# Patient Record
Sex: Male | Born: 1975 | Race: Black or African American | Hispanic: No | Marital: Single | Smoking: Current every day smoker
Health system: Southern US, Community
[De-identification: ages and names within clinical notes are randomized; demographics above are authoritative.]

## PROBLEM LIST (undated history)

## (undated) DIAGNOSIS — F32A Depression, unspecified: Secondary | ICD-10-CM

## (undated) DIAGNOSIS — N189 Chronic kidney disease, unspecified: Secondary | ICD-10-CM

## (undated) DIAGNOSIS — M7622 Iliac crest spur, left hip: Secondary | ICD-10-CM

## (undated) DIAGNOSIS — E119 Type 2 diabetes mellitus without complications: Secondary | ICD-10-CM

## (undated) DIAGNOSIS — Q613 Polycystic kidney, unspecified: Secondary | ICD-10-CM

## (undated) DIAGNOSIS — M1990 Unspecified osteoarthritis, unspecified site: Secondary | ICD-10-CM

## (undated) DIAGNOSIS — J939 Pneumothorax, unspecified: Secondary | ICD-10-CM

## (undated) DIAGNOSIS — I10 Essential (primary) hypertension: Secondary | ICD-10-CM

## (undated) DIAGNOSIS — G4730 Sleep apnea, unspecified: Secondary | ICD-10-CM

## (undated) HISTORY — DX: Pneumothorax, unspecified: J93.9

## (undated) HISTORY — DX: Chronic kidney disease, unspecified: N18.9

---

## 2005-12-05 ENCOUNTER — Emergency Department (HOSPITAL_COMMUNITY): Admission: EM | Admit: 2005-12-05 | Discharge: 2005-12-05 | Payer: Self-pay | Admitting: Emergency Medicine

## 2006-12-20 ENCOUNTER — Emergency Department (HOSPITAL_COMMUNITY): Admission: EM | Admit: 2006-12-20 | Discharge: 2006-12-20 | Payer: Self-pay | Admitting: Emergency Medicine

## 2013-01-21 ENCOUNTER — Encounter (HOSPITAL_COMMUNITY): Payer: Self-pay | Admitting: *Deleted

## 2013-01-21 ENCOUNTER — Emergency Department (HOSPITAL_COMMUNITY)
Admission: EM | Admit: 2013-01-21 | Discharge: 2013-01-21 | Disposition: A | Discharge status: Home / Self Care | Payer: BC Managed Care – PPO | Attending: Emergency Medicine | Admitting: Emergency Medicine

## 2013-01-21 ENCOUNTER — Emergency Department (HOSPITAL_COMMUNITY): Payer: BC Managed Care – PPO

## 2013-01-21 DIAGNOSIS — J069 Acute upper respiratory infection, unspecified: Secondary | ICD-10-CM

## 2013-01-21 DIAGNOSIS — R42 Dizziness and giddiness: Secondary | ICD-10-CM

## 2013-01-21 DIAGNOSIS — F17200 Nicotine dependence, unspecified, uncomplicated: Secondary | ICD-10-CM | POA: Insufficient documentation

## 2013-01-21 DIAGNOSIS — J939 Pneumothorax, unspecified: Secondary | ICD-10-CM

## 2013-01-21 DIAGNOSIS — J9383 Other pneumothorax: Secondary | ICD-10-CM | POA: Insufficient documentation

## 2013-01-21 LAB — CBC WITH DIFFERENTIAL/PLATELET
Basophils Absolute: 0 10*3/uL (ref 0.0–0.1)
MCHC: 36.1 g/dL — ABNORMAL HIGH (ref 30.0–36.0)
MCV: 89.3 fL (ref 78.0–100.0)
Monocytes Relative: 5 % (ref 3–12)
RBC: 4.38 MIL/uL (ref 4.22–5.81)
RDW: 13.6 % (ref 11.5–15.5)
WBC: 8.9 10*3/uL (ref 4.0–10.5)

## 2013-01-21 LAB — BASIC METABOLIC PANEL
CO2: 20 mEq/L (ref 19–32)
Chloride: 105 mEq/L (ref 96–112)
Creatinine, Ser: 1.39 mg/dL — ABNORMAL HIGH (ref 0.50–1.35)
GFR calc non Af Amer: 64 mL/min — ABNORMAL LOW (ref >90)

## 2013-01-21 MED ORDER — SODIUM CHLORIDE 0.9 % IV BOLUS (SEPSIS)
1000.0000 mL | Freq: Once | INTRAVENOUS | Status: AC
  Administered 2013-01-21: 1000 mL via INTRAVENOUS

## 2013-01-21 MED ORDER — MECLIZINE HCL 50 MG PO TABS: 50.0000 mg | ORAL_TABLET | Freq: Three times a day (TID) | ORAL | Status: DC | PRN

## 2013-01-21 MED ORDER — ONDANSETRON HCL 4 MG/2ML IJ SOLN
4.0000 mg | Freq: Once | INTRAMUSCULAR | Status: AC
  Administered 2013-01-21: 4 mg via INTRAVENOUS
  Filled 2013-01-21: qty 2

## 2013-01-21 MED ORDER — MECLIZINE HCL 25 MG PO TABS
25.0000 mg | ORAL_TABLET | Freq: Once | ORAL | Status: AC
  Administered 2013-01-21: 25 mg via ORAL
  Filled 2013-01-21: qty 1

## 2013-01-21 MED ORDER — ONDANSETRON HCL 4 MG PO TABS: 4.0000 mg | ORAL_TABLET | Freq: Four times a day (QID) | ORAL | Status: DC

## 2013-01-21 NOTE — ED Notes (Signed)
Pt is here with chest congestion and dizziness.  Pt states dizziness started when he woke up.   NO pain

## 2013-01-21 NOTE — ED Provider Notes (Signed)
TIME SEEN: 11:24 AM  CHIEF COMPLAINT: Lightheadedness, vertigo, chest congestion  HPI: Patient is a 37 year old male with history of tobacco use who presents emergency department with 2-3 days of cough with green sputum and one day of dizziness. Patient reports that he's had subjective fevers but no chills, night sweats. He has been coughing up green sputum for the past several days. No wheezing, shortness of breath or chest pain. He reports he woke up this morning and when he got out of bed he felt very lightheaded and like the room was spinning. He vomited once. He denies any ear pain, hearing loss, tinnitus. No other numbness, tingling or focal weakness. He reports his lightheadedness and vertigo are intermittent and worse with change of position.  No history of CAD, PE or DVT, prior stroke or TIA.  ROS: See HPI Constitutional: Subjective fever  Eyes: no drainage  ENT: no runny nose   Cardiovascular:  no chest pain  Resp: no SOB  GI: no vomiting GU: no dysuria Integumentary: no rash  Allergy: no hives  Musculoskeletal: no leg swelling  Neurological: no slurred speech ROS otherwise negative  PAST MEDICAL HISTORY/PAST SURGICAL HISTORY:  History reviewed. No pertinent past medical history.  MEDICATIONS:  Prior to Admission medications   Not on File    ALLERGIES:  No Known Allergies  SOCIAL HISTORY:  History  Substance Use Topics  . Smoking status: Current Every Day Smoker  . Smokeless tobacco: Not on file  . Alcohol Use: No    FAMILY HISTORY: No family history on file.  EXAM: BP 153/90  Pulse 65  Temp(Src) 98 F (36.7 C) (Oral)  Resp 18  SpO2 97% CONSTITUTIONAL: Alert and oriented and responds appropriately to questions. Well-appearing; well-nourished, nontoxic HEAD: Normocephalic EYES: Conjunctivae clear, PERRL ENT: normal nose; no rhinorrhea; slightly dry mucous membranes; pharynx without lesions noted, TMs are clear bilaterally NECK: Supple, no meningismus,  no LAD  CARD: RRR; S1 and S2 appreciated; no murmurs, no clicks, no rubs, no gallops RESP: Normal chest excursion without splinting or tachypnea; breath sounds clear and equal bilaterally; no wheezes, no rhonchi, no rales,  ABD/GI: Normal bowel sounds; non-distended; soft, non-tender, no rebound, no guarding BACK:  The back appears normal and is non-tender to palpation, there is no CVA tenderness EXT: Normal ROM in all joints; non-tender to palpation; no edema; normal capillary refill; no cyanosis    SKIN: Normal color for age and race; warm NEURO: Moves all extremities equally, strength 5/5 in all 4 extremities, normal gait, cranial nerves II through XII intact, sensation to light touch intact diffusely, no nystagmus PSYCH: The patient's mood and manner are appropriate. Grooming and personal hygiene are appropriate.  MEDICAL DECISION MAKING: Patient with chest congestion, cough, lightheadedness and vertigo. Likely all caused by a viral illness. No concern for central vertigo as patient has no risk factors and his symptoms are worse with change of position. He does not currently have any vertiginous symptoms. He is hemodynamically stable. Will obtain basic labs to evaluate for anemia or electrolyte abnormality as the cause of his lightheadedness, chest x-ray to evaluate for infiltrate, give IV fluids, meclizine and Zofran. Anticipate discharge home with PCP followup.   Date: 01/21/2013 11:11 AM  Rate: 76  Rhythm: normal sinus rhythm  QRS Axis: normal  Intervals: normal  ST/T Wave abnormalities: normal  Conduction Disutrbances: none  Narrative Interpretation: Poor R-wave progression, no ischemic changes     ED PROGRESS: Discussed with radiology. Patient has a small 3-4% apical  pneumothorax on the right. He has no hypoxia or respiratory distress. Will discuss with cardiothoracic surgery for management. Anticipate outpatient followup.  Discussed with Dr. Prescott Gum with cardiothoracic surgery.  He will followup with patient and likely repeat chest x-ray in 3 days. Given patient has normal labs, no hypoxia or respiratory distress, I feel he is safe for discharge. Is likely a viral illness. I do not feel he needs antibiotics at this time. Have counseled patient on tobacco cessation. Given strict return precautions. We'll discharge home with meclizine for his intermittent peripheral vertigo. Patient verbalizes understanding is comfortable this plan.  [place], DO 01/21/13 1320

## 2013-01-21 NOTE — ED Notes (Signed)
Pt comfortable with d/c and f/u instructions. Prescriptions x2. 

## 2013-01-21 NOTE — ED Notes (Signed)
Pt is coughing up green sputum

## 2013-01-24 ENCOUNTER — Institutional Professional Consult (permissible substitution) (INDEPENDENT_AMBULATORY_CARE_PROVIDER_SITE_OTHER): Payer: BC Managed Care – PPO | Admitting: Cardiothoracic Surgery

## 2013-01-24 ENCOUNTER — Other Ambulatory Visit: Payer: Self-pay | Admitting: *Deleted

## 2013-01-24 ENCOUNTER — Ambulatory Visit
Admission: RE | Admit: 2013-01-24 | Discharge: 2013-01-24 | Disposition: A | Discharge status: Home / Self Care | Payer: BC Managed Care – PPO | Source: Ambulatory Visit | Attending: Cardiothoracic Surgery | Admitting: Cardiothoracic Surgery

## 2013-01-24 ENCOUNTER — Encounter: Payer: Self-pay | Admitting: Cardiothoracic Surgery

## 2013-01-24 VITALS — BP 177/105 | HR 95 | Resp 16 | Ht 70.0 in | Wt 314.0 lb

## 2013-01-24 DIAGNOSIS — J9383 Other pneumothorax: Secondary | ICD-10-CM

## 2013-01-24 DIAGNOSIS — J939 Pneumothorax, unspecified: Secondary | ICD-10-CM

## 2013-01-24 NOTE — Progress Notes (Signed)
PCP is No PCP Per Patient Referring Provider is Ward, Delice Bison, DO  Chief Complaint  Patient presents with  . Spontaneous Pneumothorax    right...f/u with cxr    HPI: 37 year old smoker returns for office followup after being evaluated earlier in the week for a spontaneous left pneumothorax less than 5%. He presented to the ED with complaints of a URI. No previous history of spontaneous pneumothorax. He smokes a pack a day. No history of drastic trauma.    No past surgical history on file.  No family history on file.  Social History History  Substance Use Topics  . Smoking status: Current Every Day Smoker  . Smokeless tobacco: Not on file  . Alcohol Use: No    Current Outpatient Prescriptions  Medication Sig Dispense Refill  . meclizine (ANTIVERT) 50 MG tablet Take 1 tablet (50 mg total) by mouth 3 (three) times daily as needed.  15 tablet  0  . ondansetron (ZOFRAN) 4 MG tablet Take 1 tablet (4 mg total) by mouth every 6 (six) hours.  12 tablet  0   No current facility-administered medications for this visit.    No Known Allergies  Review of Systems symptoms of URI are improving  BP 177/105  Pulse 95  Resp 16  Ht 5\' 10"  (1.778 m)  Wt 314 lb (142.429 kg)  BMI 45.05 kg/m2  SpO2 98% Physical Exam Alert and comfortable No crepitus in the neck Breath sounds clear and equal No chest tenderness Cardiac rhythm regular without murmur Neuro intact  Diagnostic Tests: Chest x-ray today shows resolution of left apical pneumothorax  Impression: Resolved left spontaneous apical pneumothorax  Plan: Patient was told not to do any heavy lifting or any exertional activity for one week. Patient was recommended to stop smoking completely. Return when necessary.

## 2016-02-28 ENCOUNTER — Observation Stay (HOSPITAL_COMMUNITY)
Admission: EM | Admit: 2016-02-28 | Discharge: 2016-02-29 | Disposition: A | Discharge status: Home / Self Care | Payer: 59 | Attending: Internal Medicine | Admitting: Internal Medicine

## 2016-02-28 ENCOUNTER — Encounter (HOSPITAL_COMMUNITY): Payer: Self-pay | Admitting: Emergency Medicine

## 2016-02-28 ENCOUNTER — Emergency Department (HOSPITAL_COMMUNITY): Payer: 59

## 2016-02-28 DIAGNOSIS — E785 Hyperlipidemia, unspecified: Secondary | ICD-10-CM | POA: Diagnosis not present

## 2016-02-28 DIAGNOSIS — Z79899 Other long term (current) drug therapy: Secondary | ICD-10-CM | POA: Diagnosis not present

## 2016-02-28 DIAGNOSIS — I129 Hypertensive chronic kidney disease with stage 1 through stage 4 chronic kidney disease, or unspecified chronic kidney disease: Secondary | ICD-10-CM | POA: Diagnosis not present

## 2016-02-28 DIAGNOSIS — F17210 Nicotine dependence, cigarettes, uncomplicated: Secondary | ICD-10-CM | POA: Diagnosis not present

## 2016-02-28 DIAGNOSIS — R079 Chest pain, unspecified: Secondary | ICD-10-CM | POA: Diagnosis present

## 2016-02-28 DIAGNOSIS — N189 Chronic kidney disease, unspecified: Secondary | ICD-10-CM | POA: Diagnosis not present

## 2016-02-28 DIAGNOSIS — I161 Hypertensive emergency: Secondary | ICD-10-CM

## 2016-02-28 DIAGNOSIS — Z6841 Body Mass Index (BMI) 40.0 and over, adult: Secondary | ICD-10-CM | POA: Insufficient documentation

## 2016-02-28 DIAGNOSIS — Z720 Tobacco use: Secondary | ICD-10-CM | POA: Diagnosis not present

## 2016-02-28 DIAGNOSIS — R0789 Other chest pain: Principal | ICD-10-CM | POA: Insufficient documentation

## 2016-02-28 DIAGNOSIS — N179 Acute kidney failure, unspecified: Secondary | ICD-10-CM | POA: Insufficient documentation

## 2016-02-28 DIAGNOSIS — I10 Essential (primary) hypertension: Secondary | ICD-10-CM

## 2016-02-28 HISTORY — DX: Essential (primary) hypertension: I10

## 2016-02-28 LAB — BASIC METABOLIC PANEL
Anion gap: 7 (ref 5–15)
BUN: 17 mg/dL (ref 6–20)
CALCIUM: 9.5 mg/dL (ref 8.9–10.3)
CO2: 22 mmol/L (ref 22–32)
CREATININE: 2.21 mg/dL — AB (ref 0.61–1.24)
Chloride: 111 mmol/L (ref 101–111)
GFR calc Af Amer: 41 mL/min — ABNORMAL LOW (ref >60)
GFR, EST NON AFRICAN AMERICAN: 35 mL/min — AB (ref >60)
GLUCOSE: 105 mg/dL — AB (ref 65–99)
Potassium: 4.1 mmol/L (ref 3.5–5.1)
SODIUM: 140 mmol/L (ref 135–145)

## 2016-02-28 LAB — CBC
HCT: 42.1 % (ref 39.0–52.0)
Hemoglobin: 14.5 g/dL (ref 13.0–17.0)
MCH: 30.6 pg (ref 26.0–34.0)
MCHC: 34.4 g/dL (ref 30.0–36.0)
MCV: 88.8 fL (ref 78.0–100.0)
PLATELETS: 255 10*3/uL (ref 150–400)
RBC: 4.74 MIL/uL (ref 4.22–5.81)
RDW: 13.8 % (ref 11.5–15.5)
WBC: 6.9 10*3/uL (ref 4.0–10.5)

## 2016-02-28 LAB — TROPONIN I
Troponin I: <0.03 ng/mL (ref <0.03)
Troponin I: <0.03 ng/mL (ref <0.03)

## 2016-02-28 LAB — GLUCOSE, CAPILLARY: Glucose-Capillary: 118 mg/dL — ABNORMAL HIGH (ref 65–99)

## 2016-02-28 LAB — I-STAT TROPONIN, ED
TROPONIN I, POC: 0.01 ng/mL (ref 0.00–0.08)
Troponin i, poc: 0.01 ng/mL (ref 0.00–0.08)

## 2016-02-28 MED ORDER — AMLODIPINE BESYLATE 5 MG PO TABS
10.0000 mg | ORAL_TABLET | Freq: Once | ORAL | Status: AC
  Administered 2016-02-28: 10 mg via ORAL
  Filled 2016-02-28: qty 2

## 2016-02-28 MED ORDER — ASPIRIN 81 MG PO CHEW
324.0000 mg | CHEWABLE_TABLET | Freq: Once | ORAL | Status: AC
  Administered 2016-02-28: 324 mg via ORAL
  Filled 2016-02-28: qty 4

## 2016-02-28 MED ORDER — SODIUM CHLORIDE 0.9 % IV BOLUS (SEPSIS)
1000.0000 mL | Freq: Once | INTRAVENOUS | Status: AC
  Administered 2016-02-28: 1000 mL via INTRAVENOUS

## 2016-02-28 MED ORDER — NICOTINE 21 MG/24HR TD PT24
21.0000 mg | MEDICATED_PATCH | Freq: Every day | TRANSDERMAL | Status: DC
  Administered 2016-02-28 – 2016-02-29 (×2): 21 mg via TRANSDERMAL
  Filled 2016-02-28 (×2): qty 1

## 2016-02-28 MED ORDER — ZOLPIDEM TARTRATE 5 MG PO TABS: 5.0000 mg | ORAL_TABLET | Freq: Every evening | ORAL | Status: DC | PRN

## 2016-02-28 MED ORDER — ACETAMINOPHEN 325 MG PO TABS
650.0000 mg | ORAL_TABLET | ORAL | Status: DC | PRN
  Administered 2016-02-28 (×2): 650 mg via ORAL
  Filled 2016-02-28 (×2): qty 2

## 2016-02-28 MED ORDER — ONDANSETRON HCL 4 MG/2ML IJ SOLN: 4.0000 mg | Freq: Four times a day (QID) | INTRAMUSCULAR | Status: DC | PRN

## 2016-02-28 MED ORDER — HYDRALAZINE HCL 20 MG/ML IJ SOLN: 5.0000 mg | Freq: Once | INTRAMUSCULAR | Status: AC

## 2016-02-28 MED ORDER — SODIUM CHLORIDE 0.9 % IV SOLN
INTRAVENOUS | Status: DC
  Administered 2016-02-28 – 2016-02-29 (×2): via INTRAVENOUS

## 2016-02-28 MED ORDER — HEPARIN SODIUM (PORCINE) 5000 UNIT/ML IJ SOLN
5000.0000 [IU] | Freq: Three times a day (TID) | INTRAMUSCULAR | Status: DC
  Administered 2016-02-28 – 2016-02-29 (×3): 5000 [IU] via SUBCUTANEOUS
  Filled 2016-02-28 (×3): qty 1

## 2016-02-28 MED ORDER — ALPRAZOLAM 0.25 MG PO TABS: 0.2500 mg | ORAL_TABLET | Freq: Two times a day (BID) | ORAL | Status: DC | PRN

## 2016-02-28 MED ORDER — HYDRALAZINE HCL 20 MG/ML IJ SOLN: 5.0000 mg | Freq: Three times a day (TID) | INTRAMUSCULAR | Status: DC | PRN

## 2016-02-28 MED ORDER — MORPHINE SULFATE (PF) 2 MG/ML IV SOLN
1.0000 mg | INTRAVENOUS | Status: DC | PRN
  Administered 2016-02-28: 1 mg via INTRAVENOUS
  Filled 2016-02-28: qty 1

## 2016-02-28 MED ORDER — GI COCKTAIL ~~LOC~~: 30.0000 mL | Freq: Four times a day (QID) | ORAL | Status: DC | PRN

## 2016-02-28 MED ORDER — NITROGLYCERIN 2 % TD OINT
1.0000 [in_us] | TOPICAL_OINTMENT | Freq: Once | TRANSDERMAL | Status: AC
  Administered 2016-02-28: 1 [in_us] via TOPICAL
  Filled 2016-02-28: qty 1

## 2016-02-28 NOTE — ED Notes (Signed)
Lab at bedside

## 2016-02-28 NOTE — ED Notes (Signed)
phlebotomy at bedside attempting to get blood.

## 2016-02-28 NOTE — H&P (Signed)
History and Physical    Kevin Todd DOB: 06/03/1975 DOA: 02/28/2016   PCP: No PCP Per Patient   Patient coming from:  Home  Chief Complaint: Chest Pain   HPI: Kevin Todd is a 40 y.o. male with medical history significant for cocaine abuse, HTN,  presenting with left parasternal chest pain since 9  Am, without radiation, 6/10. No radiation to the jaw or left arm.Pain notworsened with deep inspiration, movement or exertion. Till presentation to the ED, he had 1 episode without recurrence.  Patient took ASA 324 mg on arrival, and then given NTG x1 with some relief.  Denies any dizziness or falls. No syncope or presyncope.  Reports some acute on chronic dyspnea, worse on exertion. Denies any cough. Denies any fever or chills. Denies any nausea, vomiting or abdominal pain. Appetite is normal and eats salt rich, fatty foods. Denies any leg swelling or calf pain. Denies any headaches or vision changes. Denies any seizures No confusion reported.  Never seen by a cardiologist or had a cath. He does not have a family doctor. Only significant hearddisease history in his paternal grandfather, No recent long distance trips. Denies any new stressors. No new meds. Not on hormonal therapy.  No new herbal supplements.Smokes 1/2 ppd cigarettes. Denies cocaine or meth. Drinks socially.    ED Course:  BP 127/88   Pulse 72   Temp 98 F (36.7 C) (Oral)   Resp 20   Ht 5' 8"  (1.727 m)   Wt 134.7 kg (297 lb)   SpO2 91%   BMI 45.16 kg/m      Review of Systems: As per HPI otherwise 10 point review of systems negative.   Past Medical History:  Diagnosis Date  . Hypertension   . Pneumothorax on right     History reviewed. No pertinent surgical history.  Social History Social History   Social History  . Marital status: Single    Spouse name: N/A  . Number of children: N/A  . Years of education: N/A   Occupational History  . Not on file.   Social History Main Topics  .  Smoking status: Current Every Day Smoker  . Smokeless tobacco: Never Used  . Alcohol use Yes  . Drug use: No  . Sexual activity: Not on file   Other Topics Concern  . Not on file   Social History Narrative  . No narrative on file     No Known Allergies  History reviewed. No pertinent family history.    Prior to Admission medications   Medication Sig Start Date End Date Taking? Authorizing Provider  meclizine (ANTIVERT) 50 MG tablet Take 1 tablet (50 mg total) by mouth 3 (three) times daily as needed. Patient not taking: Reported on 02/28/2016 01/21/13   Kristen N Ward, DO  ondansetron (ZOFRAN) 4 MG tablet Take 1 tablet (4 mg total) by mouth every 6 (six) hours. Patient not taking: Reported on 02/28/2016 01/21/13   Delice Bison Ward, DO    Physical Exam:    Vitals:   02/28/16 1345 02/28/16 1415 02/28/16 1430 02/28/16 1445  BP: 133/92 133/70 121/79 127/88  Pulse: 61 66 83 72  Resp: 18 16 20 20   Temp:      TempSrc:      SpO2: (!) 87% 94% (!) 88% 91%  Weight:      Height:           Constitutional: NAD, calm, comfortable  Vitals:   02/28/16 1345 02/28/16  1415 02/28/16 1430 02/28/16 1445  BP: 133/92 133/70 121/79 127/88  Pulse: 61 66 83 72  Resp: 18 16 20 20   Temp:      TempSrc:      SpO2: (!) 87% 94% (!) 88% 91%  Weight:      Height:       Eyes: PERRL, lids and conjunctivae normal ENMT: Mucous membranes are moist. Posterior pharynx clear of any exudate or lesions.Normal dentition.  Neck: normal, supple, no masses, no thyromegaly Respiratory: clear to auscultation bilaterally, no wheezing, no crackles. Normal respiratory effort. No accessory muscle use.  Cardiovascular: Regular rate and rhythm, soft 1/6 systolic murmurs / rubs / gallops. Trace lower bilateral  extremity edema. 2+ pedal pulses. No carotid bruits.  Abdomen: morbidly obese no tenderness, no masses palpated. No hepatosplenomegaly. Bowel sounds positive.  Musculoskeletal: no clubbing / cyanosis. No joint  deformity upper and lower extremities. Good ROM, no contractures. Normal muscle tone.  Skin: no rashes, lesions, ulcers.  Neurologic: CN 2-12 grossly intact. Sensation intact, DTR normal. Strength 5/5 in all 4.  Psychiatric: Normal judgment and insight. Alert and oriented x 3. Normal mood.     Labs on Admission: I have personally reviewed following labs and imaging studies  CBC:  Recent Labs Lab 02/28/16 0821  WBC 6.9  HGB 14.5  HCT 42.1  MCV 88.8  PLT 010    Basic Metabolic Panel:  Recent Labs Lab 02/28/16 0821  NA 140  K 4.1  CL 111  CO2 22  GLUCOSE 105*  BUN 17  CREATININE 2.21*  CALCIUM 9.5    GFR: Estimated Creatinine Clearance: 59.6 mL/min (by C-G formula based on SCr of 2.21 mg/dL (H)).  Liver Function Tests: No results for input(s): AST, ALT, ALKPHOS, BILITOT, PROT, ALBUMIN in the last 168 hours. No results for input(s): LIPASE, AMYLASE in the last 168 hours. No results for input(s): AMMONIA in the last 168 hours.  Coagulation Profile: No results for input(s): INR, PROTIME in the last 168 hours.  Cardiac Enzymes: No results for input(s): CKTOTAL, CKMB, CKMBINDEX, TROPONINI in the last 168 hours.  BNP (last 3 results) No results for input(s): PROBNP in the last 8760 hours.  HbA1C: No results for input(s): HGBA1C in the last 72 hours.  CBG: No results for input(s): GLUCAP in the last 168 hours.  Lipid Profile: No results for input(s): CHOL, HDL, LDLCALC, TRIG, CHOLHDL, LDLDIRECT in the last 72 hours.  Thyroid Function Tests: No results for input(s): TSH, T4TOTAL, FREET4, T3FREE, THYROIDAB in the last 72 hours.  Anemia Panel: No results for input(s): VITAMINB12, FOLATE, FERRITIN, TIBC, IRON, RETICCTPCT in the last 72 hours.  Urine analysis: No results found for: COLORURINE, APPEARANCEUR, LABSPEC, PHURINE, GLUCOSEU, HGBUR, BILIRUBINUR, KETONESUR, PROTEINUR, UROBILINOGEN, NITRITE, LEUKOCYTESUR  Sepsis  Labs: @LABRCNTIP (procalcitonin:4,lacticidven:4) )No results found for this or any previous visit (from the past 240 hour(s)).   Radiological Exams on Admission: Dg Chest 2 View  Result Date: 02/28/2016 CLINICAL DATA:  Chest pain.  Shortness of breath . EXAM: CHEST  2 VIEW COMPARISON:  01/24/2013. FINDINGS: The heart size and mediastinal contours are within normal limits. Both lungs are clear. No acute bony abnormality. IMPRESSION: No active cardiopulmonary disease. Electronically Signed   By: Marcello Moores  Register   On: 02/28/2016 08:37    EKG: Independently reviewed.  Assessment/Plan Active Problems:   Chest pain   Acute kidney injury ([facility])   Morbid obesity ([facility])   Tobacco abuse     Chest pain syndrome  cardiac versus musculoskeletal  HEART score 3-4. Troponin 0.03, EKG without evidence of ACS. CP relieved by nitroglycerin x1,  Received aspirin x1. CXR unrevealing.  He is CP free at this time   Admit to Telemetry/ Observation Chest pain order set Cycle troponins EKG in am continue ASA, O2 and NTG as needed GI cocktail Check Lipid panel  Hb A1C Will need OP follow up when discharged, no PCP .Once labs return, including A1C and Lipid panel, will likely need to start on meds    AKI versus Acute on Chronic kidney disease stage 3  EGFR 41   Current Cr 2.21, unable to obtain a baseline Cr. Last lab value on 01/2013 1.39  Lab Results  Component Value Date   CREATININE 2.21 (H) 02/28/2016   CREATININE 1.39 (H) 01/21/2013  IVF Repeat CMET in am  Tobacco abuse  -  Counseled cessation PAtient declines patch at this time    DVT prophylaxis:  Heparin  Code Status:   Full   Family Communication:  Discussed with patient    Disposition Plan: Expect patient to be discharged to home after condition improves Consults called:    None Admission status:Tele  Obs  Mayes Sangiovanni E, PA-C [facility]   If 7PM-7AM, please contact night-coverage [email or web address] Password  [email or web address]  02/28/2016, 2:55 PM

## 2016-02-28 NOTE — ED Triage Notes (Signed)
Pt states he woke up with chest pressure. Pt denies N/V. Pt is feeling lightheaded.

## 2016-02-28 NOTE — ED Notes (Signed)
RN attempted to place IV since patient removed unsuccessful x2

## 2016-02-28 NOTE — ED Notes (Signed)
Spoke with PA about Hydralazine advised hold at this time.

## 2016-02-28 NOTE — ED Provider Notes (Signed)
[facility] DEPT Provider Note   CSN: [id number] Arrival date & time: 02/28/16  0803     History   Chief Complaint Chief Complaint  Patient presents with  . Chest Pain    HPI  Kevin Todd is a 40 y.o. male c/o Sided nonradiating chest pain described as tightness, waxing and waning onset this morning while he was at rest, 7 out of 10 at worst, 4 out of 10 right now. Denies exacerbation with exertion, nonpleuritic, non-positional. Associated with mild nausea, no emesis, diaphoresis, lightheadedness, syncope, calf pain, leg swelling, recent cocaine and methamphetamine use, family history of ACS or early cardiac death, cough, fever, chills, history of DVT/PE, recent immobilizations, history of cancer.   Pt has not received any ASA or NTG in the last 24 hours.  RF: Current smoker with 20-pack-year history, obesity, untreated hypertension, no primary care unknown diabetes, hyperlipidemia Cardiologist:  ? PCP: ?   HPI  Past Medical History:  Diagnosis Date  . Hypertension   . Pneumothorax on right     Patient Active Problem List   Diagnosis Date Noted  . Chest pain 02/28/2016  . Spontaneous pneumothorax 01/24/2013    History reviewed. No pertinent surgical history.     Home Medications    Prior to Admission medications   Medication Sig Start Date End Date Taking? Authorizing Provider  meclizine (ANTIVERT) 50 MG tablet Take 1 tablet (50 mg total) by mouth 3 (three) times daily as needed. Patient not taking: Reported on 02/28/2016 01/21/13   Kristen N Ward, DO  ondansetron (ZOFRAN) 4 MG tablet Take 1 tablet (4 mg total) by mouth every 6 (six) hours. Patient not taking: Reported on 02/28/2016 01/21/13   [place], DO    Family History History reviewed. No pertinent family history.  Social History Social History  Substance Use Topics  . Smoking status: Current Every Day Smoker  . Smokeless tobacco: Never Used  . Alcohol use Yes     Allergies   Review of  patient's allergies indicates no known allergies.   Review of Systems Review of Systems  10 systems reviewed and found to be negative, except as noted in the HPI.   Physical Exam Updated Vital Signs BP 141/77   Pulse 61   Temp 98 F (36.7 C) (Oral)   Resp 17   Ht 5\' 8"  (1.727 m)   Wt 134.7 kg   SpO2 97%   BMI 45.16 kg/m   Physical Exam  Constitutional: He is oriented to person, place, and time. He appears well-developed and well-nourished. No distress.  HENT:  Head: Normocephalic and atraumatic.  Mouth/Throat: Oropharynx is clear and moist.  Eyes: Conjunctivae and EOM are normal. Pupils are equal, round, and reactive to light.  Neck: Normal range of motion. No JVD present. No tracheal deviation present.  Cardiovascular: Normal rate, regular rhythm and intact distal pulses.   BP 165/116  Radial pulse equal bilaterally  Pulmonary/Chest: Effort normal and breath sounds normal. No stridor. No respiratory distress. He has no wheezes. He has no rales. He exhibits no tenderness.  Abdominal: Soft. He exhibits no distension and no mass. There is no tenderness. There is no rebound and no guarding.  Musculoskeletal: Normal range of motion. He exhibits no edema or tenderness.  No calf asymmetry, superficial collaterals, palpable cords, edema, Homans sign negative bilaterally.    Neurological: He is alert and oriented to person, place, and time. Abnormal reflex: .npmdm.  Skin: Skin is warm. He is not diaphoretic.  Psychiatric: He has a normal mood and affect.  Nursing note and vitals reviewed.    ED Treatments / Results  Labs (all labs ordered are listed, but only abnormal results are displayed) Labs Reviewed  BASIC METABOLIC PANEL - Abnormal; Notable for the following:       Result Value   Glucose, Bld 105 (*)    Creatinine, Ser 2.21 (*)    GFR calc non Af Amer 35 (*)    GFR calc Af Amer 41 (*)    All other components within normal limits  CBC  I-STAT TROPOININ, ED    I-STAT TROPOININ, ED    EKG  EKG Interpretation  Date/Time:  Monday February 28 2016 08:10:33 EDT Ventricular Rate:  86 PR Interval:  132 QRS Duration: 80 QT Interval:  346 QTC Calculation: 414 R Axis:   105 Text Interpretation:  Sinus rhythm with marked sinus arrhythmia Rightward axis No significant change since last tracing Confirmed by Alvino Chapel  MD, NATHAN [phone]) on 02/28/2016 10:20:47 AM       Radiology Dg Chest 2 View  Result Date: 02/28/2016 CLINICAL DATA:  Chest pain.  Shortness of breath . EXAM: CHEST  2 VIEW COMPARISON:  01/24/2013. FINDINGS: The heart size and mediastinal contours are within normal limits. Both lungs are clear. No acute bony abnormality. IMPRESSION: No active cardiopulmonary disease. Electronically Signed   By: Marcello Moores  Register   On: 02/28/2016 08:37    Procedures Procedures (including critical care time)  Medications Ordered in ED Medications  nicotine (NICODERM CQ - dosed in mg/24 hours) patch 21 mg (21 mg Transdermal Patch Applied 02/28/16 1154)  hydrALAZINE (APRESOLINE) injection 5 mg (not administered)  nitroGLYCERIN (NITROGLYN) 2 % ointment 1 inch (1 inch Topical Given 02/28/16 1118)  aspirin chewable tablet 324 mg (324 mg Oral Given 02/28/16 1117)  amLODipine (NORVASC) tablet 10 mg (10 mg Oral Given 02/28/16 1117)  sodium chloride 0.9 % bolus 1,000 mL (0 mLs Intravenous Stopped 02/28/16 1243)     Initial Impression / Assessment and Plan / ED Course  I have reviewed the triage vital signs and the nursing notes.  Pertinent labs & imaging results that were available during my care of the patient were reviewed by me and considered in my medical decision making (see chart for details).  Clinical Course   Vitals:   02/28/16 1200 02/28/16 1215 02/28/16 1230 02/28/16 1245  BP: 140/87 141/85 134/92 141/77  Pulse: (!) 55 (!) 58 60 61  Resp: 17 19 16 17   Temp:      TempSrc:      SpO2: 96% 96% 97% 97%  Weight:      Height:         Medications  nicotine (NICODERM CQ - dosed in mg/24 hours) patch 21 mg (21 mg Transdermal Patch Applied 02/28/16 1154)  hydrALAZINE (APRESOLINE) injection 5 mg (not administered)  nitroGLYCERIN (NITROGLYN) 2 % ointment 1 inch (1 inch Topical Given 02/28/16 1118)  aspirin chewable tablet 324 mg (324 mg Oral Given 02/28/16 1117)  amLODipine (NORVASC) tablet 10 mg (10 mg Oral Given 02/28/16 1117)  sodium chloride 0.9 % bolus 1,000 mL (0 mLs Intravenous Stopped 02/28/16 1243)    Kevin Todd is 40 y.o. male presenting with Left-sided nonradiating chest pain described as tightness, intermittent waxing and waning in no exacerbating or alleviating factors identified. Patient's blood pressure is elevated, does not have a primary care physician, multiple cardiac risk factors. EKG reassuring with just a right axis deviation, troponin negative, creatinine  2.2, no recent for comparison, may be secondary to chronic uncontrolled hypertension versus a hypertensive urgency with end organ damage. Will obtain delta troponin, give amlodipine for blood pressure control and reassess. Extensive discussion on smoking cessation, lifestyle changes and importance of following with PCP for hypertension. This patient is insured, recommend that he see [facility].  Patient's chest pain remains 5/10 will need admission for possible end organ damage from elevated blood pressure.  Discussed with APP [facility] who accepts admission with Dr. Marily Memos.   Final Clinical Impressions(s) / ED Diagnoses   Final diagnoses:  Chest pain, unspecified type  Uncontrolled hypertension  Tobacco use  Chronic kidney disease, unspecified CKD stage    New Prescriptions New Prescriptions   No medications on file     Monico Blitz, PA-C 02/28/16 [place], MD 02/28/16 1610

## 2016-02-29 ENCOUNTER — Observation Stay (HOSPITAL_BASED_OUTPATIENT_CLINIC_OR_DEPARTMENT_OTHER): Payer: 59

## 2016-02-29 DIAGNOSIS — F17210 Nicotine dependence, cigarettes, uncomplicated: Secondary | ICD-10-CM | POA: Diagnosis not present

## 2016-02-29 DIAGNOSIS — N189 Chronic kidney disease, unspecified: Secondary | ICD-10-CM | POA: Diagnosis not present

## 2016-02-29 DIAGNOSIS — I200 Unstable angina: Secondary | ICD-10-CM

## 2016-02-29 DIAGNOSIS — R079 Chest pain, unspecified: Secondary | ICD-10-CM

## 2016-02-29 DIAGNOSIS — N179 Acute kidney failure, unspecified: Secondary | ICD-10-CM | POA: Diagnosis not present

## 2016-02-29 DIAGNOSIS — R0789 Other chest pain: Secondary | ICD-10-CM | POA: Diagnosis not present

## 2016-02-29 DIAGNOSIS — I129 Hypertensive chronic kidney disease with stage 1 through stage 4 chronic kidney disease, or unspecified chronic kidney disease: Secondary | ICD-10-CM | POA: Diagnosis not present

## 2016-02-29 LAB — RAPID URINE DRUG SCREEN, HOSP PERFORMED
Amphetamines: NOT DETECTED
BENZODIAZEPINES: NOT DETECTED
Barbiturates: NOT DETECTED
COCAINE: NOT DETECTED
OPIATES: NOT DETECTED
TETRAHYDROCANNABINOL: POSITIVE — AB

## 2016-02-29 LAB — COMPREHENSIVE METABOLIC PANEL
ALT: 22 U/L (ref 17–63)
AST: 19 U/L (ref 15–41)
Albumin: 3.8 g/dL (ref 3.5–5.0)
Alkaline Phosphatase: 58 U/L (ref 38–126)
Anion gap: 7 (ref 5–15)
BILIRUBIN TOTAL: 0.7 mg/dL (ref 0.3–1.2)
BUN: 13 mg/dL (ref 6–20)
CO2: 22 mmol/L (ref 22–32)
CREATININE: 1.96 mg/dL — AB (ref 0.61–1.24)
Calcium: 9.3 mg/dL (ref 8.9–10.3)
Chloride: 111 mmol/L (ref 101–111)
GFR, EST AFRICAN AMERICAN: 48 mL/min — AB (ref >60)
GFR, EST NON AFRICAN AMERICAN: 41 mL/min — AB (ref >60)
Glucose, Bld: 119 mg/dL — ABNORMAL HIGH (ref 65–99)
Potassium: 4.1 mmol/L (ref 3.5–5.1)
Sodium: 140 mmol/L (ref 135–145)
TOTAL PROTEIN: 6.7 g/dL (ref 6.5–8.1)

## 2016-02-29 LAB — ECHOCARDIOGRAM COMPLETE
Height: 68 in
Weight: 4752 oz

## 2016-02-29 LAB — TSH: TSH: 1.95 u[IU]/mL (ref 0.350–4.500)

## 2016-02-29 LAB — LIPID PANEL
CHOLESTEROL: 211 mg/dL — AB (ref 0–200)
HDL: 33 mg/dL — ABNORMAL LOW (ref >40)
LDL Cholesterol: 146 mg/dL — ABNORMAL HIGH (ref 0–99)
Total CHOL/HDL Ratio: 6.4 RATIO
Triglycerides: 162 mg/dL — ABNORMAL HIGH (ref <150)
VLDL: 32 mg/dL (ref 0–40)

## 2016-02-29 LAB — HEMOGLOBIN A1C
Hgb A1c MFr Bld: 5.8 % — ABNORMAL HIGH (ref 4.8–5.6)
MEAN PLASMA GLUCOSE: 120 mg/dL

## 2016-02-29 LAB — LIPASE, BLOOD: LIPASE: 27 U/L (ref 11–51)

## 2016-02-29 LAB — D-DIMER, QUANTITATIVE (NOT AT ARMC)

## 2016-02-29 MED ORDER — AMLODIPINE BESYLATE 10 MG PO TABS: 10.0000 mg | ORAL_TABLET | Freq: Every day | ORAL | 1 refills | Status: DC

## 2016-02-29 MED ORDER — NICOTINE 21 MG/24HR TD PT24: 21.0000 mg | MEDICATED_PATCH | Freq: Every day | TRANSDERMAL | 0 refills | Status: DC

## 2016-02-29 MED ORDER — ATORVASTATIN CALCIUM 20 MG PO TABS: 20.0000 mg | ORAL_TABLET | Freq: Every day | ORAL | 1 refills | Status: DC

## 2016-02-29 NOTE — Discharge Summary (Signed)
Physician Discharge Summary  Kevin Todd MRN: [id number] DOB/AGE: 05/30/1975 40 y.o.  PCP: No PCP Per Patient   Admit date: 02/28/2016 Discharge date: 02/29/2016  Discharge Diagnoses:    Active Problems:   Chest pain   Acute kidney injury ([place])   Morbid obesity ([place])   Tobacco abuse   Hypertensive emergency    Follow-up recommendations Follow-up with PCP in 3-5 days , including all  additional recommended appointments as below Follow-up CBC, CMP in 3-5 days Patient to establish with primary care provider , closely follow renal function in the outpatient setting       Current Discharge Medication List    START taking these medications   Details  amLODipine (NORVASC) 10 MG tablet Take 1 tablet (10 mg total) by mouth daily. Qty: 30 tablet, Refills: 1    atorvastatin (LIPITOR) 20 MG tablet Take 1 tablet (20 mg total) by mouth daily. Qty: 30 tablet, Refills: 1    nicotine (NICODERM CQ - DOSED IN MG/24 HOURS) 21 mg/24hr patch Place 1 patch (21 mg total) onto the skin daily. Qty: 28 patch, Refills: 0      CONTINUE these medications which have NOT CHANGED   Details  meclizine (ANTIVERT) 50 MG tablet Take 1 tablet (50 mg total) by mouth 3 (three) times daily as needed. Qty: 15 tablet, Refills: 0    ondansetron (ZOFRAN) 4 MG tablet Take 1 tablet (4 mg total) by mouth every 6 (six) hours. Qty: 12 tablet, Refills: 0         Discharge Condition: *Stable   Discharge Instructions Get Medicines reviewed and adjusted: Please take all your medications with you for your next visit with your Primary MD  Please request your Primary MD to go over all hospital tests and procedure/radiological results at the follow up, please ask your Primary MD to get all Hospital records sent to his/her office.  If you experience worsening of your admission symptoms, develop shortness of breath, life threatening emergency, suicidal or homicidal thoughts you must seek medical attention  immediately by calling 911 or calling your MD immediately if symptoms less severe.  You must read complete instructions/literature along with all the possible adverse reactions/side effects for all the Medicines you take and that have been prescribed to you. Take any new Medicines after you have completely understood and accpet all the possible adverse reactions/side effects.   Do not drive when taking Pain medications.   Do not take more than prescribed Pain, Sleep and Anxiety Medications  Special Instructions: If you have smoked or chewed Tobacco in the last 2 yrs please stop smoking, stop any regular Alcohol and or any Recreational drug use.  Wear Seat belts while driving.  Please note  You were cared for by a hospitalist during your hospital stay. Once you are discharged, your primary care physician will handle any further medical issues. Please note that NO REFILLS for any discharge medications will be authorized once you are discharged, as it is imperative that you return to your primary care physician (or establish a relationship with a primary care physician if you do not have one) for your aftercare needs so that they can reassess your need for medications and monitor your lab values.     No Known Allergies    Disposition: 01-Home or Self Care   Consults:  None     Significant Diagnostic Studies:  Dg Chest 2 View  Result Date: 02/28/2016 CLINICAL DATA:  Chest pain.  Shortness of breath . EXAM:  CHEST  2 VIEW COMPARISON:  01/24/2013. FINDINGS: The heart size and mediastinal contours are within normal limits. Both lungs are clear. No acute bony abnormality. IMPRESSION: No active cardiopulmonary disease. Electronically Signed   By: Maisie Fus  Register   On: 02/28/2016 08:37    echocardiogram  LV EF: 60% -   65%  ------------------------------------------------------------------- Indications:      Chest pain  786.51.  ------------------------------------------------------------------- History:   PMH:  Substance Abuse.  Risk factors:  Hypertension.  ------------------------------------------------------------------- Study Conclusions  - Left ventricle: The cavity size was normal. There was severe   focal basal hypertrophy. Systolic function was normal. The   estimated ejection fraction was in the range of 60% to 65%. Wall   motion was normal; there were no regional wall motion   abnormalities. Left ventricular diastolic function parameters   were normal. - Mitral valve: Calcified annulus. - Pulmonic valve: There was trivial regurgitation.     Filed Weights   02/28/16 0810 02/28/16 1552  Weight: 134.7 kg (297 lb) 134.7 kg (297 lb)     Microbiology: No results found for this or any previous visit (from the past 240 hour(s)).     Blood Culture No results found for: SDES, SPECREQUEST, CULT, REPTSTATUS    Labs: Results for orders placed or performed during the hospital encounter of 02/28/16 (from the past 48 hour(s))  Urine rapid drug screen (hosp performed)     Status: Abnormal   Collection Time: 02/28/16  3:15 AM  Result Value Ref Range   Opiates NONE DETECTED NONE DETECTED   Cocaine NONE DETECTED NONE DETECTED   Benzodiazepines NONE DETECTED NONE DETECTED   Amphetamines NONE DETECTED NONE DETECTED   Tetrahydrocannabinol POSITIVE (A) NONE DETECTED   Barbiturates NONE DETECTED NONE DETECTED    Comment:        DRUG SCREEN FOR MEDICAL PURPOSES ONLY.  IF CONFIRMATION IS NEEDED FOR ANY PURPOSE, NOTIFY LAB WITHIN 5 DAYS.        LOWEST DETECTABLE LIMITS FOR URINE DRUG SCREEN Drug Class       Cutoff (ng/mL) Amphetamine      1000 Barbiturate      200 Benzodiazepine   200 Tricyclics       300 Opiates          300 Cocaine          300 THC              50   Basic metabolic panel     Status: Abnormal   Collection Time: 02/28/16  8:21 AM  Result Value Ref Range   Sodium  140 135 - 145 mmol/L   Potassium 4.1 3.5 - 5.1 mmol/L   Chloride 111 101 - 111 mmol/L   CO2 22 22 - 32 mmol/L   Glucose, Bld 105 (H) 65 - 99 mg/dL   BUN 17 6 - 20 mg/dL   Creatinine, Ser 1.48 (H) 0.61 - 1.24 mg/dL   Calcium 9.5 8.9 - 65.4 mg/dL   GFR calc non Af Amer 35 (L) >60 mL/min   GFR calc Af Amer 41 (L) >60 mL/min    Comment: (NOTE) The eGFR has been calculated using the CKD EPI equation. This calculation has not been validated in all clinical situations. eGFR's persistently <60 mL/min signify possible Chronic Kidney Disease.    Anion gap 7 5 - 15  CBC     Status: None   Collection Time: 02/28/16  8:21 AM  Result Value Ref Range   WBC 6.9  4.0 - 10.5 K/uL   RBC 4.74 4.22 - 5.81 MIL/uL   Hemoglobin 14.5 13.0 - 17.0 g/dL   HCT 42.1 39.0 - 52.0 %   MCV 88.8 78.0 - 100.0 fL   MCH 30.6 26.0 - 34.0 pg   MCHC 34.4 30.0 - 36.0 g/dL   RDW 13.8 11.5 - 15.5 %   Platelets 255 150 - 400 K/uL  I-stat troponin, ED     Status: None   Collection Time: 02/28/16  8:29 AM  Result Value Ref Range   Troponin i, poc 0.01 0.00 - 0.08 ng/mL   Comment 3            Comment: Due to the release kinetics of cTnI, a negative result within the first hours of the onset of symptoms does not rule out myocardial infarction with certainty. If myocardial infarction is still suspected, repeat the test at appropriate intervals.   I-stat troponin, ED     Status: None   Collection Time: 02/28/16 12:28 PM  Result Value Ref Range   Troponin i, poc 0.01 0.00 - 0.08 ng/mL   Comment 3            Comment: Due to the release kinetics of cTnI, a negative result within the first hours of the onset of symptoms does not rule out myocardial infarction with certainty. If myocardial infarction is still suspected, repeat the test at appropriate intervals.   Troponin I-serum (0, 3, 6 hours)     Status: None   Collection Time: 02/28/16  2:58 PM  Result Value Ref Range   Troponin I <0.03 <0.03 ng/mL  Hemoglobin  A1c     Status: Abnormal   Collection Time: 02/28/16  2:58 PM  Result Value Ref Range   Hgb A1c MFr Bld 5.8 (H) 4.8 - 5.6 %    Comment: (NOTE)         Pre-diabetes: 5.7 - 6.4         Diabetes: >6.4         Glycemic control for adults with diabetes: <7.0    Mean Plasma Glucose 120 mg/dL    Comment: (NOTE) Performed At: [facility] [place], [place] [id number] Lindon Romp MD UT:[id number]   Glucose, capillary     Status: Abnormal   Collection Time: 02/28/16  4:16 PM  Result Value Ref Range   Glucose-Capillary 118 (H) 65 - 99 mg/dL  Troponin I-serum (0, 3, 6 hours)     Status: None   Collection Time: 02/28/16  6:34 PM  Result Value Ref Range   Troponin I <0.03 <0.03 ng/mL  Troponin I     Status: None   Collection Time: 02/28/16  9:33 PM  Result Value Ref Range   Troponin I <0.03 <0.03 ng/mL  Lipid panel     Status: Abnormal   Collection Time: 02/29/16  2:59 AM  Result Value Ref Range   Cholesterol 211 (H) 0 - 200 mg/dL   Triglycerides 162 (H) <150 mg/dL   HDL 33 (L) >40 mg/dL   Total CHOL/HDL Ratio 6.4 RATIO   VLDL 32 0 - 40 mg/dL   LDL Cholesterol 146 (H) 0 - 99 mg/dL    Comment:        Total Cholesterol/HDL:CHD Risk Coronary Heart Disease Risk Table                     Men   Women  1/2 Average Risk   3.4  3.3  Average Risk       5.0   4.4  2 X Average Risk   9.6   7.1  3 X Average Risk  23.4   11.0        Use the calculated Patient Ratio above and the CHD Risk Table to determine the patient's CHD Risk.        ATP III CLASSIFICATION (LDL):  <100     mg/dL   Optimal  100-129  mg/dL   Near or Above                    Optimal  130-159  mg/dL   Borderline  160-189  mg/dL   High  >190     mg/dL   Very High      Lipid Panel     Component Value Date/Time   CHOL 211 (H) 02/29/2016 0259   TRIG 162 (H) 02/29/2016 0259   HDL 33 (L) 02/29/2016 0259   CHOLHDL 6.4 02/29/2016 0259   VLDL 32 02/29/2016 0259   LDLCALC 146 (H) 02/29/2016  0259     Lab Results  Component Value Date   HGBA1C 5.8 (H) 02/28/2016        HPI :   Kevin Todd is a 39 y.o. male with medical history significant for cocaine abuse, HTN,  presenting with left parasternal chest pain since 9  Am, without radiation, 6/10. No radiation to the jaw or left arm.Pain notworsened with deep inspiration, movement or exertion. Till presentation to the ED, he had 1 episode without recurrence.  Patient took ASA 324 mg on arrival, and then given NTG x1 with some relief.  Denies any dizziness or falls. No syncope or presyncope.Smokes 1/2 ppd cigarettes. Denies cocaine or meth. Drinks socially. . UDS positive for marijuana   HOSPITAL COURSE:    Chest pain syndrome  cardiac versus musculoskeletal  HEART score 3-4. Troponin 0.03, EKG without evidence of ACS. CP relieved by nitroglycerin x1,  Received aspirin x1. CXR unrevealing.  He is CP free at this time  Admitted under observation-telemetry showed normal sinus rhythm, cardiac enzymes negative, Chest pain likely the setting of hypertensive urgency, blood pressure in the 170s on arrival Received Norvasc, cardiac enzymes negative hemoglobin A1c 5.8, triglycerides 162, LDL 146-started patient on a statin Nicotine cessation counseling done 2-D echo results as above,patient given results and notified that he would need to follow up with PCP, very closely  AKI versus Acute on Chronic kidney disease stage 3  EGFR 41   Current Cr 2.21, unable to obtain a baseline Cr. Last lab value on 01/2013 1.39 , baseline unknown, repeat cr after IVF 1.96, ADVISED TO REFRAIN FROM NSAIDS    Tobacco abuse  -  Counseled cessation     Discharge Exam: *  Blood pressure 129/74, pulse 65, temperature 97.5 F (36.4 C), temperature source Oral, resp. rate 20, height '5\' 8"'$  (1.727 m), weight 134.7 kg (297 lb), SpO2 100 %.  Cardiovascular: Regular rate and rhythm, soft 1/6 systolic murmurs / rubs / gallops. Trace lower bilateral   extremity edema. 2+ pedal pulses. No carotid bruits.  Abdomen: morbidly obese no tenderness, no masses palpated. No hepatosplenomegaly. Bowel sounds positive.  Musculoskeletal: no clubbing / cyanosis. No joint deformity upper and lower extremities. Good ROM, no contractures. Normal muscle tone.  Skin: no rashes, lesions, ulcers.  Neurologic: CN 2-12 grossly intact. Sensation intact, DTR normal. Strength 5/5 in all 4.  Psychiatric: Normal judgment and insight.  Alert and oriented x 3. Normal mood    Follow-up Information    [facility]. Schedule an appointment as soon as possible for a visit in 3 day(s).   Why:  To establish primary care Contact information: [address] [place] [address] [place].   Specialty:  Emergency Medicine Why:  If symptoms worsen Contact information: [address] [id number] [place] [phone]          Signed: Reyne Dumas 02/29/2016, 8:59 AM        Time spent >45 mins

## 2016-02-29 NOTE — Progress Notes (Signed)
02/28/2016 1550 Received pt to 2W14 from ED.  Pt is A&O, no c/o voiced.  Tele monitor applied and CCMD notified.  Oriented to room, call light and bed.  Call bell in reach, family at bedside. [place]

## 2016-02-29 NOTE — Care Management Note (Signed)
Case Management Note Marvetta Gibbons RN, BSN Unit 2W-Case Manager [phone]  Patient Details  Name: Kevin Todd MRN: [id number] Date of Birth: [birth year]  Subjective/Objective:  Pt admitted with chest pain                  Action/Plan: PTA pt lived at home- independent- referral received for PCP needs- spoke with pt at bedside- confirmed that pt has [facility] insurance- discussed that pt can use insurance card to get list of in-network providers- also provided pt [employer] # with instructions for use to get a list of providers that are taking new patients.   Expected Discharge Date:  02/29/16               Expected Discharge Plan:  Home/Self Care  In-House Referral:     Discharge planning Services  CM Consult  Post Acute Care Choice:    Choice offered to:     DME Arranged:    DME Agency:     HH Arranged:    HH Agency:     Status of Service:  Completed, signed off  If discussed at [employer] of Stay Meetings, dates discussed:    Additional Comments:  Dawayne Patricia, RN 02/29/2016, 10:17 AM

## 2016-02-29 NOTE — Progress Notes (Signed)
Discussed with the patient and all questioned fully answered. He will call me if any problems arise.  IV removed. Telemetry removed. Pt educated on new medications and followup appointments. Pt verbalized understanding of how to schedule appt with PCP.   Fritz Pickerel, RN

## 2016-02-29 NOTE — Discharge Instructions (Signed)
Avoid NSAIDS-advil, aleve , ibuprofen, you can only take tylenol for pain if needed

## 2016-02-29 NOTE — Progress Notes (Signed)
  Echocardiogram 2D Echocardiogram has been performed.  Kevin Todd M 02/29/2016, 11:35 AM

## 2019-09-13 DIAGNOSIS — R748 Abnormal levels of other serum enzymes: Secondary | ICD-10-CM

## 2019-09-13 DIAGNOSIS — E559 Vitamin D deficiency, unspecified: Secondary | ICD-10-CM

## 2019-09-13 HISTORY — DX: Abnormal levels of other serum enzymes: R74.8

## 2019-09-13 HISTORY — DX: Vitamin D deficiency, unspecified: E55.9

## 2019-09-15 ENCOUNTER — Encounter (HOSPITAL_COMMUNITY): Payer: Self-pay

## 2019-09-15 ENCOUNTER — Other Ambulatory Visit: Payer: Self-pay

## 2019-09-15 ENCOUNTER — Emergency Department (HOSPITAL_COMMUNITY)
Admission: EM | Admit: 2019-09-15 | Discharge: 2019-09-16 | Disposition: A | Discharge status: Home / Self Care | Payer: 59 | Attending: Emergency Medicine | Admitting: Emergency Medicine

## 2019-09-15 DIAGNOSIS — R1032 Left lower quadrant pain: Secondary | ICD-10-CM | POA: Insufficient documentation

## 2019-09-15 DIAGNOSIS — R319 Hematuria, unspecified: Secondary | ICD-10-CM | POA: Insufficient documentation

## 2019-09-15 DIAGNOSIS — Z5321 Procedure and treatment not carried out due to patient leaving prior to being seen by health care provider: Secondary | ICD-10-CM | POA: Insufficient documentation

## 2019-09-15 LAB — BASIC METABOLIC PANEL
Anion gap: 8 (ref 5–15)
BUN: 25 mg/dL — ABNORMAL HIGH (ref 6–20)
CO2: 21 mmol/L — ABNORMAL LOW (ref 22–32)
Calcium: 9.1 mg/dL (ref 8.9–10.3)
Chloride: 110 mmol/L (ref 98–111)
Creatinine, Ser: 3.64 mg/dL — ABNORMAL HIGH (ref 0.61–1.24)
GFR calc Af Amer: 22 mL/min — ABNORMAL LOW (ref >60)
GFR calc non Af Amer: 19 mL/min — ABNORMAL LOW (ref >60)
Glucose, Bld: 112 mg/dL — ABNORMAL HIGH (ref 70–99)
Potassium: 4.5 mmol/L (ref 3.5–5.1)
Sodium: 139 mmol/L (ref 135–145)

## 2019-09-15 LAB — CBC
HCT: 37.8 % — ABNORMAL LOW (ref 39.0–52.0)
Hemoglobin: 12.5 g/dL — ABNORMAL LOW (ref 13.0–17.0)
MCH: 30.5 pg (ref 26.0–34.0)
MCHC: 33.1 g/dL (ref 30.0–36.0)
MCV: 92.2 fL (ref 80.0–100.0)
Platelets: 311 10*3/uL (ref 150–400)
RBC: 4.1 MIL/uL — ABNORMAL LOW (ref 4.22–5.81)
RDW: 13.8 % (ref 11.5–15.5)
WBC: 12.9 10*3/uL — ABNORMAL HIGH (ref 4.0–10.5)
nRBC: 0 % (ref 0.0–0.2)

## 2019-09-15 LAB — URINALYSIS, ROUTINE W REFLEX MICROSCOPIC
Bilirubin Urine: NEGATIVE
Glucose, UA: NEGATIVE mg/dL
Ketones, ur: NEGATIVE mg/dL
Nitrite: NEGATIVE
Protein, ur: 100 mg/dL — AB
RBC / HPF: >50 RBC/hpf — ABNORMAL HIGH (ref 0–5)
Specific Gravity, Urine: 1.01 (ref 1.005–1.030)
WBC, UA: >50 WBC/hpf — ABNORMAL HIGH (ref 0–5)
pH: 6 (ref 5.0–8.0)

## 2019-09-15 NOTE — ED Triage Notes (Signed)
Pt arrives to ED w/ c/o hematuria and L flank pain that started today. Pt reports 8/10. Denies hx of kidney stones.

## 2019-09-16 LAB — URINE CULTURE: Culture: <10000 — AB

## 2019-09-16 NOTE — ED Notes (Signed)
Pt called for room x2 no response.

## 2019-09-17 ENCOUNTER — Other Ambulatory Visit: Payer: Self-pay

## 2019-09-17 ENCOUNTER — Encounter (HOSPITAL_COMMUNITY): Payer: Self-pay

## 2019-09-17 ENCOUNTER — Emergency Department (HOSPITAL_COMMUNITY): Payer: Self-pay

## 2019-09-17 ENCOUNTER — Inpatient Hospital Stay (HOSPITAL_COMMUNITY)
Admission: EM | Admit: 2019-09-17 | Discharge: 2019-09-19 | DRG: 683 | Disposition: A | Discharge status: Home / Self Care | Payer: Self-pay | Attending: Internal Medicine | Admitting: Internal Medicine

## 2019-09-17 ENCOUNTER — Inpatient Hospital Stay (HOSPITAL_COMMUNITY): Payer: Self-pay

## 2019-09-17 DIAGNOSIS — F17210 Nicotine dependence, cigarettes, uncomplicated: Secondary | ICD-10-CM | POA: Diagnosis present

## 2019-09-17 DIAGNOSIS — N179 Acute kidney failure, unspecified: Principal | ICD-10-CM

## 2019-09-17 DIAGNOSIS — N10 Acute tubulo-interstitial nephritis: Secondary | ICD-10-CM | POA: Diagnosis present

## 2019-09-17 DIAGNOSIS — K921 Melena: Secondary | ICD-10-CM | POA: Diagnosis present

## 2019-09-17 DIAGNOSIS — N390 Urinary tract infection, site not specified: Secondary | ICD-10-CM | POA: Diagnosis present

## 2019-09-17 DIAGNOSIS — R937 Abnormal findings on diagnostic imaging of other parts of musculoskeletal system: Secondary | ICD-10-CM | POA: Diagnosis present

## 2019-09-17 DIAGNOSIS — Z9114 Patient's other noncompliance with medication regimen: Secondary | ICD-10-CM

## 2019-09-17 DIAGNOSIS — N183 Chronic kidney disease, stage 3 unspecified: Secondary | ICD-10-CM | POA: Diagnosis present

## 2019-09-17 DIAGNOSIS — I160 Hypertensive urgency: Secondary | ICD-10-CM | POA: Diagnosis present

## 2019-09-17 DIAGNOSIS — Z800 Family history of malignant neoplasm of digestive organs: Secondary | ICD-10-CM

## 2019-09-17 DIAGNOSIS — E872 Acidosis: Secondary | ICD-10-CM | POA: Diagnosis present

## 2019-09-17 DIAGNOSIS — R310 Gross hematuria: Secondary | ICD-10-CM | POA: Diagnosis present

## 2019-09-17 DIAGNOSIS — Z20822 Contact with and (suspected) exposure to covid-19: Secondary | ICD-10-CM | POA: Diagnosis present

## 2019-09-17 DIAGNOSIS — K625 Hemorrhage of anus and rectum: Secondary | ICD-10-CM

## 2019-09-17 DIAGNOSIS — E785 Hyperlipidemia, unspecified: Secondary | ICD-10-CM | POA: Diagnosis present

## 2019-09-17 DIAGNOSIS — Q613 Polycystic kidney, unspecified: Secondary | ICD-10-CM

## 2019-09-17 DIAGNOSIS — R1032 Left lower quadrant pain: Secondary | ICD-10-CM

## 2019-09-17 DIAGNOSIS — Z8249 Family history of ischemic heart disease and other diseases of the circulatory system: Secondary | ICD-10-CM

## 2019-09-17 DIAGNOSIS — I10 Essential (primary) hypertension: Secondary | ICD-10-CM | POA: Insufficient documentation

## 2019-09-17 DIAGNOSIS — I151 Hypertension secondary to other renal disorders: Secondary | ICD-10-CM | POA: Diagnosis present

## 2019-09-17 DIAGNOSIS — Z720 Tobacco use: Secondary | ICD-10-CM | POA: Diagnosis present

## 2019-09-17 DIAGNOSIS — R8281 Pyuria: Secondary | ICD-10-CM | POA: Diagnosis present

## 2019-09-17 DIAGNOSIS — Z6841 Body Mass Index (BMI) 40.0 and over, adult: Secondary | ICD-10-CM

## 2019-09-17 DIAGNOSIS — K5730 Diverticulosis of large intestine without perforation or abscess without bleeding: Secondary | ICD-10-CM | POA: Diagnosis present

## 2019-09-17 LAB — COMPREHENSIVE METABOLIC PANEL
ALT: 20 U/L (ref 0–44)
AST: 17 U/L (ref 15–41)
Albumin: 3.7 g/dL (ref 3.5–5.0)
Alkaline Phosphatase: 65 U/L (ref 38–126)
Anion gap: 9 (ref 5–15)
BUN: 36 mg/dL — ABNORMAL HIGH (ref 6–20)
CO2: 24 mmol/L (ref 22–32)
Calcium: 9 mg/dL (ref 8.9–10.3)
Chloride: 105 mmol/L (ref 98–111)
Creatinine, Ser: 4.51 mg/dL — ABNORMAL HIGH (ref 0.61–1.24)
GFR calc Af Amer: 17 mL/min — ABNORMAL LOW (ref >60)
GFR calc non Af Amer: 15 mL/min — ABNORMAL LOW (ref >60)
Glucose, Bld: 104 mg/dL — ABNORMAL HIGH (ref 70–99)
Potassium: 4.6 mmol/L (ref 3.5–5.1)
Sodium: 138 mmol/L (ref 135–145)
Total Bilirubin: 0.8 mg/dL (ref 0.3–1.2)
Total Protein: 7.5 g/dL (ref 6.5–8.1)

## 2019-09-17 LAB — RESPIRATORY PANEL BY RT PCR (FLU A&B, COVID)
Influenza A by PCR: NEGATIVE
Influenza B by PCR: NEGATIVE
SARS Coronavirus 2 by RT PCR: NEGATIVE

## 2019-09-17 LAB — SODIUM, URINE, RANDOM: Sodium, Ur: 46 mmol/L

## 2019-09-17 LAB — URINALYSIS, ROUTINE W REFLEX MICROSCOPIC
Bacteria, UA: NONE SEEN
RBC / HPF: >50 RBC/hpf — ABNORMAL HIGH (ref 0–5)
WBC, UA: >50 WBC/hpf — ABNORMAL HIGH (ref 0–5)

## 2019-09-17 LAB — CBC WITH DIFFERENTIAL/PLATELET
Abs Immature Granulocytes: 0.03 10*3/uL (ref 0.00–0.07)
Basophils Absolute: 0 10*3/uL (ref 0.0–0.1)
Basophils Relative: 0 %
Eosinophils Absolute: 0.1 10*3/uL (ref 0.0–0.5)
Eosinophils Relative: 1 %
HCT: 36.4 % — ABNORMAL LOW (ref 39.0–52.0)
Hemoglobin: 12 g/dL — ABNORMAL LOW (ref 13.0–17.0)
Immature Granulocytes: 0 %
Lymphocytes Relative: 11 %
Lymphs Abs: 1.4 10*3/uL (ref 0.7–4.0)
MCH: 30.4 pg (ref 26.0–34.0)
MCHC: 33 g/dL (ref 30.0–36.0)
MCV: 92.2 fL (ref 80.0–100.0)
Monocytes Absolute: 1 10*3/uL (ref 0.1–1.0)
Monocytes Relative: 8 %
Neutro Abs: 10 10*3/uL — ABNORMAL HIGH (ref 1.7–7.7)
Neutrophils Relative %: 80 %
Platelets: 289 10*3/uL (ref 150–400)
RBC: 3.95 MIL/uL — ABNORMAL LOW (ref 4.22–5.81)
RDW: 13.8 % (ref 11.5–15.5)
WBC: 12.6 10*3/uL — ABNORMAL HIGH (ref 4.0–10.5)
nRBC: 0 % (ref 0.0–0.2)

## 2019-09-17 LAB — TYPE AND SCREEN
ABO/RH(D): A POS
Antibody Screen: NEGATIVE

## 2019-09-17 LAB — CBC
HCT: 40.5 % (ref 39.0–52.0)
Hemoglobin: 13.5 g/dL (ref 13.0–17.0)
MCH: 31.1 pg (ref 26.0–34.0)
MCHC: 33.3 g/dL (ref 30.0–36.0)
MCV: 93.3 fL (ref 80.0–100.0)
Platelets: 313 10*3/uL (ref 150–400)
RBC: 4.34 MIL/uL (ref 4.22–5.81)
RDW: 13.7 % (ref 11.5–15.5)
WBC: 15.9 10*3/uL — ABNORMAL HIGH (ref 4.0–10.5)
nRBC: 0 % (ref 0.0–0.2)

## 2019-09-17 LAB — POC OCCULT BLOOD, ED: Fecal Occult Bld: NEGATIVE

## 2019-09-17 LAB — CREATININE, URINE, RANDOM: Creatinine, Urine: 100.3 mg/dL

## 2019-09-17 LAB — PROTIME-INR
INR: 1.1 (ref 0.8–1.2)
Prothrombin Time: 13.6 seconds (ref 11.4–15.2)

## 2019-09-17 LAB — PHOSPHORUS: Phosphorus: 3.7 mg/dL (ref 2.5–4.6)

## 2019-09-17 MED ORDER — SODIUM CHLORIDE 0.9 % IV SOLN
1.0000 g | INTRAVENOUS | Status: DC
  Administered 2019-09-17 – 2019-09-19 (×3): 1 g via INTRAVENOUS
  Filled 2019-09-17: qty 1
  Filled 2019-09-17: qty 10
  Filled 2019-09-17: qty 1
  Filled 2019-09-17: qty 10

## 2019-09-17 MED ORDER — AMLODIPINE BESYLATE 5 MG PO TABS
5.0000 mg | ORAL_TABLET | Freq: Every day | ORAL | Status: DC
  Administered 2019-09-17: 5 mg via ORAL
  Filled 2019-09-17: qty 1

## 2019-09-17 MED ORDER — ACETAMINOPHEN 325 MG PO TABS
650.0000 mg | ORAL_TABLET | Freq: Four times a day (QID) | ORAL | Status: DC | PRN
  Administered 2019-09-17: 650 mg via ORAL
  Filled 2019-09-17: qty 2

## 2019-09-17 MED ORDER — SODIUM CHLORIDE (PF) 0.9 % IJ SOLN
INTRAMUSCULAR | Status: AC
  Filled 2019-09-17: qty 50

## 2019-09-17 MED ORDER — SODIUM CHLORIDE 0.9 % IV BOLUS
1000.0000 mL | Freq: Once | INTRAVENOUS | Status: AC
  Administered 2019-09-17: 1000 mL via INTRAVENOUS

## 2019-09-17 MED ORDER — SORBITOL 70 % SOLN
30.0000 mL | Freq: Every day | Status: DC | PRN
  Filled 2019-09-17: qty 30

## 2019-09-17 MED ORDER — ONDANSETRON HCL 4 MG/2ML IJ SOLN
4.0000 mg | Freq: Once | INTRAMUSCULAR | Status: AC
  Administered 2019-09-17: 4 mg via INTRAVENOUS
  Filled 2019-09-17: qty 2

## 2019-09-17 MED ORDER — MORPHINE SULFATE (PF) 4 MG/ML IV SOLN
4.0000 mg | Freq: Once | INTRAVENOUS | Status: AC
  Administered 2019-09-17: 4 mg via INTRAVENOUS
  Filled 2019-09-17: qty 1

## 2019-09-17 MED ORDER — LABETALOL HCL 5 MG/ML IV SOLN: 10.0000 mg | INTRAVENOUS | Status: DC | PRN

## 2019-09-17 MED ORDER — ONDANSETRON HCL 4 MG/2ML IJ SOLN: 4.0000 mg | Freq: Four times a day (QID) | INTRAMUSCULAR | Status: DC | PRN

## 2019-09-17 MED ORDER — ATORVASTATIN CALCIUM 20 MG PO TABS
20.0000 mg | ORAL_TABLET | Freq: Every day | ORAL | Status: DC
  Administered 2019-09-18 – 2019-09-19 (×2): 20 mg via ORAL
  Filled 2019-09-17 (×2): qty 1

## 2019-09-17 MED ORDER — ALBUTEROL SULFATE (2.5 MG/3ML) 0.083% IN NEBU: 2.5000 mg | INHALATION_SOLUTION | RESPIRATORY_TRACT | Status: DC | PRN

## 2019-09-17 MED ORDER — HYDROMORPHONE HCL 1 MG/ML IJ SOLN: 0.5000 mg | INTRAMUSCULAR | Status: DC | PRN

## 2019-09-17 MED ORDER — SODIUM CHLORIDE 0.9% FLUSH
3.0000 mL | Freq: Two times a day (BID) | INTRAVENOUS | Status: DC
  Administered 2019-09-18 – 2019-09-19 (×2): 3 mL via INTRAVENOUS

## 2019-09-17 MED ORDER — HYDRALAZINE HCL 25 MG PO TABS
25.0000 mg | ORAL_TABLET | Freq: Three times a day (TID) | ORAL | Status: DC
  Administered 2019-09-17 – 2019-09-19 (×6): 25 mg via ORAL
  Filled 2019-09-17 (×6): qty 1

## 2019-09-17 MED ORDER — IOHEXOL 9 MG/ML PO SOLN
ORAL | Status: AC
  Filled 2019-09-17: qty 1000

## 2019-09-17 MED ORDER — ACETAMINOPHEN 650 MG RE SUPP: 650.0000 mg | Freq: Four times a day (QID) | RECTAL | Status: DC | PRN

## 2019-09-17 MED ORDER — OXYCODONE HCL 5 MG PO TABS
5.0000 mg | ORAL_TABLET | ORAL | Status: DC | PRN
  Administered 2019-09-18 – 2019-09-19 (×2): 5 mg via ORAL
  Filled 2019-09-17 (×4): qty 1

## 2019-09-17 MED ORDER — ONDANSETRON HCL 4 MG PO TABS: 4.0000 mg | ORAL_TABLET | Freq: Four times a day (QID) | ORAL | Status: DC | PRN

## 2019-09-17 MED ORDER — SENNOSIDES-DOCUSATE SODIUM 8.6-50 MG PO TABS: 1.0000 | ORAL_TABLET | Freq: Every evening | ORAL | Status: DC | PRN

## 2019-09-17 NOTE — ED Triage Notes (Addendum)
Pt reports abdominal pain X2 days and noticed blood in his stool this morning.  Pt also noticed blood clots in his urine 2 days ago that has now subsided.   8/10 left/mid abd area    Pt reports going to [facility] last night but left due to the wait.    Denies N/V   Pt reports hx of hypertension and has not taken BP medication in months.

## 2019-09-17 NOTE — H&P (Signed)
History and Physical    Kevin Todd SMO:[id number] DOB: 1976/02/01 DOA: 09/17/2019  PCP: Patient, No Pcp Per  Patient coming from: Home  I have personally briefly reviewed patient's old medical records in [place]  Chief Complaint: Blood in urine/bloody stools/left-sided abdominal pain  HPI: Kevin Todd is a 44 y.o. male with medical history significant of uncontrolled hypertension, medication noncompliance due to financial issues per patient, morbid obesity, ongoing tobacco abuse, hyperlipidemia presenting to the ED with a 2 to 3-day history of intermittent gross hematuria, left sided upper and mid lower abdominal pain.  Patient stated the night prior to admission had some blood in his stool with clots.  Patient also with complaints of nausea, constipation.  Patient denies any fevers, no chills, no chest pain, no shortness of breath, no cough, no diarrhea, no emesis, no syncopal episode, no weakness.  Patient does endorse some occasional lightheadedness.  No visual changes.  No asymmetric weakness or numbness.  No hematemesis, no melena.  Patient denies any dysuria.  Patient stated presented to [facility] 1 to 2 days prior to admission however due to long wait he left the emergency room and went home.  Patient presenting back due to ongoing gross hematuria.  ED Course: Patient seen in the ED, compressive metabolic profile with a BUN of 36, creatinine of 4.51 otherwise was within normal limits.  CBC with a leukocytosis with a white count of 15.9 otherwise was within normal limits.  INR noted to be 1.1.  Urinalysis pending.  FOBT negative.  CT abdomen and pelvis with mild distal colonic diverticulosis without diverticulitis, multicystic replacement of both kidneys suggestive of polycystic kidney disease however no prior exams are available for comparison.  Left renal enlargement compared to right with prominence of left renal collecting system, perinephric and periureteric edema.   Mild right perinephric edema to a lesser extent.  Findings are suspicious for UTI.  Possibility of recently passed renal calculus also considered.  No evidence of obstructive uropathy.  Scattered areas of high density within the left kidney may represent normal renal parenchymal interspersed with renal cysts versus hyperdense cyst.  Possibility of solid lesion difficult to exclude on noncontrast exam.  Consider further evaluation with contrast-enhanced exam and renal function persists, MRI would provide greater soft tissue evaluation.  This could be performed on an elective basis based on clinical scenario.  Sclerotic lesions of the right ischium and right inferior pubic ramus, indeterminate.  Hepatic steatosis.  Within the ED to have a hypertensive urgency with systolic blood pressure of 199/118.  ED physician consulted with nephrology.  Hospitalist were called to admit the patient for further evaluation and management.  Review of Systems: As per HPI otherwise all other systems reviewed and are negative.  Past Medical History:  Diagnosis Date  . Hypertension   . Pneumothorax on right     History reviewed. No pertinent surgical history.  Social History  reports that he has been smoking. He has never used smokeless tobacco. He reports current alcohol use. He reports that he does not use drugs.  No Known Allergies  Family History  Problem Relation Age of Onset  . Hypertension Mother    Mother alive age 22 with a history of hypertension.  Father deceased age 7 and per mother from running on the streets.  Prior to Admission medications   Medication Sig Start Date End Date Taking? Authorizing Provider  Bisacodyl (LAXATIVE PO) Take 1 tablet by mouth daily as needed (constipation).  Yes [provider]  amLODipine (NORVASC) 10 MG tablet Take 1 tablet (10 mg total) by mouth daily. Patient not taking: Reported on 09/17/2019 02/29/16   Reyne Dumas, MD  atorvastatin (LIPITOR) 20 MG tablet  Take 1 tablet (20 mg total) by mouth daily. Patient not taking: Reported on 09/17/2019 02/29/16   Reyne Dumas, MD  meclizine (ANTIVERT) 50 MG tablet Take 1 tablet (50 mg total) by mouth 3 (three) times daily as needed. Patient not taking: Reported on 02/28/2016 01/21/13   Ward, Delice Bison, DO  nicotine (NICODERM CQ - DOSED IN MG/24 HOURS) 21 mg/24hr patch Place 1 patch (21 mg total) onto the skin daily. Patient not taking: Reported on 09/17/2019 02/29/16   Reyne Dumas, MD  ondansetron (ZOFRAN) 4 MG tablet Take 1 tablet (4 mg total) by mouth every 6 (six) hours. Patient not taking: Reported on 02/28/2016 01/21/13   Ward, Delice Bison, DO    Physical Exam: Vitals:   09/17/19 1500 09/17/19 1715 09/17/19 2000 09/17/19 2116  BP: (!) 185/87 (!) 149/76 (!) 159/71 (!) 199/118  Pulse: 94 91 83   Resp: 19 16    Temp:      TempSrc:      SpO2: 94% 94%      Constitutional: NAD, calm, comfortable.  Obese.   Vitals:   09/17/19 1500 09/17/19 1715 09/17/19 2000 09/17/19 2116  BP: (!) 185/87 (!) 149/76 (!) 159/71 (!) 199/118  Pulse: 94 91 83   Resp: 19 16    Temp:      TempSrc:      SpO2: 94% 94%     Eyes: PERRL, lids and conjunctivae normal ENMT: Mucous membranes are moist. Posterior pharynx clear of any exudate or lesions.Normal dentition.  Neck: normal, supple, no masses, no thyromegaly Respiratory: clear to auscultation bilaterally anterior lung fields, no wheezing, no crackles. Normal respiratory effort. No accessory muscle use.  Cardiovascular: Regular rate and rhythm, no murmurs / rubs / gallops. No extremity edema. 2+ pedal pulses. No carotid bruits.  Abdomen: Obese, soft, tender to palpation left upper and left mid abdominal regions, positive bowel sounds, no rebound, no guarding.  No hepatosplenomegaly noted.  Musculoskeletal: no clubbing / cyanosis. No joint deformity upper and lower extremities. Good ROM, no contractures. Normal muscle tone.  Skin: no rashes, lesions, ulcers. No  induration Neurologic: CN 2-12 grossly intact. Sensation intact, DTR normal. Strength 5/5 in all 4.  Psychiatric: Normal judgment and insight. Alert and oriented x 3. Normal mood.   Labs on Admission: I have personally reviewed following labs and imaging studies  CBC: Recent Labs  Lab 09/15/19 2149 09/17/19 1355  WBC 12.9* 15.9*  HGB 12.5* 13.5  HCT 37.8* 40.5  MCV 92.2 93.3  PLT 311 280    Basic Metabolic Panel: Recent Labs  Lab 09/15/19 2149 09/17/19 1355  NA 139 138  K 4.5 4.6  CL 110 105  CO2 21* 24  GLUCOSE 112* 104*  BUN 25* 36*  CREATININE 3.64* 4.51*  CALCIUM 9.1 9.0    GFR: Estimated Creatinine Clearance: 29.7 mL/min (A) (by C-G formula based on SCr of 4.51 mg/dL (H)).  Liver Function Tests: Recent Labs  Lab 09/17/19 1355  AST 17  ALT 20  ALKPHOS 65  BILITOT 0.8  PROT 7.5  ALBUMIN 3.7    Urine analysis:    Component Value Date/Time   COLORURINE RED (A) 09/17/2019 1351   APPEARANCEUR TURBID (A) 09/17/2019 1351   LABSPEC  09/17/2019 1351    TEST NOT  REPORTED DUE TO COLOR INTERFERENCE OF URINE PIGMENT   PHURINE  09/17/2019 1351    TEST NOT REPORTED DUE TO COLOR INTERFERENCE OF URINE PIGMENT   GLUCOSEU (A) 09/17/2019 1351    TEST NOT REPORTED DUE TO COLOR INTERFERENCE OF URINE PIGMENT   HGBUR (A) 09/17/2019 1351    TEST NOT REPORTED DUE TO COLOR INTERFERENCE OF URINE PIGMENT   BILIRUBINUR (A) 09/17/2019 1351    TEST NOT REPORTED DUE TO COLOR INTERFERENCE OF URINE PIGMENT   KETONESUR (A) 09/17/2019 1351    TEST NOT REPORTED DUE TO COLOR INTERFERENCE OF URINE PIGMENT   PROTEINUR (A) 09/17/2019 1351    TEST NOT REPORTED DUE TO COLOR INTERFERENCE OF URINE PIGMENT   NITRITE (A) 09/17/2019 1351    TEST NOT REPORTED DUE TO COLOR INTERFERENCE OF URINE PIGMENT   LEUKOCYTESUR (A) 09/17/2019 1351    TEST NOT REPORTED DUE TO COLOR INTERFERENCE OF URINE PIGMENT    Radiological Exams on Admission: CT Abdomen Pelvis Wo Contrast  Result Date:  09/17/2019 CLINICAL DATA:  Abdominal pain for 2 days. Blood in stool this morning. Blood in urine 2 days ago that is subsided. Left mid and abdominal pain. Diverticulitis suspected. EXAM: CT ABDOMEN AND PELVIS WITHOUT CONTRAST TECHNIQUE: Multidetector CT imaging of the abdomen and pelvis was performed following the standard protocol without IV contrast. COMPARISON:  None. FINDINGS: Lower chest: Subsegmental atelectasis in the lingula and right lower lobe. No pleural fluid. No confluent airspace disease. Hepatobiliary: Decreased hepatic density consistent with steatosis. No evidence of focal lesion. Gallbladder physiologically distended, no calcified stone. No biliary dilatation. Pancreas: Minimal soft tissue stranding about the pancreatic tail. No pancreatic ductal dilatation. No discrete peripancreatic collection. Spleen: Normal in size without focal abnormality. Adrenals/Urinary Tract: Normal adrenal glands. Mild left renal enlargement (16 cm cranial caudal) and mild right renal atrophy (10.7 cm cranial caudal). There is multi cystic replacement of both kidneys, some of the cysts demonstrating peripheral calcifications. Question of intervening hyperdense cyst versus normal renal parenchyma, incompletely characterized on noncontrast exam. There is left greater than right perinephric edema. Mild left hydronephrosis without ureteral calculi. Mild stranding about the left ureter. Urinary bladder is physiologically distended. No definite bladder wall thickening. Stomach/Bowel: Stomach is unremarkable. No small bowel obstruction or inflammation, administered enteric contrast reaches the colon. Normal contrast filled appendix. No appendicitis. Mild diverticulosis of the distal descending and sigmoid colon without focal diverticulitis. No colonic wall thickening. Vascular/Lymphatic: Faint aortic atherosclerosis. No aortic aneurysm. There multiple prominent periportal and retroperitoneal lymph nodes are not enlarged by size  criteria. Reproductive: Prostate is unremarkable. Other: No ascites or free fluid. No free air. No evidence of intra-abdominal abscess. Musculoskeletal: There are scattered areas of bony sclerosis involving the pelvis, including the right pubic body and proximal inferior pubic ramus, series 2, image 86. Additional sclerotic right ischial lesion, series 2, image 89. Degenerative change involving the spine and both hips. IMPRESSION: 1. Mild distal colonic diverticulosis without diverticulitis. 2. Multi cystic replacement of both kidneys suggestive of polycystic kidney disease, however no prior exams are available for comparison. There is left renal enlargement compared to right, with prominence of the left renal collecting system, perinephric and periureteric edema. Mild right perinephric edema to a lesser extent. Findings are suspicious for urinary tract infection. Possibility of recently passed renal calculus is also considered. There is no evidence of obstructive uropathy. 3. Scattered areas of higher density within the left kidney may represent normal renal parenchyma interspersed with renal cysts versus hyperdense cysts.  Possibility of a solid lesion is difficult to exclude on noncontrast exam. Consider further evaluation with contrast-enhanced exam if renal function persists, MRI would provide greater soft tissue evaluation. This could be performed on an elective basis based on clinical scenario. 4. Sclerotic lesions of the right ischium and right inferior pubic ramus, indeterminate. 5. Hepatic steatosis. Electronically Signed   By: Keith Rake M.D.   On: 09/17/2019 17:25    EKG: Not done  Assessment/Plan Principal Problem:   ARF (acute renal failure) (HCC) Active Problems:   Hypertensive urgency   Morbid obesity (HCC)   Tobacco abuse   Gross hematuria   Acute pyelonephritis   Complicated UTI (urinary tract infection)   Polycystic kidney disease   1 acute renal failure versus acute renal  failure on chronic kidney disease stage III Patient presented with a 2 to 3-day history of intermittent gross hematuria, left-sided abdominal pain, dysuria, nausea.  Patient with gross hematuria noted at bedside.  Patient also hypertensive with systolic blood pressures in the 180s noted at bedside.  Patient with creatinine today of 4.51 was 3.6453 2021.  Last creatinine noted in system from 02/29/2016 was 1.96.  Questionable etiology.  Concern for possible nephritic syndrome.  Patient however, noted to have polycystic kidney disease noted on CT abdomen and pelvis which was done in the ED.  CT negative for obstructive uropathy.  Urinalysis and urine culture pending.  UA noted from 09/15/2019 was turbid, red, large hemoglobin, nitrite negative, small leukocytes, 100 protein, few bacteria, greater than 50 WBCs, greater than 50 RBCs.  Check a urine sodium, urine creatinine, renal ultrasound which is about to be done in the ED.  Check ANA.  Place a Foley catheter.  Nephrology consulted.  2.  Gross hematuria Questionable etiology.  Concern for a passed kidney stone versus ruptured cyst from polycystic kidney disease noted.  Place a Foley catheter.  Flush Foley.  Consult with urology in the morning for further evaluation and management.  3.  Probable complicated UTI/pyelonephritis Urinalysis pending.  Patient with complaints of dysuria and gross hematuria.  Urinalysis from 09/15/2019 was turbid, large hemoglobin, small leukocytes, greater than 50 WBCs.  Repeat UA and culture is pending.  Placed empirically on IV Rocephin.  Follow.  4.  Hypertensive urgency Placed on hydralazine 25 mg 3 times daily.  Norvasc 10 mg daily.  IV labetalol as needed for systolic blood pressure greater than 619 or diastolic blood pressure greater than 110.  5.  Tobacco abuse Tobacco cessation stressed to patient.  Patient states tried a nicotine patch in the past however unable to tolerate it.  6.  Morbid obesity  7.   Hyperlipidemia Check a fasting lipid panel.  Resume statin.  8.  Sclerotic lesions of the right ischium and right inferior pubic ramus May need bone survey.  DVT prophylaxis: SCDs/patient with gross hematuria Code Status:   Full Family Communication:  Updated patient and mother at bedside. Disposition Plan:   Patient is from:  Home  Anticipated DC to:  Home  Anticipated DC date:  To be determined  Anticipated DC barriers: Improvement in renal function, resolution of gross hematuria, clearance by nephrology and probably urology, improvement with hypertensive urgency.  Consults called: Nephrology: Dr. Moshe Cipro 09/17/2019 Admission status:  Admit to inpatient/telemetry  Severity of Illness: The appropriate patient status for this patient is INPATIENT. Inpatient status is judged to be reasonable and necessary in order to provide the required intensity of service to ensure the patient's safety. The patient's presenting  symptoms, physical exam findings, and initial radiographic and laboratory data in the context of their chronic comorbidities is felt to place them at high risk for further clinical deterioration. Furthermore, it is not anticipated that the patient will be medically stable for discharge from the hospital within 2 midnights of admission. The following factors support the patient status of inpatient.   "The patient's presenting symptoms include  uncontrolled hypertension/hypertensive urgency, acute renal failure, gross hematuria, complicated UTI.  "The worrisome physical exam findings includeleft-sided abdominal pain. "The initial radiographic and laboratory data are worrisome because ofworsening renal function with creatinine in the fours, concern for UTI, gross hematuria, polycystic kidney disease. "The chronic co-morbidities includepoorly controlled hypertension, hyperlipidemia, medication noncompliance   * I certify that at the  point of admission it is my clinical judgment that the patient will require inpatient hospital care spanning beyond 2 midnights from the point of admission due to high intensity of service, high risk for further deterioration and high frequency of surveillance required.*     Irine Seal MD [facility]  How to contact the [facility] Attending or Consulting provider [place] or covering provider during after hours [place], for this patient?   1. Check the care team in [facility] and look for a) attending/consulting [facility] provider listed and b) the [facility] team listed 2. Log into [email or web address] and use [place]'s universal password to access. If you do not have the password, please contact the hospital operator. 3. Locate the [facility] provider you are looking for under [facility] and page to a number that you can be directly reached. 4. If you still have difficulty reaching the provider, please page the [facility] (Director on Call) for the Hospitalists listed on amion for assistance.  09/17/2019, 9:21 PM

## 2019-09-17 NOTE — ED Provider Notes (Signed)
Care assumed from Dr. Melina Copa.  At time of transfer care, patient is awaiting results of CT scan and urinalysis before admission for AKI with significant hematuria and left flank pain.  Patient CT scans returned showing abnormalities including possible UTI or recently passed stone as well as abnormalities in the kidney.  Chart review also shows that patient does have significant kidney injury compared to prior.  Patient had blood work done several days ago when he left without being seen and his creatinine was up to 2.632 days ago.  Today, it is down to 4.51 and previous value show it being around 2.  Due to the abnormalities on the imaging as well as the worsening creatinine over the last 36 hours, I will call nephrology for recommendations but I will anticipate admission.  8:08 PM Joe spoke with nephrology who recommends a renal ultrasound, another liter of fluids, and admission to medicine service for further evaluation of the patient's worsening kidney function and the hematuria.  Urine was sent however it was extremely bloody and I am unsure if it will give good results.  Anticipate admission shortly.    Clinical Impression: 1. LLQ abdominal pain   2. Rectal bleeding   3. AKI (acute kidney injury) ([place])   4. Essential hypertension     Disposition: Admit  This note was prepared with assistance of Dragon voice recognition software. Occasional wrong-word or sound-a-like substitutions may have occurred due to the inherent limitations of voice recognition software.     Shavelle Runkel, Gwenyth Allegra, MD 09/18/19 [phone]

## 2019-09-17 NOTE — ED Notes (Signed)
Attempted to retake patient's BP during orthostatic VS, but patient became SOB and dizzy.

## 2019-09-17 NOTE — Consult Note (Signed)
[facility] Renal Consultation Note  Requesting MD: [name] Indication for Consultation: A on CRF-  Hematuria   HPI: *Kevin Todd is a 44 y.o. male with past medical history significant for hypertension at least since 2017 when was hospitalized for hypertensive urgency.  Unfortunately, his social situation has not allowed him to continue on medications or follow with a physician.  Patient also has CKD that was evident in 2017.  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  He came to the emergency department but left because the wait was too long.  This pain continued off and on and then he developed gross hematuria which brought him back to the hospital today.  On 5/3 labs were obtained showing a creatinine of 3.64 which was worse-creatinine around 2 in 2017.  Today, creatinine 4.5.   Abdominal CT is suggestive of polycystic kidney disease with possibly some evidence of a recently passed stone on the left.  Renal ultrasound also shows evidence of polycystic kidney disease but no obstructive uropathy.  Patient is hypertensive.  Other than left-sided flank pain he reports what feels like constipation.  He denies nausea, edema or fatigue.   He has not been taking NSAIDs, ACE inhibitor's or ARB's.  His mother is in the room with him.  They deny any family history of polycystic kidney disease.  Patient's grandmother does have CKD of unknown etiology.  Full urinal in the room with urine appearance similar to grape juice.    Creatinine, Ser  Date/Time Value Ref Range Status  09/17/2019 01:55 PM 4.51 (H) 0.61 - 1.24 mg/dL Final  09/15/2019 09:49 PM 3.64 (H) 0.61 - 1.24 mg/dL Final  02/29/2016 10:39 AM 1.96 (H) 0.61 - 1.24 mg/dL Final  02/28/2016 08:21 AM 2.21 (H) 0.61 - 1.24 mg/dL Final  01/21/2013 11:59 AM 1.39 (H) 0.50 - 1.35 mg/dL Final     PMHx:   Past Medical  History:  Diagnosis Date  . Hypertension   . Pneumothorax on right     History reviewed. No pertinent surgical history.  Family Hx:  Family History  Problem Relation Age of Onset  . Hypertension Mother     Social History:  reports that he has been smoking. He has never used smokeless tobacco. He reports current alcohol use. He reports that he does not use drugs.  Allergies: No Known Allergies  Medications: Prior to Admission medications   Medication Sig Start Date End Date Taking? Authorizing Provider  Bisacodyl (LAXATIVE PO) Take 1 tablet by mouth daily as needed (constipation).   Yes [provider]  amLODipine (NORVASC) 10 MG tablet Take 1 tablet (10 mg total) by mouth daily. Patient not taking: Reported on 09/17/2019 02/29/16   Reyne Dumas, MD  atorvastatin (LIPITOR) 20 MG tablet Take 1 tablet (20 mg total) by mouth daily. Patient not taking: Reported on 09/17/2019 02/29/16   Reyne Dumas, MD  meclizine (ANTIVERT) 50 MG tablet Take 1 tablet (50 mg total) by mouth 3 (three) times daily as needed. Patient not taking: Reported on 02/28/2016 01/21/13   Ward, Delice Bison, DO  nicotine (NICODERM CQ - DOSED IN MG/24 HOURS) 21 mg/24hr patch Place 1 patch (21 mg total) onto the skin daily. Patient not taking: Reported on 09/17/2019 02/29/16   Reyne Dumas, MD  ondansetron (ZOFRAN) 4 MG tablet Take 1 tablet (4 mg total) by mouth every 6 (six) hours. Patient  not taking: Reported on 02/28/2016 01/21/13   Ward, Delice Bison, DO    I have reviewed the patient's current medications.  Labs:  Results for orders placed or performed during the hospital encounter of 09/17/19 (from the past 48 hour(s))  ABO/Rh     Status: None (Preliminary result)   Collection Time: 09/17/19 12:54 PM  Result Value Ref Range   ABO/RH(D)      A POS Performed at [facility], [place] [address], [place], [place] [address]   Urinalysis, Routine w reflex microscopic     Status: Abnormal    Collection Time: 09/17/19  1:51 PM  Result Value Ref Range   Color, Urine RED (A) YELLOW   APPearance TURBID (A) CLEAR   Specific Gravity, Urine  1.005 - 1.030    TEST NOT REPORTED DUE TO COLOR INTERFERENCE OF URINE PIGMENT   pH  5.0 - 8.0    TEST NOT REPORTED DUE TO COLOR INTERFERENCE OF URINE PIGMENT   Glucose, UA (A) NEGATIVE mg/dL    TEST NOT REPORTED DUE TO COLOR INTERFERENCE OF URINE PIGMENT   Hgb urine dipstick (A) NEGATIVE    TEST NOT REPORTED DUE TO COLOR INTERFERENCE OF URINE PIGMENT   Bilirubin Urine (A) NEGATIVE    TEST NOT REPORTED DUE TO COLOR INTERFERENCE OF URINE PIGMENT   Ketones, ur (A) NEGATIVE mg/dL    TEST NOT REPORTED DUE TO COLOR INTERFERENCE OF URINE PIGMENT   Protein, ur (A) NEGATIVE mg/dL    TEST NOT REPORTED DUE TO COLOR INTERFERENCE OF URINE PIGMENT   Nitrite (A) NEGATIVE    TEST NOT REPORTED DUE TO COLOR INTERFERENCE OF URINE PIGMENT   Leukocytes,Ua (A) NEGATIVE    TEST NOT REPORTED DUE TO COLOR INTERFERENCE OF URINE PIGMENT   RBC / HPF >50 (H) 0 - 5 RBC/hpf   WBC, UA >50 (H) 0 - 5 WBC/hpf   Bacteria, UA NONE SEEN NONE SEEN   Crystals PRESENT (A) NEGATIVE    Comment: Performed at [facility], [place] [address], [place], [place] [address]  Comprehensive metabolic panel     Status: Abnormal   Collection Time: 09/17/19  1:55 PM  Result Value Ref Range   Sodium 138 135 - 145 mmol/L   Potassium 4.6 3.5 - 5.1 mmol/L   Chloride 105 98 - 111 mmol/L   CO2 24 22 - 32 mmol/L   Glucose, Bld 104 (H) 70 - 99 mg/dL    Comment: Glucose reference range applies only to samples taken after fasting for at least 8 hours.   BUN 36 (H) 6 - 20 mg/dL   Creatinine, Ser 4.51 (H) 0.61 - 1.24 mg/dL   Calcium 9.0 8.9 - 10.3 mg/dL   Total Protein 7.5 6.5 - 8.1 g/dL   Albumin 3.7 3.5 - 5.0 g/dL   AST 17 15 - 41 U/L   ALT 20 0 - 44 U/L   Alkaline Phosphatase 65 38 - 126 U/L   Total Bilirubin 0.8 0.3 - 1.2 mg/dL   GFR calc non Af Amer 15 (L) >60 mL/min   GFR  calc Af Amer 17 (L) >60 mL/min   Anion gap 9 5 - 15    Comment: Performed at [facility], [place] [address], [place], [place] [address]  CBC     Status: Abnormal   Collection Time: 09/17/19  1:55 PM  Result Value Ref Range   WBC 15.9 (H) 4.0 - 10.5 K/uL   RBC 4.34 4.22 -  5.81 MIL/uL   Hemoglobin 13.5 13.0 - 17.0 g/dL   HCT 40.5 39.0 - 52.0 %   MCV 93.3 80.0 - 100.0 fL   MCH 31.1 26.0 - 34.0 pg   MCHC 33.3 30.0 - 36.0 g/dL   RDW 13.7 11.5 - 15.5 %   Platelets 313 150 - 400 K/uL   nRBC 0.0 0.0 - 0.2 %    Comment: Performed at [facility], [place] [address] [place], [place] [address]  Protime-INR     Status: None   Collection Time: 09/17/19  1:55 PM  Result Value Ref Range   Prothrombin Time 13.6 11.4 - 15.2 seconds   INR 1.1 0.8 - 1.2    Comment: (NOTE) INR goal varies based on device and disease states. Performed at [facility], [place] [address], [place], [place] [address]   Type and screen [place]     Status: None   Collection Time: 09/17/19  2:15 PM  Result Value Ref Range   ABO/RH(D) A POS    Antibody Screen NEG    Sample Expiration      09/20/2019,2359 Performed at [facility], [place] [address] [place], [place] [address]   POC occult blood, ED     Status: None   Collection Time: 09/17/19  3:08 PM  Result Value Ref Range   Fecal Occult Bld NEGATIVE NEGATIVE     ROS:  A comprehensive review of systems was negative except for: Gastrointestinal: positive for abdominal pain and constipation Genitourinary: positive for hematuria and Left-sided flank pain  Physical Exam: Vitals:   09/17/19 1715 09/17/19 2000  BP: (!) 149/76 (!) 159/71  Pulse: 91 83  Resp: 16   Temp:    SpO2: 94%      General: Well-developed black male.  In some distress secondary to abdominal discomfort HEENT: Pupils are equal round reactive to light, extraocular motions are intact, mucous membranes are  moist Neck: No JVD appreciated Heart: Regular rate and rhythm Lungs: Mostly clear, poor effort secondary to abdominal pain Abdomen: Distended, positive bowel sounds, definite discomfort to palpation of left kidney area.  Some guarding Extremities: No clubbing, cyanosis, or edema Skin: Warm and dry Neuro: Alert and nonfocal  Assessment/Plan: 44 year old black male with a polycystic kidney disease, history of CKD who now presents with gross hematuria and some acute change in kidney function 1.Renal/GU- the patient had had CKD dating back to 2017.  He likely had these kidney cysts at that time.  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  His kidney function is clearly different than what it was 4 years ago.  Is difficult to know if this was a chronic change or more of an acute change.  He clearly has suffered some type of urologic event in the last 48 hours.  This could be consistent with a ruptured cyst versus kidney stone that appears to have passed.  Since there is no active obstruction the treatment really is supportive.  Agree with admission, IV fluids as he is not oliguric and monitor for hematuria clearing.  I do not suspect he has nephritic syndrome given his obvious kidney cysts.  He does not appear to be uremic.  Any acute change in kidney function that was caused by this urologic event hopefully will improve.  Is really difficult to say what his renal baseline is at this time, will need to trend kidney function.  No indication for  dialysis at this time 2. Hypertension/volume  -hypertension that has been longstanding and not treated.  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  This could be an exacerbating factor to his CKD.  Amlodipine and hydralazine have been ordered.   3. Anemia  -hemoglobin greater than 13.  This would argue against significant chronic kidney disease, more supports acute kidney injury 4.  Hematuria-as above  thinking it could be consistent with a ruptured kidney cyst versus kidney stone that has passed.  No current urinary obstruction.  IV fluids and Rocephin per primary team.  Since no obvious urinary obstruction do not feel he needs acute urology involvement   Louis Meckel 09/17/2019, 9:13 PM

## 2019-09-17 NOTE — ED Provider Notes (Signed)
[facility] DEPT Provider Note   CSN: [id number] Arrival date & time: 09/17/19  1239     History Chief Complaint  Patient presents with  . Blood In Stools  . Abdominal Pain    Kevin Todd is a 44 y.o. male.  He has a history of hypertension but does not take medicines due to lack of insurance or doctor.  He is complaining of a few days of left-sided upper and lower abdominal pain.  Associated with blood clots in his urine.  That seems to have resolved but starting last night he had blood in his stool with clots.  Abdominal distention.  Nausea.  Feeling cold.  Went to [facility] but the wait was too long so left.  No prior abdominal surgeries.  No known fever but does feel cold.  No chest pain or shortness of breath.  The history is provided by the patient.  Abdominal Pain Pain location:  LUQ and LLQ Pain quality: cramping   Pain radiates to:  Does not radiate Pain severity:  Moderate Onset quality:  Gradual Timing:  Constant Progression:  Worsening Chronicity:  New Context: laxative use   Context: not previous surgeries, not recent travel and not trauma   Relieved by:  Nothing Worsened by:  Bowel movements Ineffective treatments:  OTC medications Associated symptoms: chills, hematochezia, hematuria, melena and nausea   Associated symptoms: no chest pain, no cough, no dysuria, no fever, no hematemesis, no shortness of breath, no sore throat and no vomiting        Past Medical History:  Diagnosis Date  . Hypertension   . Pneumothorax on right     Patient Active Problem List   Diagnosis Date Noted  . Chronic kidney disease   . Chest pain 02/28/2016  . Acute kidney injury ([place]) 02/28/2016  . Morbid obesity ([place]) 02/28/2016  . Tobacco abuse 02/28/2016  . Hypertensive emergency 02/28/2016  . Spontaneous pneumothorax 01/24/2013    History reviewed. No pertinent surgical history.     History reviewed. No pertinent family history.  Social  History   Tobacco Use  . Smoking status: Current Every Day Smoker  . Smokeless tobacco: Never Used  Substance Use Topics  . Alcohol use: Yes  . Drug use: No    Home Medications Prior to Admission medications   Medication Sig Start Date End Date Taking? Authorizing Provider  amLODipine (NORVASC) 10 MG tablet Take 1 tablet (10 mg total) by mouth daily. 02/29/16   Reyne Dumas, MD  atorvastatin (LIPITOR) 20 MG tablet Take 1 tablet (20 mg total) by mouth daily. 02/29/16   Reyne Dumas, MD  meclizine (ANTIVERT) 50 MG tablet Take 1 tablet (50 mg total) by mouth 3 (three) times daily as needed. Patient not taking: Reported on 02/28/2016 01/21/13   Ward, Delice Bison, DO  nicotine (NICODERM CQ - DOSED IN MG/24 HOURS) 21 mg/24hr patch Place 1 patch (21 mg total) onto the skin daily. 02/29/16   Reyne Dumas, MD  ondansetron (ZOFRAN) 4 MG tablet Take 1 tablet (4 mg total) by mouth every 6 (six) hours. Patient not taking: Reported on 02/28/2016 01/21/13   Ward, Delice Bison, DO    Allergies    Patient has no known allergies.  Review of Systems   Review of Systems  Constitutional: Positive for chills. Negative for fever.  HENT: Negative for sore throat.   Eyes: Negative for visual disturbance.  Respiratory: Negative for cough and shortness of breath.   Cardiovascular: Negative for chest  pain.  Gastrointestinal: Positive for abdominal pain, hematochezia, melena and nausea. Negative for hematemesis and vomiting.  Genitourinary: Positive for hematuria. Negative for dysuria.  Musculoskeletal: Negative for neck pain.  Skin: Negative for rash.  Neurological: Negative for headaches.    Physical Exam Updated Vital Signs BP (!) 173/113 (BP Location: Left Arm)   Pulse 95   Temp 98.7 F (37.1 C) (Oral)   Resp 18   SpO2 98%   Physical Exam Vitals and nursing note reviewed.  Constitutional:      Appearance: He is well-developed.  HENT:     Head: Normocephalic and atraumatic.  Eyes:      Conjunctiva/sclera: Conjunctivae normal.  Cardiovascular:     Rate and Rhythm: Normal rate and regular rhythm.     Heart sounds: No murmur.  Pulmonary:     Effort: Pulmonary effort is normal. No respiratory distress.     Breath sounds: Normal breath sounds.  Abdominal:     Palpations: Abdomen is soft.     Tenderness: There is abdominal tenderness in the left upper quadrant and left lower quadrant. There is no guarding or rebound.  Musculoskeletal:        General: No deformity or signs of injury. Normal range of motion.     Cervical back: Neck supple.  Skin:    General: Skin is warm and dry.     Capillary Refill: Capillary refill takes less than 2 seconds.  Neurological:     General: No focal deficit present.     Mental Status: He is alert.     ED Results / Procedures / Treatments   Labs (all labs ordered are listed, but only abnormal results are displayed) Labs Reviewed  COMPREHENSIVE METABOLIC PANEL - Abnormal; Notable for the following components:      Result Value   Glucose, Bld 104 (*)    BUN 36 (*)    Creatinine, Ser 4.51 (*)    GFR calc non Af Amer 15 (*)    GFR calc Af Amer 17 (*)    All other components within normal limits  CBC - Abnormal; Notable for the following components:   WBC 15.9 (*)    All other components within normal limits  URINE CULTURE  PROTIME-INR  URINALYSIS, ROUTINE W REFLEX MICROSCOPIC  POC OCCULT BLOOD, ED  TYPE AND SCREEN  ABO/RH    EKG None  Radiology No results found.  Procedures Procedures (including critical care time)  Medications Ordered in ED Medications  iohexol (OMNIPAQUE) 9 MG/ML oral solution (has no administration in time range)  sodium chloride 0.9 % bolus 1,000 mL (0 mLs Intravenous Stopped 09/17/19 1503)  morphine 4 MG/ML injection 4 mg (4 mg Intravenous Given 09/17/19 1413)  ondansetron (ZOFRAN) injection 4 mg (4 mg Intravenous Given 09/17/19 1413)    ED Course  I have reviewed the triage vital signs and the  nursing notes.  Pertinent labs & imaging results that were available during my care of the patient were reviewed by me and considered in my medical decision making (see chart for details).    MDM Rules/Calculators/A&P                     This patient complains of abdominal pain and rectal bleeding; this involves an extensive number of treatment Options and is a complaint that carries with it a high risk of complications and Morbidity. The differential includes diverticulitis, AVM, upper GI bleed, perforation, hemorrhoids  I ordered, reviewed and interpreted labs, which  included elevated white count of 15.9, stable hemoglobin of 13.  Chemistries normal other than elevated BUN at 36 and creatinine markedly elevated at 4.5.  Normal LFTs.  Coags essentially normal with an INR of 1.1. I ordered medication IV fluids morphine and Zofran with improvement in his symptoms I ordered imaging studies which included CT abdomen pelvis which is pending at time of handoff to oncoming attending Additional history obtained from patient's mother Previous records obtained and reviewed in epic including labs from prior visit at [facility] few days ago  After the interventions stated above, I reevaluated the patient and found patient's pain to be controlled.  His CT is pending at time of discharge.  Signed out to Dr. Sherry Ruffing with plan for anticipation of discharge for his AKI and his rectal bleeding.  Patient is feeling better he would need close follow-up for management of his blood pressure and his AKI.  Final Clinical Impression(s) / ED Diagnoses Final diagnoses:  LLQ abdominal pain  Rectal bleeding  AKI (acute kidney injury) ([place])  Essential hypertension    Rx / DC Orders ED Discharge Orders    None       Hayden Rasmussen, MD 09/17/19 1931

## 2019-09-18 DIAGNOSIS — Z720 Tobacco use: Secondary | ICD-10-CM

## 2019-09-18 DIAGNOSIS — Q613 Polycystic kidney, unspecified: Secondary | ICD-10-CM

## 2019-09-18 DIAGNOSIS — I160 Hypertensive urgency: Secondary | ICD-10-CM

## 2019-09-18 DIAGNOSIS — N179 Acute kidney failure, unspecified: Principal | ICD-10-CM

## 2019-09-18 DIAGNOSIS — N390 Urinary tract infection, site not specified: Secondary | ICD-10-CM

## 2019-09-18 DIAGNOSIS — R310 Gross hematuria: Secondary | ICD-10-CM

## 2019-09-18 LAB — LIPID PANEL
Cholesterol: 190 mg/dL (ref 0–200)
HDL: 39 mg/dL — ABNORMAL LOW (ref >40)
LDL Cholesterol: 126 mg/dL — ABNORMAL HIGH (ref 0–99)
Total CHOL/HDL Ratio: 4.9 RATIO
Triglycerides: 124 mg/dL (ref <150)
VLDL: 25 mg/dL (ref 0–40)

## 2019-09-18 LAB — URINE CULTURE: Culture: NO GROWTH

## 2019-09-18 LAB — RENAL FUNCTION PANEL
Albumin: 3 g/dL — ABNORMAL LOW (ref 3.5–5.0)
Anion gap: 11 (ref 5–15)
BUN: 39 mg/dL — ABNORMAL HIGH (ref 6–20)
CO2: 21 mmol/L — ABNORMAL LOW (ref 22–32)
Calcium: 8.5 mg/dL — ABNORMAL LOW (ref 8.9–10.3)
Chloride: 105 mmol/L (ref 98–111)
Creatinine, Ser: 4.53 mg/dL — ABNORMAL HIGH (ref 0.61–1.24)
GFR calc Af Amer: 17 mL/min — ABNORMAL LOW (ref >60)
GFR calc non Af Amer: 15 mL/min — ABNORMAL LOW (ref >60)
Glucose, Bld: 98 mg/dL (ref 70–99)
Phosphorus: 4.2 mg/dL (ref 2.5–4.6)
Potassium: 4.3 mmol/L (ref 3.5–5.1)
Sodium: 137 mmol/L (ref 135–145)

## 2019-09-18 LAB — COMPREHENSIVE METABOLIC PANEL
ALT: 18 U/L (ref 0–44)
AST: 15 U/L (ref 15–41)
Albumin: 3 g/dL — ABNORMAL LOW (ref 3.5–5.0)
Alkaline Phosphatase: 57 U/L (ref 38–126)
Anion gap: 12 (ref 5–15)
BUN: 38 mg/dL — ABNORMAL HIGH (ref 6–20)
CO2: 20 mmol/L — ABNORMAL LOW (ref 22–32)
Calcium: 8.5 mg/dL — ABNORMAL LOW (ref 8.9–10.3)
Chloride: 105 mmol/L (ref 98–111)
Creatinine, Ser: 4.59 mg/dL — ABNORMAL HIGH (ref 0.61–1.24)
GFR calc Af Amer: 17 mL/min — ABNORMAL LOW (ref >60)
GFR calc non Af Amer: 14 mL/min — ABNORMAL LOW (ref >60)
Glucose, Bld: 96 mg/dL (ref 70–99)
Potassium: 4.4 mmol/L (ref 3.5–5.1)
Sodium: 137 mmol/L (ref 135–145)
Total Bilirubin: 0.7 mg/dL (ref 0.3–1.2)
Total Protein: 6.4 g/dL — ABNORMAL LOW (ref 6.5–8.1)

## 2019-09-18 LAB — CBC WITH DIFFERENTIAL/PLATELET
Abs Immature Granulocytes: 0.02 10*3/uL (ref 0.00–0.07)
Basophils Absolute: 0 10*3/uL (ref 0.0–0.1)
Basophils Relative: 0 %
Eosinophils Absolute: 0.1 10*3/uL (ref 0.0–0.5)
Eosinophils Relative: 1 %
HCT: 36.8 % — ABNORMAL LOW (ref 39.0–52.0)
Hemoglobin: 11.9 g/dL — ABNORMAL LOW (ref 13.0–17.0)
Immature Granulocytes: 0 %
Lymphocytes Relative: 15 %
Lymphs Abs: 1.4 10*3/uL (ref 0.7–4.0)
MCH: 30.4 pg (ref 26.0–34.0)
MCHC: 32.3 g/dL (ref 30.0–36.0)
MCV: 93.9 fL (ref 80.0–100.0)
Monocytes Absolute: 0.7 10*3/uL (ref 0.1–1.0)
Monocytes Relative: 8 %
Neutro Abs: 7.3 10*3/uL (ref 1.7–7.7)
Neutrophils Relative %: 76 %
Platelets: 289 10*3/uL (ref 150–400)
RBC: 3.92 MIL/uL — ABNORMAL LOW (ref 4.22–5.81)
RDW: 13.9 % (ref 11.5–15.5)
WBC: 9.6 10*3/uL (ref 4.0–10.5)
nRBC: 0 % (ref 0.0–0.2)

## 2019-09-18 LAB — HIV ANTIBODY (ROUTINE TESTING W REFLEX): HIV Screen 4th Generation wRfx: NONREACTIVE

## 2019-09-18 LAB — ABO/RH: ABO/RH(D): A POS

## 2019-09-18 LAB — OCCULT BLOOD X 1 CARD TO LAB, STOOL: Fecal Occult Bld: NEGATIVE

## 2019-09-18 MED ORDER — AMLODIPINE BESYLATE 10 MG PO TABS
10.0000 mg | ORAL_TABLET | Freq: Every day | ORAL | Status: DC
  Administered 2019-09-18 – 2019-09-19 (×2): 10 mg via ORAL
  Filled 2019-09-18 (×2): qty 1

## 2019-09-18 NOTE — Progress Notes (Signed)
[facility] Progress Note  Subjective: pt seen in room, no further hematuria noted. L flank pain is resolving.   Vitals:   09/18/19 0236 09/18/19 0540 09/18/19 0834 09/18/19 1000  BP: 133/64 (!) 142/90  (!) 152/82  Pulse: 71 72  76  Resp: 20   16  Temp: 98.4 F (36.9 C) 97.6 F (36.4 C)  97.9 F (36.6 C)  TempSrc: Oral Oral  Oral  SpO2: 93% 97%  99%  Weight:   (!) 137.8 kg   Height:   5' 9" (1.753 m)     Exam:  alert, obese AAM no distress, calm   No jvd   Chest cta bilat    Cor reg no RG    Abd soft ntnd obese    Ext no sig leg or UE edema    NF, Ox 3      CT abd > IMPRESSION: Multi cystic replacement of both kidneys suggestive of polycystic kidney disease, however no prior exams are available for comparison. There is left renal enlargement compared to right, with prominence of the left renal collecting system, perinephric and periureteric edema. Mild right perinephric edema to a lesser extent. Findings are suspicious for urinary tract infection. Possibility of recently passed renal calculus is also considered. There is no evidence of obstructive uropathy.    Renal US - both kidneys enlarged (15 cm) and largely replaced by cysts..    UA 09/15/19 - turbid, red, large Hb, 100 prot, > 50 rbc/ wbc    UNa 46  Ucreat 100  Assessment/ Plan: 1. AoCKD - baseline creat from 2017 is 1.9- 2.2, eGFR 40- 48.  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 No specific Rx for cystic renal disease acutely.  No signs of uremia or volume overload. BP's high on admission but improving.  HTN can be contributor to CKD.  Not sure if this is new baseline or has had AKI event. Will watch renal function another 24 hrs and if stable can probably be dc'd w/ renal f/u.   2. Gross hematuria - likely ruptured renal cyst vs ureteral stone, however no sign of obstruction and hematuria seems to be improving. No indication for acute intervention.  3. HTN - BP's  under reasonable control now w/ norvasc/ hydralazine.     Rob Schertz 09/18/2019, 1:51 PM   Recent Labs  Lab 09/17/19 1355 09/17/19 1355 09/17/19 2120 09/17/19 2245 09/18/19 0508  K 4.6  --   --   --  4.4  4.3  BUN 36*  --   --   --  38*  39*  CREATININE 4.51*  --   --   --  4.59*  4.53*  CALCIUM 9.0  --   --   --  8.5*  8.5*  PHOS  --   --   --  3.7 4.2  HGB 13.5   < > 12.0*  --  11.9*   < > = values in this interval not displayed.   Inpatient medications: . amLODipine  10 mg Oral Daily  . atorvastatin  20 mg Oral Daily  . hydrALAZINE  25 mg Oral Q8H  . sodium chloride flush  3 mL Intravenous Q12H   . cefTRIAXone (ROCEPHIN)  IV 1 g (09/17/19 2144)   acetaminophen **OR** acetaminophen, albuterol, HYDROmorphone (DILAUDID) injection, labetalol, ondansetron **OR** ondansetron (ZOFRAN) IV, oxyCODONE, senna-docusate, sorbitol

## 2019-09-18 NOTE — Consult Note (Signed)
Referring Provider: Dr. Raiford Noble Primary Care Physician:  Patient, No Pcp Per Primary Gastroenterologist:  Althia Forts  Reason for Consultation:  Rectal bleeding  HPI: Kevin Todd is a 44 y.o. male with past medical history of HTN and obesity (BMI 44.88) currently hospitalized for acute renal failure presenting for consultation of rectal bleeding.  Patient reports recent constipation for which he initiated MiraLAX.  He was finally able to have a bowel movement a few days ago but noted bright red blood per rectum mixed with stool.  He did not have any associated rectal pain.  He continued to see rectal bleeding for 3 days but has not had any further rectal bleeding since admission and reports a normal colored stool without any melena or hematochezia today.  He endorses continued hematuria.  Patient reports prior episodes of intermittent constipation but states this episode was more severe.  He has never had any rectal bleeding.  He does not typically have diarrhea but has had some looser stools after initiating MiraLAX.    Patient also notes left lower quadrant abdominal pain, but this has improved since admission.  He has had a decreased appetite for few days but prior to his episode of acute renal failure, he had a normal appetite.  Has not noted any unexplained weight loss at home but does note that his weight has decreased since hospitalization per documented weights (?fluid loss vs discrepancy in scales: 320lb on 5/3 and 303 on 5/6.)  Patient denies dysphagia, heartburn, chest pain, shortness of breath.  He denies any NSAID, aspirin, or blood thinner use.  Patient has never had a colonoscopy or EGD.  Reports a grandfather with history of colon cancer (diagnosed in his 55s) and a maternal cousin with history of colon cancer (diagnosed at age 78).  Past Medical History:  Diagnosis Date  . Hypertension   . Pneumothorax on right     History reviewed. No pertinent surgical  history.  Prior to Admission medications   Medication Sig Start Date End Date Taking? Authorizing Provider  Bisacodyl (LAXATIVE PO) Take 1 tablet by mouth daily as needed (constipation).   Yes [provider]  amLODipine (NORVASC) 10 MG tablet Take 1 tablet (10 mg total) by mouth daily. Patient not taking: Reported on 09/17/2019 02/29/16   Reyne Dumas, MD  atorvastatin (LIPITOR) 20 MG tablet Take 1 tablet (20 mg total) by mouth daily. Patient not taking: Reported on 09/17/2019 02/29/16   Reyne Dumas, MD  meclizine (ANTIVERT) 50 MG tablet Take 1 tablet (50 mg total) by mouth 3 (three) times daily as needed. Patient not taking: Reported on 02/28/2016 01/21/13   Ward, Delice Bison, DO  nicotine (NICODERM CQ - DOSED IN MG/24 HOURS) 21 mg/24hr patch Place 1 patch (21 mg total) onto the skin daily. Patient not taking: Reported on 09/17/2019 02/29/16   Reyne Dumas, MD  ondansetron (ZOFRAN) 4 MG tablet Take 1 tablet (4 mg total) by mouth every 6 (six) hours. Patient not taking: Reported on 02/28/2016 01/21/13   Ward, Delice Bison, DO    Scheduled Meds: . amLODipine  10 mg Oral Daily  . atorvastatin  20 mg Oral Daily  . hydrALAZINE  25 mg Oral Q8H  . sodium chloride flush  3 mL Intravenous Q12H   Continuous Infusions: . cefTRIAXone (ROCEPHIN)  IV 1 g (09/17/19 2144)   PRN Meds:.acetaminophen **OR** acetaminophen, albuterol, HYDROmorphone (DILAUDID) injection, labetalol, ondansetron **OR** ondansetron (ZOFRAN) IV, oxyCODONE, senna-docusate, sorbitol  Allergies as of 09/17/2019  . (No Known  Allergies)    Family History  Problem Relation Age of Onset  . Hypertension Mother     Social History   Socioeconomic History  . Marital status: Single    Spouse name: Not on file  . Number of children: Not on file  . Years of education: Not on file  . Highest education level: Not on file  Occupational History  . Not on file  Tobacco Use  . Smoking status: Current Every Day Smoker  . Smokeless  tobacco: Never Used  Substance and Sexual Activity  . Alcohol use: Yes  . Drug use: No  . Sexual activity: Not on file  Other Topics Concern  . Not on file  Social History Narrative  . Not on file   Social Determinants of Health   Financial Resource Strain:   . Difficulty of Paying Living Expenses:   Food Insecurity:   . Worried About Charity fundraiser in the Last Year:   . Arboriculturist in the Last Year:   Transportation Needs:   . Film/video editor (Medical):   Marland Kitchen Lack of Transportation (Non-Medical):   Physical Activity:   . Days of Exercise per Week:   . Minutes of Exercise per Session:   Stress:   . Feeling of Stress :   Social Connections:   . Frequency of Communication with Friends and Family:   . Frequency of Social Gatherings with Friends and Family:   . Attends Religious Services:   . Active Member of Clubs or Organizations:   . Attends Archivist Meetings:   Marland Kitchen Marital Status:   Intimate Partner Violence:   . Fear of Current or Ex-Partner:   . Emotionally Abused:   Marland Kitchen Physically Abused:   . Sexually Abused:     Review of Systems: Review of Systems  Constitutional: Negative for chills and fever.  HENT: Negative for hearing loss and tinnitus.   Eyes: Negative for blurred vision and pain.  Respiratory: Negative for cough and shortness of breath.   Cardiovascular: Negative for chest pain and palpitations.  Gastrointestinal: Positive for abdominal pain (LLQ) and diarrhea (after Miralax use). Negative for blood in stool, constipation, heartburn, melena, nausea and vomiting.  Genitourinary: Positive for hematuria. Negative for dysuria.  Musculoskeletal: Negative for falls and joint pain.  Skin: Negative for itching and rash.  Neurological: Negative for dizziness and loss of consciousness.  Endo/Heme/Allergies: Negative for polydipsia. Does not bruise/bleed easily.  Psychiatric/Behavioral: Negative for depression. The patient is not  nervous/anxious.     Physical Exam: Vital signs: Vitals:   09/18/19 0540 09/18/19 1000  BP: (!) 142/90 (!) 152/82  Pulse: 72 76  Resp:  16  Temp: 97.6 F (36.4 C) 97.9 F (36.6 C)  SpO2: 97% 99%   Last BM Date: 09/17/19  Physical Exam  Constitutional: He is oriented to person, place, and time. He appears well-developed and well-nourished. No distress.  Obese  HENT:  Head: Normocephalic and atraumatic.  Eyes: Conjunctivae and EOM are normal. No scleral icterus.  Cardiovascular: Normal rate, regular rhythm and normal heart sounds.  Pulmonary/Chest: Effort normal and breath sounds normal. No respiratory distress.  Abdominal: Soft. Bowel sounds are normal. He exhibits no distension and no mass. There is no abdominal tenderness. There is no rebound and no guarding.  obese  Musculoskeletal:        General: No deformity or edema.     Cervical back: Normal range of motion and neck supple.  Neurological: He is  alert and oriented to person, place, and time.  Skin: Skin is warm and dry.  Psychiatric: He has a normal mood and affect. His behavior is normal.    GI:  Lab Results: Recent Labs    09/17/19 1355 09/17/19 2120 09/18/19 0508  WBC 15.9* 12.6* 9.6  HGB 13.5 12.0* 11.9*  HCT 40.5 36.4* 36.8*  PLT 313 289 289   BMET Recent Labs    09/15/19 2149 09/17/19 1355 09/18/19 0508  NA 139 138 137  137  K 4.5 4.6 4.4  4.3  CL 110 105 105  105  CO2 21* 24 20*  21*  GLUCOSE 112* 104* 96  98  BUN 25* 36* 38*  39*  CREATININE 3.64* 4.51* 4.59*  4.53*  CALCIUM 9.1 9.0 8.5*  8.5*   LFT Recent Labs    09/18/19 0508  PROT 6.4*  ALBUMIN 3.0*  3.0*  AST 15  ALT 18  ALKPHOS 57  BILITOT 0.7   PT/INR Recent Labs    09/17/19 1355  LABPROT 13.6  INR 1.1     Studies/Results: CT Abdomen Pelvis Wo Contrast  Result Date: 09/17/2019 CLINICAL DATA:  Abdominal pain for 2 days. Blood in stool this morning. Blood in urine 2 days ago that is subsided. Left mid and  abdominal pain. Diverticulitis suspected. EXAM: CT ABDOMEN AND PELVIS WITHOUT CONTRAST TECHNIQUE: Multidetector CT imaging of the abdomen and pelvis was performed following the standard protocol without IV contrast. COMPARISON:  None. FINDINGS: Lower chest: Subsegmental atelectasis in the lingula and right lower lobe. No pleural fluid. No confluent airspace disease. Hepatobiliary: Decreased hepatic density consistent with steatosis. No evidence of focal lesion. Gallbladder physiologically distended, no calcified stone. No biliary dilatation. Pancreas: Minimal soft tissue stranding about the pancreatic tail. No pancreatic ductal dilatation. No discrete peripancreatic collection. Spleen: Normal in size without focal abnormality. Adrenals/Urinary Tract: Normal adrenal glands. Mild left renal enlargement (16 cm cranial caudal) and mild right renal atrophy (10.7 cm cranial caudal). There is multi cystic replacement of both kidneys, some of the cysts demonstrating peripheral calcifications. Question of intervening hyperdense cyst versus normal renal parenchyma, incompletely characterized on noncontrast exam. There is left greater than right perinephric edema. Mild left hydronephrosis without ureteral calculi. Mild stranding about the left ureter. Urinary bladder is physiologically distended. No definite bladder wall thickening. Stomach/Bowel: Stomach is unremarkable. No small bowel obstruction or inflammation, administered enteric contrast reaches the colon. Normal contrast filled appendix. No appendicitis. Mild diverticulosis of the distal descending and sigmoid colon without focal diverticulitis. No colonic wall thickening. Vascular/Lymphatic: Faint aortic atherosclerosis. No aortic aneurysm. There multiple prominent periportal and retroperitoneal lymph nodes are not enlarged by size criteria. Reproductive: Prostate is unremarkable. Other: No ascites or free fluid. No free air. No evidence of intra-abdominal abscess.  Musculoskeletal: There are scattered areas of bony sclerosis involving the pelvis, including the right pubic body and proximal inferior pubic ramus, series 2, image 86. Additional sclerotic right ischial lesion, series 2, image 89. Degenerative change involving the spine and both hips. IMPRESSION: 1. Mild distal colonic diverticulosis without diverticulitis. 2. Multi cystic replacement of both kidneys suggestive of polycystic kidney disease, however no prior exams are available for comparison. There is left renal enlargement compared to right, with prominence of the left renal collecting system, perinephric and periureteric edema. Mild right perinephric edema to a lesser extent. Findings are suspicious for urinary tract infection. Possibility of recently passed renal calculus is also considered. There is no evidence of obstructive uropathy. 3. Scattered  areas of higher density within the left kidney may represent normal renal parenchyma interspersed with renal cysts versus hyperdense cysts. Possibility of a solid lesion is difficult to exclude on noncontrast exam. Consider further evaluation with contrast-enhanced exam if renal function persists, MRI would provide greater soft tissue evaluation. This could be performed on an elective basis based on clinical scenario. 4. Sclerotic lesions of the right ischium and right inferior pubic ramus, indeterminate. 5. Hepatic steatosis. Electronically Signed   By: Keith Rake M.D.   On: 09/17/2019 17:25   US Renal  Result Date: 09/17/2019 CLINICAL DATA:  Initial evaluation for worsening renal insufficiency with hematuria. EXAM: RENAL / URINARY TRACT ULTRASOUND COMPLETE COMPARISON:  Prior CT from earlier same day. FINDINGS: Right Kidney: Renal measurements: 13.3 x 8.3 x 6.3 cm = volume: 360.7 mL. Right kidney enlarged and largely replaced with multiple cysts. Two largest cysts are measured. 5.7 x 5.7 x 6.0 cm simple cyst present at the upper pole. 2.6 x 3.3 x 3.2 cm  simple cyst present at the mid-lower pole. The preponderance of the cysts are largely simple in appearance. Suggestion of increased echogenicity within the underlying renal parenchyma. No visible nephrolithiasis or hydronephrosis. No definite solid mass. Left Kidney: Renal measurements: 15.6 x 7.3 x 6.3 cm = volume: 379.1 mL. Left kidney enlarged and largely replaced with multiple cysts. Two largest cysts are measured. 4.8 x 4.8 x 4.1 cm simple cyst present at the upper pole. 4.9 x 5.3 x 4.2 cm predominantly simple cyst present at the lower pole. Remainder of the visualized cyst simple to perhaps minimally complex in appearance. Increased echogenicity within the underlying renal parenchyma. No nephrolithiasis or hydronephrosis. Bladder: Appears normal for degree of bladder distention. Other: None. IMPRESSION: 1. Bilateral renal enlargement with innumerable cysts bilaterally, again suggestive of possible polycystic kidney disease. These cysts are predominantly simple in appearance by ultrasound, although dedicated evaluation with renal mass protocol MRI would be more sensitive in evaluation and characterization of these findings. 2. Increased echogenicity within the underlying renal parenchyma, suggesting medical renal disease. 3. No hydronephrosis or obstructive uropathy. Electronically Signed   By: Jeannine Boga M.D.   On: 09/17/2019 21:21    Impression/ Rectal bleeding, now resolved, in the setting of recent constipation.  Suspect hemorrhoidal bleeding, possibly diverticular bleeding.  FOBT negative today and yesterday.  CT 5/5 showed Mild diverticulosis of the distal descending and sigmoid colon without focal diverticulitis; no colonic wall thickening.  Acute renal failure and hematuria: BUN 38/ Cr 4.59/ GFR 17  Mild normocytic anemia: Hgb 11.9, MCV 93.9  Hypertension, uncontrolled  Morbid obesity  Plan: Recommend outpatient colonoscopy due to worsening constipation and new onset  rectal-bleeding.  Patient is hemodynamically stable and there is no indication for inpatient colonoscopy at this time.  Offered patient the option of inpatient colonoscopy (due to history of medical noncompliance) vs. outpatient colonoscopy and patient prefers to have the procedure completed on an outpatient basis.  [facility] will sign off.  Please contact us if we can be of any further assistance during this hospital stay.    LOS: 1 day   Salley Slaughter  PA-C 09/18/2019, 11:37 AM  Contact #  [phone]

## 2019-09-18 NOTE — Progress Notes (Signed)
PROGRESS NOTE    Kevin Todd  AOZ:[id number] DOB: [birth year] DOA: 09/17/2019 PCP: Patient, No Pcp Per  Brief Narrative: HPI per Dr. Irine Seal on 09/17/2019 Kevin Todd is a 44 y.o. male with medical history significant of uncontrolled hypertension, medication noncompliance due to financial issues per patient, morbid obesity, ongoing tobacco abuse, hyperlipidemia presenting to the ED with a 2 to 3-day history of intermittent gross hematuria, left sided upper and mid lower abdominal pain.  Patient stated the night prior to admission had some blood in his stool with clots.  Patient also with complaints of nausea, constipation.  Patient denies any fevers, no chills, no chest pain, no shortness of breath, no cough, no diarrhea, no emesis, no syncopal episode, no weakness.  Patient does endorse some occasional lightheadedness.  No visual changes.  No asymmetric weakness or numbness.  No hematemesis, no melena.  Patient denies any dysuria.  Patient stated presented to [facility] 1 to 2 days prior to admission however due to long wait he left the emergency room and went home.  Patient presenting back due to ongoing gross hematuria.  ED Course: Patient seen in the ED, compressive metabolic profile with a BUN of 36, creatinine of 4.51 otherwise was within normal limits.  CBC with a leukocytosis with a white count of 15.9 otherwise was within normal limits.  INR noted to be 1.1.  Urinalysis pending.  FOBT negative.  CT abdomen and pelvis with mild distal colonic diverticulosis without diverticulitis, multicystic replacement of both kidneys suggestive of polycystic kidney disease however no prior exams are available for comparison.  Left renal enlargement compared to right with prominence of left renal collecting system, perinephric and periureteric edema.  Mild right perinephric edema to a lesser extent.  Findings are suspicious for UTI.  Possibility of recently passed renal calculus also  considered.  No evidence of obstructive uropathy.  Scattered areas of high density within the left kidney may represent normal renal parenchymal interspersed with renal cysts versus hyperdense cyst.  Possibility of solid lesion difficult to exclude on noncontrast exam.  Consider further evaluation with contrast-enhanced exam and renal function persists, MRI would provide greater soft tissue evaluation.  This could be performed on an elective basis based on clinical scenario.  Sclerotic lesions of the right ischium and right inferior pubic ramus, indeterminate.  Hepatic steatosis.  Within the ED to have a hypertensive urgency with systolic blood pressure of 199/118.  ED physician consulted with nephrology.  Hospitalist were called to admit the patient for further evaluation and management.  **Interim History Function is still trending upwards and nephrology is was consulted.  Patient also complained of some rectal bleeding so GI was consulted.  He was FOBT negative x2 and GI recommended an outpatient diagnostic colonoscopy and they will see the patient in the outpatient setting given that he had a relatively stable hemoglobin.  Recommending watching his renal function another 24 hours and feel that if it is stable he can be discharged with renal follow-up.  His gross hematuria is likely a result of a renal cyst versus a ureteral stone and patient has no signs of obstruction.  Hematuria seems to be improving.  Blood pressure is being controlled with antihypertensives now.  Assessment & Plan:   Principal Problem:   ARF (acute renal failure) (HCC) Active Problems:   Morbid obesity ([place])   Tobacco abuse   Hypertensive urgency   Gross hematuria   Acute pyelonephritis   Complicated UTI (urinary tract infection)  Polycystic kidney disease  acute renal failure versus acute renal failure on chronic kidney disease stage III -Patient presented with a 2 to 3-day history of intermittent gross hematuria,  left-sided abdominal pain, dysuria, nausea.   -Patient with gross hematuria noted at bedside.   -Patient also hypertensive with systolic blood pressures in the 180s noted at bedside.  Patient with creatinine today of 4.53 and up from yesterday slightly but was 3.64 on 5/3.2021.   -Last creatinine noted in system from 02/29/2016 was 1.96.  Questionable etiology.   -Concern for possible nephritic syndrome.   -Patient however, noted to have polycystic kidney disease noted on CT abdomen and pelvis which was done in the ED.   -CT negative for obstructive uropathy.   -Urinalysis and urine culture pending this urinalysis was not tested due to it being bloody.   -UA noted from 09/15/2019 was turbid, red, large hemoglobin, nitrite negative, small leukocytes, 100 protein, few bacteria, greater than 50 WBCs, greater than 50 RBCs.   -Check a urine sodium, urine creatinine, renal ultrasound which is about to be done in the ED.   -Check ANA.  -We will hold off on Foley as patient does not have any obstructive symptoms in she is urinating well.  Nephrology was consulted and they are recommending watching the renal function for another 24 hours  Gross hematuria -Questionable etiology.   -Concern for a passed kidney stone versus ruptured cyst from polycystic kidney disease noted.   -Was going to place a Foley catheter however he is urinating well. -Hematuria seems to be slightly improving  Probable complicated UTI/pyelonephritis -Urinalysis pending but was too bloody to be run but did show greater than 50 RBCs per high-power field and then 50 WBCs -Urine culture still pending -Patient with complaints of dysuria and gross hematuria.   -Urinalysis from 09/15/2019 was turbid, large hemoglobin, small leukocytes, greater than 50 WBCs.   -Repeat urine culture is pending.  Placed empirically on IV Rocephin.  Follow.  Hypertensive urgency -Placed on hydralazine 25 mg 3 times daily.   -Norvasc 10 mg daily.   -IV  labetalol as needed for systolic blood pressure greater than 578 or diastolic blood pressure greater than 110.  Tobacco abuse -Tobacco cessation stressed to patient.  Patient states tried a nicotine patch in the past however unable to tolerate it.  Morbid obesity -Estimated body mass index is 44.88 kg/m as calculated from the following:   Height as of this encounter: 5\' 9"  (1.753 m).   Weight as of this encounter: 137.8 kg. -Weight Loss and Dietary Counseling given    Hyperlipidemia -Check a fasting lipid panel.  Lipid panel showed a total cholesterol/HDL ratio 4.9, cholesterol level 190, HDL 39, LDL 126, triglycerides 124, VLDL 25 - Resume statin.  Sclerotic lesions of the right ischium and right inferior pubic ramus -May need bone survey.  Recent GI bleeding -FOBT x2 was negative -GI consulted and recommending colonoscopy and patient is elected to do this in the outpatient setting. -GI felt the patient had constipation induced hemorrhoids causing the bleeding  Normocytic Anemia -Patient hemoglobin/hematocrit is now 11.9/36.8 -Check anemia panel in a.m. -Continue to monitor for signs and symptoms of bleeding; currently having some gross hematuria but no GI bleeding -Repeat CBC in a.m.  DVT prophylaxis: SCDs; no pharmacologic prophylaxis given gross hematuria Code Status: Full code Family Communication: No family present at bedside  Disposition Plan: Patient is from home and will need to further evaluate his renal function prior to safe discharge  disposition  Status is: Inpatient  Remains inpatient appropriate because:Persistent severe electrolyte disturbances   Dispo: The patient is from: Home              Anticipated d/c is to: Home              Anticipated d/c date is: 2 days              Patient currently is not medically stable to d/c.  Consultants:   Nephrology  Gastroenterology  Discussed Case with Urology Dr. Noah Delaine   Procedures:  None  Antimicrobials:  Anti-infectives (From admission, onward)   Start     Dose/Rate Route Frequency Ordered Stop   09/17/19 2130  cefTRIAXone (ROCEPHIN) 1 g in sodium chloride 0.9 % 100 mL IVPB     1 g 200 mL/hr over 30 Minutes Intravenous Every 24 hours 09/17/19 2121       Subjective: Seen and examined at bedside he states that he is doing okay.  Still having some bleeding in his urine and he was also complaining of some bleeding in his stool.  No nausea or vomiting.  No other concerns or complaints at this time.  Objective: Vitals:   09/18/19 0540 09/18/19 0834 09/18/19 1000 09/18/19 1403  BP: (!) 142/90  (!) 152/82 (!) 159/84  Pulse: 72  76 79  Resp:   16 16  Temp: 97.6 F (36.4 C)  97.9 F (36.6 C) 98.1 F (36.7 C)  TempSrc: Oral  Oral Oral  SpO2: 97%  99% 96%  Weight:  (!) 137.8 kg    Height:  5\' 9"  (1.753 m)      Intake/Output Summary (Last 24 hours) at 09/18/2019 2015 Last data filed at 09/18/2019 1800 Gross per 24 hour  Intake 683 ml  Output --  Net 683 ml   Filed Weights   09/18/19 0834  Weight: (!) 137.8 kg   Examination: Physical Exam:  Constitutional: WN/WD obese African-American male currently in NAD and appears calm and comfortable Eyes: Lids and conjunctivae normal, sclerae anicteric  ENMT: External Ears, Nose appear normal. Grossly normal hearing. Mucous membranes are moist.  Neck: Appears normal, supple, no cervical masses, normal ROM, no appreciable thyromegaly; no JVD Respiratory: Diminished to auscultation bilaterally, no wheezing, rales, rhonchi or crackles. Normal respiratory effort and patient is not tachypenic. No accessory muscle use.  Unlabored breathing Cardiovascular: RRR, no murmurs / rubs / gallops. S1 and S2 auscultated.  Trace extremity edema. 2+ pedal pulses. No carotid bruits.  Abdomen: Soft, non-tender, distended secondary body habitus.  Bowel sounds positive x4.  GU: Deferred. Musculoskeletal: No clubbing / cyanosis of digits/nails.  No joint deformity upper and lower extremities.  Skin: No rashes, lesions, ulcers on limited skin evaluation. No induration; Warm and dry.  Neurologic: CN 2-12 grossly intact with no focal deficits.  Romberg sign cerebellar reflexes not assessed.  Psychiatric: Normal judgment and insight. Alert and oriented x 3. Normal mood and appropriate affect.   Data Reviewed: I have personally reviewed following labs and imaging studies  CBC: Recent Labs  Lab 09/15/19 2149 09/17/19 1355 09/17/19 2120 09/18/19 0508  WBC 12.9* 15.9* 12.6* 9.6  NEUTROABS  --   --  10.0* 7.3  HGB 12.5* 13.5 12.0* 11.9*  HCT 37.8* 40.5 36.4* 36.8*  MCV 92.2 93.3 92.2 93.9  PLT 311 313 289 017   Basic Metabolic Panel: Recent Labs  Lab 09/15/19 2149 09/17/19 1355 09/17/19 2245 09/18/19 0508  NA 139 138  --  137  137  K 4.5 4.6  --  4.4  4.3  CL 110 105  --  105  105  CO2 21* 24  --  20*  21*  GLUCOSE 112* 104*  --  96  98  BUN 25* 36*  --  38*  39*  CREATININE 3.64* 4.51*  --  4.59*  4.53*  CALCIUM 9.1 9.0  --  8.5*  8.5*  PHOS  --   --  3.7 4.2   GFR: Estimated Creatinine Clearance: 28.7 mL/min (A) (by C-G formula based on SCr of 4.53 mg/dL (H)). Liver Function Tests: Recent Labs  Lab 09/17/19 1355 09/18/19 0508  AST 17 15  ALT 20 18  ALKPHOS 65 57  BILITOT 0.8 0.7  PROT 7.5 6.4*  ALBUMIN 3.7 3.0*  3.0*   No results for input(s): LIPASE, AMYLASE in the last 168 hours. No results for input(s): AMMONIA in the last 168 hours. Coagulation Profile: Recent Labs  Lab 09/17/19 1355  INR 1.1   Cardiac Enzymes: No results for input(s): CKTOTAL, CKMB, CKMBINDEX, TROPONINI in the last 168 hours. BNP (last 3 results) No results for input(s): PROBNP in the last 8760 hours. HbA1C: No results for input(s): HGBA1C in the last 72 hours. CBG: No results for input(s): GLUCAP in the last 168 hours. Lipid Profile: Recent Labs    09/18/19 0508  CHOL 190  HDL 39*  LDLCALC 126*  TRIG 124   CHOLHDL 4.9   Thyroid Function Tests: No results for input(s): TSH, T4TOTAL, FREET4, T3FREE, THYROIDAB in the last 72 hours. Anemia Panel: No results for input(s): VITAMINB12, FOLATE, FERRITIN, TIBC, IRON, RETICCTPCT in the last 72 hours. Sepsis Labs: No results for input(s): PROCALCITON, LATICACIDVEN in the last 168 hours.  Recent Results (from the past 240 hour(s))  Urine culture     Status: Abnormal   Collection Time: 09/15/19  9:41 PM   Specimen: Urine, Random  Result Value Ref Range Status   Specimen Description URINE, RANDOM  Final   Special Requests ADDED 0031 09/16/2019  Final   Culture (A)  Final    <10,000 COLONIES/mL INSIGNIFICANT GROWTH Performed at [facility], [address] [place], [place] [address]    Report Status 09/16/2019 FINAL  Final  Respiratory Panel by RT PCR (Flu A&B, Covid) - Nasopharyngeal Swab     Status: None   Collection Time: 09/17/19  8:19 PM   Specimen: Nasopharyngeal Swab  Result Value Ref Range Status   SARS Coronavirus 2 by RT PCR NEGATIVE NEGATIVE Final    Comment: (NOTE) SARS-CoV-2 target nucleic acids are NOT DETECTED. The SARS-CoV-2 RNA is generally detectable in upper respiratoy specimens during the acute phase of infection. The lowest concentration of SARS-CoV-2 viral copies this assay can detect is 131 copies/mL. A negative result does not preclude SARS-Cov-2 infection and should not be used as the sole basis for treatment or other patient management decisions. A negative result may occur with  improper specimen collection/handling, submission of specimen other than nasopharyngeal swab, presence of viral mutation(s) within the areas targeted by this assay, and inadequate number of viral copies (<131 copies/mL). A negative result must be combined with clinical observations, patient history, and epidemiological information. The expected result is Negative. Fact Sheet for Patients:   PinkCheek.be Fact Sheet for Healthcare Providers:  GravelBags.it This test is not yet ap proved or cleared by the [place] FDA and  has been authorized for detection and/or diagnosis of SARS-CoV-2 by FDA under an Emergency Use  Authorization (EUA). This EUA will remain  in effect (meaning this test can be used) for the duration of the COVID-19 declaration under Section 564(b)(1) of the Act, 21 U.S.C. section 360bbb-3(b)(1), unless the authorization is terminated or revoked sooner.    Influenza A by PCR NEGATIVE NEGATIVE Final   Influenza B by PCR NEGATIVE NEGATIVE Final    Comment: (NOTE) The Xpert Xpress SARS-CoV-2/FLU/RSV assay is intended as an aid in  the diagnosis of influenza from Nasopharyngeal swab specimens and  should not be used as a sole basis for treatment. Nasal washings and  aspirates are unacceptable for Xpert Xpress SARS-CoV-2/FLU/RSV  testing. Fact Sheet for Patients: PinkCheek.be Fact Sheet for Healthcare Providers: GravelBags.it This test is not yet approved or cleared by the [place] FDA and  has been authorized for detection and/or diagnosis of SARS-CoV-2 by  FDA under an Emergency Use Authorization (EUA). This EUA will remain  in effect (meaning this test can be used) for the duration of the  Covid-19 declaration under Section 564(b)(1) of the Act, 21  U.S.C. section 360bbb-3(b)(1), unless the authorization is  terminated or revoked. Performed at [facility], [place] [address] [place], [place] [address]      RN Pressure Injury Documentation:     Estimated body mass index is 44.88 kg/m as calculated from the following:   Height as of this encounter: 5\' 9"  (1.753 m).   Weight as of this encounter: 137.8 kg.  Malnutrition Type:      Malnutrition Characteristics:      Nutrition Interventions:       Radiology Studies: CT Abdomen Pelvis Wo Contrast  Result Date: 09/17/2019 CLINICAL DATA:  Abdominal pain for 2 days. Blood in stool this morning. Blood in urine 2 days ago that is subsided. Left mid and abdominal pain. Diverticulitis suspected. EXAM: CT ABDOMEN AND PELVIS WITHOUT CONTRAST TECHNIQUE: Multidetector CT imaging of the abdomen and pelvis was performed following the standard protocol without IV contrast. COMPARISON:  None. FINDINGS: Lower chest: Subsegmental atelectasis in the lingula and right lower lobe. No pleural fluid. No confluent airspace disease. Hepatobiliary: Decreased hepatic density consistent with steatosis. No evidence of focal lesion. Gallbladder physiologically distended, no calcified stone. No biliary dilatation. Pancreas: Minimal soft tissue stranding about the pancreatic tail. No pancreatic ductal dilatation. No discrete peripancreatic collection. Spleen: Normal in size without focal abnormality. Adrenals/Urinary Tract: Normal adrenal glands. Mild left renal enlargement (16 cm cranial caudal) and mild right renal atrophy (10.7 cm cranial caudal). There is multi cystic replacement of both kidneys, some of the cysts demonstrating peripheral calcifications. Question of intervening hyperdense cyst versus normal renal parenchyma, incompletely characterized on noncontrast exam. There is left greater than right perinephric edema. Mild left hydronephrosis without ureteral calculi. Mild stranding about the left ureter. Urinary bladder is physiologically distended. No definite bladder wall thickening. Stomach/Bowel: Stomach is unremarkable. No small bowel obstruction or inflammation, administered enteric contrast reaches the colon. Normal contrast filled appendix. No appendicitis. Mild diverticulosis of the distal descending and sigmoid colon without focal diverticulitis. No colonic wall thickening. Vascular/Lymphatic: Faint aortic atherosclerosis. No aortic aneurysm. There multiple prominent  periportal and retroperitoneal lymph nodes are not enlarged by size criteria. Reproductive: Prostate is unremarkable. Other: No ascites or free fluid. No free air. No evidence of intra-abdominal abscess. Musculoskeletal: There are scattered areas of bony sclerosis involving the pelvis, including the right pubic body and proximal inferior pubic ramus, series 2, image 86. Additional sclerotic right ischial lesion, series 2, image 89. Degenerative  change involving the spine and both hips. IMPRESSION: 1. Mild distal colonic diverticulosis without diverticulitis. 2. Multi cystic replacement of both kidneys suggestive of polycystic kidney disease, however no prior exams are available for comparison. There is left renal enlargement compared to right, with prominence of the left renal collecting system, perinephric and periureteric edema. Mild right perinephric edema to a lesser extent. Findings are suspicious for urinary tract infection. Possibility of recently passed renal calculus is also considered. There is no evidence of obstructive uropathy. 3. Scattered areas of higher density within the left kidney may represent normal renal parenchyma interspersed with renal cysts versus hyperdense cysts. Possibility of a solid lesion is difficult to exclude on noncontrast exam. Consider further evaluation with contrast-enhanced exam if renal function persists, MRI would provide greater soft tissue evaluation. This could be performed on an elective basis based on clinical scenario. 4. Sclerotic lesions of the right ischium and right inferior pubic ramus, indeterminate. 5. Hepatic steatosis. Electronically Signed   By: Keith Rake M.D.   On: 09/17/2019 17:25   US Renal  Result Date: 09/17/2019 CLINICAL DATA:  Initial evaluation for worsening renal insufficiency with hematuria. EXAM: RENAL / URINARY TRACT ULTRASOUND COMPLETE COMPARISON:  Prior CT from earlier same day. FINDINGS: Right Kidney: Renal measurements: 13.3 x 8.3 x  6.3 cm = volume: 360.7 mL. Right kidney enlarged and largely replaced with multiple cysts. Two largest cysts are measured. 5.7 x 5.7 x 6.0 cm simple cyst present at the upper pole. 2.6 x 3.3 x 3.2 cm simple cyst present at the mid-lower pole. The preponderance of the cysts are largely simple in appearance. Suggestion of increased echogenicity within the underlying renal parenchyma. No visible nephrolithiasis or hydronephrosis. No definite solid mass. Left Kidney: Renal measurements: 15.6 x 7.3 x 6.3 cm = volume: 379.1 mL. Left kidney enlarged and largely replaced with multiple cysts. Two largest cysts are measured. 4.8 x 4.8 x 4.1 cm simple cyst present at the upper pole. 4.9 x 5.3 x 4.2 cm predominantly simple cyst present at the lower pole. Remainder of the visualized cyst simple to perhaps minimally complex in appearance. Increased echogenicity within the underlying renal parenchyma. No nephrolithiasis or hydronephrosis. Bladder: Appears normal for degree of bladder distention. Other: None. IMPRESSION: 1. Bilateral renal enlargement with innumerable cysts bilaterally, again suggestive of possible polycystic kidney disease. These cysts are predominantly simple in appearance by ultrasound, although dedicated evaluation with renal mass protocol MRI would be more sensitive in evaluation and characterization of these findings. 2. Increased echogenicity within the underlying renal parenchyma, suggesting medical renal disease. 3. No hydronephrosis or obstructive uropathy. Electronically Signed   By: Jeannine Boga M.D.   On: 09/17/2019 21:21   Scheduled Meds: . amLODipine  10 mg Oral Daily  . atorvastatin  20 mg Oral Daily  . hydrALAZINE  25 mg Oral Q8H  . sodium chloride flush  3 mL Intravenous Q12H   Continuous Infusions: . cefTRIAXone (ROCEPHIN)  IV 1 g (09/17/19 2144)    LOS: 1 day   Kerney Elbe, DO [facility] PAGER is on [place]  If 7PM-7AM, please contact  night-coverage [email or web address]

## 2019-09-19 DIAGNOSIS — N10 Acute tubulo-interstitial nephritis: Secondary | ICD-10-CM

## 2019-09-19 DIAGNOSIS — R1032 Left lower quadrant pain: Secondary | ICD-10-CM

## 2019-09-19 DIAGNOSIS — I10 Essential (primary) hypertension: Secondary | ICD-10-CM

## 2019-09-19 DIAGNOSIS — K625 Hemorrhage of anus and rectum: Secondary | ICD-10-CM

## 2019-09-19 LAB — COMPREHENSIVE METABOLIC PANEL
ALT: 22 U/L (ref 0–44)
AST: 17 U/L (ref 15–41)
Albumin: 3.1 g/dL — ABNORMAL LOW (ref 3.5–5.0)
Alkaline Phosphatase: 58 U/L (ref 38–126)
Anion gap: 10 (ref 5–15)
BUN: 38 mg/dL — ABNORMAL HIGH (ref 6–20)
CO2: 21 mmol/L — ABNORMAL LOW (ref 22–32)
Calcium: 8.6 mg/dL — ABNORMAL LOW (ref 8.9–10.3)
Chloride: 107 mmol/L (ref 98–111)
Creatinine, Ser: 4.18 mg/dL — ABNORMAL HIGH (ref 0.61–1.24)
GFR calc Af Amer: 19 mL/min — ABNORMAL LOW (ref >60)
GFR calc non Af Amer: 16 mL/min — ABNORMAL LOW (ref >60)
Glucose, Bld: 123 mg/dL — ABNORMAL HIGH (ref 70–99)
Potassium: 4.2 mmol/L (ref 3.5–5.1)
Sodium: 138 mmol/L (ref 135–145)
Total Bilirubin: 0.5 mg/dL (ref 0.3–1.2)
Total Protein: 6.6 g/dL (ref 6.5–8.1)

## 2019-09-19 LAB — CBC WITH DIFFERENTIAL/PLATELET
Abs Immature Granulocytes: 0.03 10*3/uL (ref 0.00–0.07)
Basophils Absolute: 0 10*3/uL (ref 0.0–0.1)
Basophils Relative: 0 %
Eosinophils Absolute: 0.1 10*3/uL (ref 0.0–0.5)
Eosinophils Relative: 2 %
HCT: 37.1 % — ABNORMAL LOW (ref 39.0–52.0)
Hemoglobin: 12 g/dL — ABNORMAL LOW (ref 13.0–17.0)
Immature Granulocytes: 0 %
Lymphocytes Relative: 16 %
Lymphs Abs: 1.2 10*3/uL (ref 0.7–4.0)
MCH: 30.3 pg (ref 26.0–34.0)
MCHC: 32.3 g/dL (ref 30.0–36.0)
MCV: 93.7 fL (ref 80.0–100.0)
Monocytes Absolute: 0.6 10*3/uL (ref 0.1–1.0)
Monocytes Relative: 7 %
Neutro Abs: 5.6 10*3/uL (ref 1.7–7.7)
Neutrophils Relative %: 75 %
Platelets: 307 10*3/uL (ref 150–400)
RBC: 3.96 MIL/uL — ABNORMAL LOW (ref 4.22–5.81)
RDW: 13.6 % (ref 11.5–15.5)
WBC: 7.5 10*3/uL (ref 4.0–10.5)
nRBC: 0 % (ref 0.0–0.2)

## 2019-09-19 LAB — MAGNESIUM: Magnesium: 2.8 mg/dL — ABNORMAL HIGH (ref 1.7–2.4)

## 2019-09-19 LAB — ANA: Anti Nuclear Antibody (ANA): NEGATIVE

## 2019-09-19 LAB — PHOSPHORUS: Phosphorus: 4.1 mg/dL (ref 2.5–4.6)

## 2019-09-19 MED ORDER — ACETAMINOPHEN 325 MG PO TABS: 650.0000 mg | ORAL_TABLET | Freq: Four times a day (QID) | ORAL | 0 refills | Status: DC | PRN

## 2019-09-19 MED ORDER — ONDANSETRON HCL 4 MG PO TABS: 4.0000 mg | ORAL_TABLET | Freq: Four times a day (QID) | ORAL | 0 refills | Status: DC | PRN

## 2019-09-19 MED ORDER — OXYCODONE HCL 5 MG PO TABS: 5.0000 mg | ORAL_TABLET | ORAL | 0 refills | Status: DC | PRN

## 2019-09-19 MED ORDER — ATORVASTATIN CALCIUM 20 MG PO TABS
20.0000 mg | ORAL_TABLET | Freq: Every day | ORAL | 1 refills | Status: DC
Start: 2019-09-19 — End: 2020-01-07

## 2019-09-19 MED ORDER — SENNOSIDES-DOCUSATE SODIUM 8.6-50 MG PO TABS: 1.0000 | ORAL_TABLET | Freq: Every evening | ORAL | 0 refills | Status: DC | PRN

## 2019-09-19 MED ORDER — AMLODIPINE BESYLATE 10 MG PO TABS
10.0000 mg | ORAL_TABLET | Freq: Every day | ORAL | 1 refills | Status: AC
Start: 2019-09-19 — End: ?

## 2019-09-19 MED ORDER — HYDRALAZINE HCL 25 MG PO TABS: 25.0000 mg | ORAL_TABLET | Freq: Three times a day (TID) | ORAL | 0 refills | Status: DC

## 2019-09-19 MED ORDER — NICOTINE 21 MG/24HR TD PT24: 21.0000 mg | MEDICATED_PATCH | Freq: Every day | TRANSDERMAL | 0 refills | Status: DC

## 2019-09-19 NOTE — TOC Transition Note (Signed)
Transition of Care [facility]) - CM/SW Discharge Note   Patient Details  Name: Kevin Todd MRN: [id number] Date of Birth: 04-Sep-1975  [facility] CM/SW Contact:  Dessa Phi, RN Phone Number: 09/19/2019, 2:31 PM   Clinical Narrative:       Final next level of care: Home/Self Care Barriers to Discharge: No Barriers Identified   Patient Goals and CMS Choice Patient states their goals for this hospitalization and ongoing recovery are:: go home      Discharge Placement                       Discharge Plan and Services   Discharge Planning Services: CM Consult                                 Social Determinants of Health (SDOH) Interventions     Readmission Risk Interventions No flowsheet data found.

## 2019-09-19 NOTE — Discharge Summary (Addendum)
Physician Discharge Summary  Kevin Todd ZCH:[id number] DOB: 1975-07-05 DOA: 09/17/2019  PCP: Patient, No Pcp Per  Admit date: 09/17/2019 Discharge date: 09/19/2019  Admitted From: Home Disposition: Home  Recommendations for Outpatient Follow-up:  1. Follow up and establish with PCP in 1-2 weeks 2. Follow-up with urology in 1 to 2 weeks; I spoke with Dr. Junious Silk and he will arrange follow-up for you 3. Follow-up with nephrology [facility] within 1 to 2 weeks 4. Follow-up with gastroenterology Dr. Therisa Doyne for outpatient colonoscopy 5. Have PCP follow-up on sclerosing lesions in the pelvis 6. Please obtain CMP/CBC, Mag, Phos in one week 7. Please follow up on the following pending results:  Home Health: No Equipment/Devices: None  Discharge Condition Stable CODE STATUS: FULL CODE Diet recommendation: Heart Health Diet   Brief/Interim Summary: HPI per Dr. Irine Seal on 09/17/2019 Kevin Todd a 44 y.o.malewith medical history significant ofuncontrolled hypertension, medication noncompliance due to financial issues per patient, morbid obesity, ongoing tobacco abuse, hyperlipidemia presenting to the ED with a 2 to 3-day history of intermittent gross hematuria, left sided upper and mid lower abdominal pain. Patient stated the night prior to admission had some blood in his stool with clots. Patient also with complaints of nausea, constipation. Patient denies any fevers, no chills, no chest pain, no shortness of breath, no cough, no diarrhea, no emesis, no syncopal episode, no weakness. Patient does endorse some occasional lightheadedness. No visual changes. No asymmetric weakness or numbness. No hematemesis, no melena. Patient denies any dysuria. Patient stated presented to [facility] 1 to 2 days prior to admission however due to long wait he left the emergency room and went home. Patient presenting back due to ongoing gross hematuria.  ED  Course:Patient seen in the ED, compressive metabolic profile with a BUN of 36, creatinine of 4.51 otherwise was within normal limits. CBC with a leukocytosis with a white count of 15.9 otherwise was within normal limits. INR noted to be 1.1. Urinalysis pending. FOBT negative. CT abdomen and pelvis with mild distal colonic diverticulosis without diverticulitis, multicystic replacement of both kidneys suggestive of polycystic kidney disease however no prior exams are available for comparison. Left renal enlargement compared to right with prominence of left renal collecting system, perinephric and periureteric edema. Mild right perinephric edema to a lesser extent. Findings are suspicious for UTI. Possibility of recently passed renal calculus also considered. No evidence of obstructive uropathy. Scattered areas of high density within the left kidney may represent normal renal parenchymal interspersed with renal cysts versus hyperdense cyst. Possibility of solid lesion difficult to exclude on noncontrast exam. Consider further evaluation with contrast-enhanced exam and renal function persists, MRI would provide greater soft tissue evaluation. This could be performed on an elective basis based on clinical scenario. Sclerotic lesions of the right ischium and right inferior pubic ramus, indeterminate. Hepatic steatosis. Within the ED to have a hypertensive urgency with systolic blood pressure of 199/118. ED physician consulted with nephrology. Hospitalist were called to admit the patient for further evaluation and management.  **Interim History Function is still trending upwards and nephrology is was consulted.  Patient also complained of some rectal bleeding so GI was consulted.  He was FOBT negative x2 and GI recommended an outpatient diagnostic colonoscopy and they will see the patient in the outpatient setting given that he had a relatively stable hemoglobin.  Recommending watching his renal  function another 24 hours and feel that if it is stable he can be discharged with  renal follow-up.  His gross hematuria is likely a result of a renal cyst versus a ureteral stone and patient has no signs of obstruction.  Hematuria seems to be improving but is still present and I spoke with urology and they recommending outpatient follow-up.  Blood pressure is being controlled with antihypertensives now.  Renal function is slowly improving.  Patient will be discharged now that he is cleared from a nephrology perspective given that his renal function is improved and stable.  He will need to follow-up with PCP, urology, nephrology, gastroenterology within 1 to 2 weeks and have an outpatient colonoscopy.  His urine culture did not really grow anything an he likely had asymptomatic pyuria so antibiotics will be stopped.   Discharge Diagnoses:  Principal Problem:   ARF (acute renal failure) (HCC) Active Problems:   Morbid obesity ([place])   Tobacco abuse   Hypertensive urgency   Gross hematuria   Acute pyelonephritis   Complicated UTI (urinary tract infection)   Polycystic kidney disease  Acute renal failure versus acute renal failure on chronic kidney disease stage III Metabolic acidosis -Patient presented with a 2 to 3-day history of intermittent gross hematuria, left-sided abdominal pain, dysuria, nausea.  -Patient with gross hematuria noted at bedside.  -Patient also hypertensive with systolic blood pressures in the 180s noted at bedside. Patient with creatinine today of 4.53 and up from yesterday slightly but was 3.64 on 5/3.2021.;  Renal function slightly improved today his creatinine is 4.18  -Last creatinine noted in system from 02/29/2016 was 1.96. Questionable etiology.  -And a mild metabolic acidosis is a CO2 of 21 -Concern for possible nephritic syndrome but nephrology does not feel strongly.  -Patient however, noted to have polycystic kidney disease noted on CT abdomen and pelvis  which was done in the ED.  -CT negative for obstructive uropathy.  -Urinalysis and urine culture pending this urinalysis was not tested due to it being bloody.  -UA noted from 09/15/2019 was turbid, red, large hemoglobin, nitrite negative, small leukocytes, 100 protein, few bacteria, greater than 50 WBCs, greater than 50 RBCs.  -Check a urine sodium, urine creatinine, renal ultrasound which is about to be done in the ED.  -Check ANA which will need to be followed up in outpatient setting.  -We will hold off on Foley as patient does not have any obstructive symptoms in she is urinating well.  -Nephrology was consulted and since he is improving and had no obstructive symptoms nephrology feels the patient is stable to be discharged home and will need close nephrology follow-up in 1 to 2 weeks.  Gross hematuria -Questionable etiology.  -Concern for a passed kidney stone versus ruptured cyst from polycystic kidney disease noted.  -Was going to place a Foley catheter however he is urinating well. -Hematuria seems to be slightly improving and still persists but I spoke with urology who recommends outpatient follow-up given the patient not obstructed  Probable complicated UTI/pyelonephritis ruled out and was likely asymptomatic pyuria -Urinalysis pending but was too bloody to be run but did show greater than 50 RBCs per high-power field and then 50 WBCs -Urine culture showed no growth to date -Patient with complaints of dysuria and gross hematuria likely from the cyst that burst.  -Urinalysis from 09/15/2019 was turbid, large hemoglobin, small leukocytes, greater than 50 WBCs.   Urine culture from 5 3 showed less than 10,000 colonies of insignificant growth -Repeat urine culture is negative. Placed empirically on IV Rocephin for 3 days.  -Stable and  has no symptoms now.  Hypertensive urgency -Placed on hydralazine 25 mg 3 times daily.  -Norvasc 10 mg daily.  -IV labetalol as needed for  systolic blood pressure greater than 030 or diastolic blood pressure greater than 110. -Tinea with p.o. antihypertensives in the outpatient setting  Tobacco abuse -Tobacco cessation stressed to patient. Patient states tried a nicotine patch in the past however unable to tolerate it.  Morbid obesity -Estimated body mass index is 44.88 kg/m as calculated from the following:   Height as of this encounter: 5\' 9"  (1.753 m).   Weight as of this encounter: 137.8 kg. -Weight Loss and Dietary Counseling given   Hyperlipidemia -Check a fasting lipid panel.  Lipid panel showed a total cholesterol/HDL ratio 4.9, cholesterol level 190, HDL 39, LDL 126, triglycerides 124, VLDL 25 -Resume statin at discharge.  Sclerotic lesions of the right ischium and right inferior pubic ramus -May need bone survey.  Recent GI bleeding -FOBT x2 was negative -GI consulted and recommending colonoscopy and patient is elected to do this in the outpatient setting. -GI felt the patient had constipation induced hemorrhoids causing the bleeding  Normocytic Anemia -Patient hemoglobin/hematocrit is now 11.9/36.8 -Check anemia panel in the outpatient setting -Continue to monitor for signs and symptoms of bleeding; currently having some gross hematuria but no GI bleeding -Repeat CBC within 1 week   Discharge Instructions  Discharge Instructions    Call MD for:  difficulty breathing, headache or visual disturbances   Complete by: As directed    Call MD for:  extreme fatigue   Complete by: As directed    Call MD for:  hives   Complete by: As directed    Call MD for:  persistant dizziness or light-headedness   Complete by: As directed    Call MD for:  persistant nausea and vomiting   Complete by: As directed    Call MD for:  redness, tenderness, or signs of infection (pain, swelling, redness, odor or green/yellow discharge around incision site)   Complete by: As directed    Call MD for:  severe  uncontrolled pain   Complete by: As directed    Call MD for:  temperature >100.4   Complete by: As directed    Diet - low sodium heart healthy   Complete by: As directed    Discharge instructions   Complete by: As directed    You were cared for by a hospitalist during your hospital stay. If you have any questions about your discharge medications or the care you received while you were in the hospital after you are discharged, you can call the unit and ask to speak with the hospitalist on call if the hospitalist that took care of you is not available. Once you are discharged, your primary care physician will handle any further medical issues. Please note that NO REFILLS for any discharge medications will be authorized once you are discharged, as it is imperative that you return to your primary care physician (or establish a relationship with a primary care physician if you do not have one) for your aftercare needs so that they can reassess your need for medications and monitor your lab values.  Follow up with PCP, Nephrology, Urology, and Gastroenterology. Have PCP follow up on Sclerotic lesions of the right ischium and right inferior pubic ramus. Take all medications as prescribed. If symptoms change or worsen please return to the ED for evaluation   Increase activity slowly   Complete by: As directed  Allergies as of 09/19/2019   No Known Allergies     Medication List    STOP taking these medications   LAXATIVE PO   meclizine 50 MG tablet Commonly known as: ANTIVERT     TAKE these medications   acetaminophen 325 MG tablet Commonly known as: TYLENOL Take 2 tablets (650 mg total) by mouth every 6 (six) hours as needed for mild pain (or Fever >/= 101).   amLODipine 10 MG tablet Commonly known as: Norvasc Take 1 tablet (10 mg total) by mouth daily.   atorvastatin 20 MG tablet Commonly known as: Lipitor Take 1 tablet (20 mg total) by mouth daily.   hydrALAZINE 25 MG  tablet Commonly known as: APRESOLINE Take 1 tablet (25 mg total) by mouth every 8 (eight) hours.   nicotine 21 mg/24hr patch Commonly known as: NICODERM CQ - dosed in mg/24 hours Place 1 patch (21 mg total) onto the skin daily.   ondansetron 4 MG tablet Commonly known as: ZOFRAN Take 1 tablet (4 mg total) by mouth every 6 (six) hours as needed for nausea. What changed:   when to take this  reasons to take this   oxyCODONE 5 MG immediate release tablet Commonly known as: Oxy IR/ROXICODONE Take 1 tablet (5 mg total) by mouth every 4 (four) hours as needed for moderate pain.   senna-docusate 8.6-50 MG tablet Commonly known as: Senokot-S Take 1 tablet by mouth at bedtime as needed for mild constipation.      Follow-up Information    Ronnette Juniper, MD Follow up.   Specialty: Gastroenterology Why: Follow up for Outpatient Colonoscopy  Contact information: [place] [address] [phone]        Festus Aloe, MD. Call.   Specialty: Urology Why: Follow up for Hematuria  Contact information: [place] [address] [phone]        [place], [place]. Call.   Why: Follow up for Renal Dysfunction Contact information: [place] [address] [phone]          No Known Allergies  Consultations: Nephrology Gastroenterology Case was discussed with urology  Procedures/Studies: CT Abdomen Pelvis Wo Contrast  Result Date: 09/17/2019 CLINICAL DATA:  Abdominal pain for 2 days. Blood in stool this morning. Blood in urine 2 days ago that is subsided. Left mid and abdominal pain. Diverticulitis suspected. EXAM: CT ABDOMEN AND PELVIS WITHOUT CONTRAST TECHNIQUE: Multidetector CT imaging of the abdomen and pelvis was performed following the standard protocol without IV contrast. COMPARISON:  None. FINDINGS: Lower chest: Subsegmental atelectasis in the lingula and right lower lobe. No pleural fluid. No confluent  airspace disease. Hepatobiliary: Decreased hepatic density consistent with steatosis. No evidence of focal lesion. Gallbladder physiologically distended, no calcified stone. No biliary dilatation. Pancreas: Minimal soft tissue stranding about the pancreatic tail. No pancreatic ductal dilatation. No discrete peripancreatic collection. Spleen: Normal in size without focal abnormality. Adrenals/Urinary Tract: Normal adrenal glands. Mild left renal enlargement (16 cm cranial caudal) and mild right renal atrophy (10.7 cm cranial caudal). There is multi cystic replacement of both kidneys, some of the cysts demonstrating peripheral calcifications. Question of intervening hyperdense cyst versus normal renal parenchyma, incompletely characterized on noncontrast exam. There is left greater than right perinephric edema. Mild left hydronephrosis without ureteral calculi. Mild stranding about the left ureter. Urinary bladder is physiologically distended. No definite bladder wall thickening. Stomach/Bowel: Stomach is unremarkable. No small bowel obstruction or inflammation, administered enteric contrast reaches the colon. Normal  contrast filled appendix. No appendicitis. Mild diverticulosis of the distal descending and sigmoid colon without focal diverticulitis. No colonic wall thickening. Vascular/Lymphatic: Faint aortic atherosclerosis. No aortic aneurysm. There multiple prominent periportal and retroperitoneal lymph nodes are not enlarged by size criteria. Reproductive: Prostate is unremarkable. Other: No ascites or free fluid. No free air. No evidence of intra-abdominal abscess. Musculoskeletal: There are scattered areas of bony sclerosis involving the pelvis, including the right pubic body and proximal inferior pubic ramus, series 2, image 86. Additional sclerotic right ischial lesion, series 2, image 89. Degenerative change involving the spine and both hips. IMPRESSION: 1. Mild distal colonic diverticulosis without  diverticulitis. 2. Multi cystic replacement of both kidneys suggestive of polycystic kidney disease, however no prior exams are available for comparison. There is left renal enlargement compared to right, with prominence of the left renal collecting system, perinephric and periureteric edema. Mild right perinephric edema to a lesser extent. Findings are suspicious for urinary tract infection. Possibility of recently passed renal calculus is also considered. There is no evidence of obstructive uropathy. 3. Scattered areas of higher density within the left kidney may represent normal renal parenchyma interspersed with renal cysts versus hyperdense cysts. Possibility of a solid lesion is difficult to exclude on noncontrast exam. Consider further evaluation with contrast-enhanced exam if renal function persists, MRI would provide greater soft tissue evaluation. This could be performed on an elective basis based on clinical scenario. 4. Sclerotic lesions of the right ischium and right inferior pubic ramus, indeterminate. 5. Hepatic steatosis. Electronically Signed   By: Keith Rake M.D.   On: 09/17/2019 17:25   US Renal  Result Date: 09/17/2019 CLINICAL DATA:  Initial evaluation for worsening renal insufficiency with hematuria. EXAM: RENAL / URINARY TRACT ULTRASOUND COMPLETE COMPARISON:  Prior CT from earlier same day. FINDINGS: Right Kidney: Renal measurements: 13.3 x 8.3 x 6.3 cm = volume: 360.7 mL. Right kidney enlarged and largely replaced with multiple cysts. Two largest cysts are measured. 5.7 x 5.7 x 6.0 cm simple cyst present at the upper pole. 2.6 x 3.3 x 3.2 cm simple cyst present at the mid-lower pole. The preponderance of the cysts are largely simple in appearance. Suggestion of increased echogenicity within the underlying renal parenchyma. No visible nephrolithiasis or hydronephrosis. No definite solid mass. Left Kidney: Renal measurements: 15.6 x 7.3 x 6.3 cm = volume: 379.1 mL. Left kidney enlarged  and largely replaced with multiple cysts. Two largest cysts are measured. 4.8 x 4.8 x 4.1 cm simple cyst present at the upper pole. 4.9 x 5.3 x 4.2 cm predominantly simple cyst present at the lower pole. Remainder of the visualized cyst simple to perhaps minimally complex in appearance. Increased echogenicity within the underlying renal parenchyma. No nephrolithiasis or hydronephrosis. Bladder: Appears normal for degree of bladder distention. Other: None. IMPRESSION: 1. Bilateral renal enlargement with innumerable cysts bilaterally, again suggestive of possible polycystic kidney disease. These cysts are predominantly simple in appearance by ultrasound, although dedicated evaluation with renal mass protocol MRI would be more sensitive in evaluation and characterization of these findings. 2. Increased echogenicity within the underlying renal parenchyma, suggesting medical renal disease. 3. No hydronephrosis or obstructive uropathy. Electronically Signed   By: Jeannine Boga M.D.   On: 09/17/2019 21:21     Subjective: Seen and examined at bedside and states that he had some mild left-sided pain yesterday but is improved.  No nausea or vomiting.  Denies lightheadedness or dizziness.  Continues to have some hematuria but thinks is stable.  No other concerns or complaints at this time is ready to go home and happy that his renal function is slowly improving.  Discharge Exam: Vitals:   09/19/19 0827 09/19/19 0956  BP: (!) 162/89 (!) 151/93  Pulse: 76 72  Resp: 17 18  Temp: 98 F (36.7 C) 97.7 F (36.5 C)  SpO2: 96% 93%   Vitals:   09/19/19 0607 09/19/19 0609 09/19/19 0827 09/19/19 0956  BP:  (!) 142/93 (!) 162/89 (!) 151/93  Pulse:  75 76 72  Resp:  18 17 18   Temp:  98.2 F (36.8 C) 98 F (36.7 C) 97.7 F (36.5 C)  TempSrc:  Oral Oral Oral  SpO2:  96% 96% 93%  Weight: (!) 136.7 kg     Height:       General: Pt is alert, awake, not in acute distress Cardiovascular: RRR, S1/S2 +, no  rubs, no gallops Respiratory: Diminished bilaterally, no wheezing, no rhonchi; unlabored breathing Abdominal: Soft, nontender to palpate, distended secondary body habitus, bowel sounds + Extremities: Trace edema, no cyanosis  The results of significant diagnostics from this hospitalization (including imaging, microbiology, ancillary and laboratory) are listed below for reference.    Microbiology: Recent Results (from the past 240 hour(s))  Urine culture     Status: Abnormal   Collection Time: 09/15/19  9:41 PM   Specimen: Urine, Random  Result Value Ref Range Status   Specimen Description URINE, RANDOM  Final   Special Requests ADDED 0031 09/16/2019  Final   Culture (A)  Final    <10,000 COLONIES/mL INSIGNIFICANT GROWTH Performed at [facility], [address] [place], [place] [address]    Report Status 09/16/2019 FINAL  Final  Urine culture     Status: None   Collection Time: 09/17/19  1:51 PM   Specimen: Urine, Clean Catch  Result Value Ref Range Status   Specimen Description   Final    Urine Performed at [facility] [address], [place], [place] [address]    Special Requests   Final    NONE Performed at [facility], [place] [address], [place], [place] [address]    Culture   Final    NO GROWTH Performed at [facility], [place] [address] [place], [place] [address]    Report Status 09/18/2019 FINAL  Final  Respiratory Panel by RT PCR (Flu A&B, Covid) - Nasopharyngeal Swab     Status: None   Collection Time: 09/17/19  8:19 PM   Specimen: Nasopharyngeal Swab  Result Value Ref Range Status   SARS Coronavirus 2 by RT PCR NEGATIVE NEGATIVE Final    Comment: (NOTE) SARS-CoV-2 target nucleic acids are NOT DETECTED. The SARS-CoV-2 RNA is generally detectable in upper respiratoy specimens during the acute phase of infection. The lowest concentration of SARS-CoV-2 viral copies this assay can detect is 131 copies/mL. A negative  result does not preclude SARS-Cov-2 infection and should not be used as the sole basis for treatment or other patient management decisions. A negative result may occur with  improper specimen collection/handling, submission of specimen other than nasopharyngeal swab, presence of viral mutation(s) within the areas targeted by this assay, and inadequate number of viral copies (<131 copies/mL). A negative result must be combined with clinical observations, patient history, and epidemiological information. The expected result is Negative. Fact Sheet for Patients:  PinkCheek.be Fact Sheet for Healthcare Providers:  GravelBags.it This test is not yet ap proved or cleared by the [place]  FDA and  has been authorized for detection and/or diagnosis of SARS-CoV-2 by FDA under an Emergency Use Authorization (EUA). This EUA will remain  in effect (meaning this test can be used) for the duration of the COVID-19 declaration under Section 564(b)(1) of the Act, 21 U.S.C. section 360bbb-3(b)(1), unless the authorization is terminated or revoked sooner.    Influenza A by PCR NEGATIVE NEGATIVE Final   Influenza B by PCR NEGATIVE NEGATIVE Final    Comment: (NOTE) The Xpert Xpress SARS-CoV-2/FLU/RSV assay is intended as an aid in  the diagnosis of influenza from Nasopharyngeal swab specimens and  should not be used as a sole basis for treatment. Nasal washings and  aspirates are unacceptable for Xpert Xpress SARS-CoV-2/FLU/RSV  testing. Fact Sheet for Patients: PinkCheek.be Fact Sheet for Healthcare Providers: GravelBags.it This test is not yet approved or cleared by the [place] FDA and  has been authorized for detection and/or diagnosis of SARS-CoV-2 by  FDA under an Emergency Use Authorization (EUA). This EUA will remain  in effect (meaning this test can be used) for the  duration of the  Covid-19 declaration under Section 564(b)(1) of the Act, 21  U.S.C. section 360bbb-3(b)(1), unless the authorization is  terminated or revoked. Performed at [facility], [place] [address], [place], [place] [address]   Culture, blood (Routine X 2) w Reflex to ID Panel     Status: None (Preliminary result)   Collection Time: 09/17/19  9:26 PM   Specimen: BLOOD  Result Value Ref Range Status   Specimen Description   Final    BLOOD BLOOD RIGHT FOREARM Performed at [facility] [address], [place], [place] [address]    Special Requests   Final    BOTTLES DRAWN AEROBIC AND ANAEROBIC Blood Culture adequate volume Performed at [place] [address] [place], [place] [address]    Culture   Final    NO GROWTH 1 DAY Performed at [facility], [place] [address], [place], [place] [address]    Report Status PENDING  Incomplete  Culture, blood (Routine X 2) w Reflex to ID Panel     Status: None (Preliminary result)   Collection Time: 09/17/19  9:50 PM   Specimen: BLOOD  Result Value Ref Range Status   Specimen Description   Final    BLOOD BLOOD LEFT FOREARM Performed at [facility] [address] [place], [place] [address]    Special Requests   Final    BOTTLES DRAWN AEROBIC AND ANAEROBIC Blood Culture adequate volume Performed at [facility] [address] [place], [place] [address]    Culture   Final    NO GROWTH 1 DAY Performed at [facility], [place] [address], [place], [place] [address]    Report Status PENDING  Incomplete    Labs: BNP (last 3 results) No results for input(s): BNP in the last 8760 hours. Basic Metabolic Panel: Recent Labs  Lab 09/15/19 2149 09/17/19 1355 09/17/19 2245 09/18/19 0508 09/19/19 0435  NA 139 138  --  137  137 138  K 4.5 4.6  --  4.4  4.3 4.2  CL 110 105  --  105  105 107  CO2 21* 24  --  20*  21* 21*   GLUCOSE 112* 104*  --  96  98 123*  BUN 25* 36*  --  38*  39* 38*  CREATININE 3.64* 4.51*  --  4.59*  4.53* 4.18*  CALCIUM 9.1 9.0  --  8.5*  8.5* 8.6*  MG  --   --   --   --  2.8*  PHOS  --   --  3.7 4.2 4.1   Liver Function Tests: Recent Labs  Lab 09/17/19 1355 09/18/19 0508 09/19/19 0435  AST 17 15 17   ALT 20 18 22   ALKPHOS 65 57 58  BILITOT 0.8 0.7 0.5  PROT 7.5 6.4* 6.6  ALBUMIN 3.7 3.0*  3.0* 3.1*   No results for input(s): LIPASE, AMYLASE in the last 168 hours. No results for input(s): AMMONIA in the last 168 hours. CBC: Recent Labs  Lab 09/15/19 2149 09/17/19 1355 09/17/19 2120 09/18/19 0508 09/19/19 0435  WBC 12.9* 15.9* 12.6* 9.6 7.5  NEUTROABS  --   --  10.0* 7.3 5.6  HGB 12.5* 13.5 12.0* 11.9* 12.0*  HCT 37.8* 40.5 36.4* 36.8* 37.1*  MCV 92.2 93.3 92.2 93.9 93.7  PLT 311 313 289 289 307   Cardiac Enzymes: No results for input(s): CKTOTAL, CKMB, CKMBINDEX, TROPONINI in the last 168 hours. BNP: Invalid input(s): POCBNP CBG: No results for input(s): GLUCAP in the last 168 hours. D-Dimer No results for input(s): DDIMER in the last 72 hours. Hgb A1c No results for input(s): HGBA1C in the last 72 hours. Lipid Profile Recent Labs    09/18/19 0508  CHOL 190  HDL 39*  LDLCALC 126*  TRIG 124  CHOLHDL 4.9   Thyroid function studies No results for input(s): TSH, T4TOTAL, T3FREE, THYROIDAB in the last 72 hours.  Invalid input(s): FREET3 Anemia work up No results for input(s): VITAMINB12, FOLATE, FERRITIN, TIBC, IRON, RETICCTPCT in the last 72 hours. Urinalysis    Component Value Date/Time   COLORURINE RED (A) 09/17/2019 1351   APPEARANCEUR TURBID (A) 09/17/2019 1351   LABSPEC  09/17/2019 1351    TEST NOT REPORTED DUE TO COLOR INTERFERENCE OF URINE PIGMENT   PHURINE  09/17/2019 1351    TEST NOT REPORTED DUE TO COLOR INTERFERENCE OF URINE PIGMENT   GLUCOSEU (A) 09/17/2019 1351    TEST NOT REPORTED DUE TO COLOR INTERFERENCE OF URINE PIGMENT    HGBUR (A) 09/17/2019 1351    TEST NOT REPORTED DUE TO COLOR INTERFERENCE OF URINE PIGMENT   BILIRUBINUR (A) 09/17/2019 1351    TEST NOT REPORTED DUE TO COLOR INTERFERENCE OF URINE PIGMENT   KETONESUR (A) 09/17/2019 1351    TEST NOT REPORTED DUE TO COLOR INTERFERENCE OF URINE PIGMENT   PROTEINUR (A) 09/17/2019 1351    TEST NOT REPORTED DUE TO COLOR INTERFERENCE OF URINE PIGMENT   NITRITE (A) 09/17/2019 1351    TEST NOT REPORTED DUE TO COLOR INTERFERENCE OF URINE PIGMENT   LEUKOCYTESUR (A) 09/17/2019 1351    TEST NOT REPORTED DUE TO COLOR INTERFERENCE OF URINE PIGMENT   Sepsis Labs Invalid input(s): PROCALCITONIN,  WBC,  LACTICIDVEN Microbiology Recent Results (from the past 240 hour(s))  Urine culture     Status: Abnormal   Collection Time: 09/15/19  9:41 PM   Specimen: Urine, Random  Result Value Ref Range Status   Specimen Description URINE, RANDOM  Final   Special Requests ADDED 0031 09/16/2019  Final   Culture (A)  Final    <10,000 COLONIES/mL INSIGNIFICANT GROWTH Performed at [facility], [place] [address], [place], [place] [address]    Report Status 09/16/2019 FINAL  Final  Urine culture     Status: None   Collection Time: 09/17/19  1:51 PM  Specimen: Urine, Clean Catch  Result Value Ref Range Status   Specimen Description   Final    Urine Performed at [facility] [address] [place], [place] [address]    Special Requests   Final    NONE Performed at [facility], [place] [address], [place], [place] [address]    Culture   Final    NO GROWTH Performed at [facility], [place] [address], [place], [place] [address]    Report Status 09/18/2019 FINAL  Final  Respiratory Panel by RT PCR (Flu A&B, Covid) - Nasopharyngeal Swab     Status: None   Collection Time: 09/17/19  8:19 PM   Specimen: Nasopharyngeal Swab  Result Value Ref Range Status   SARS Coronavirus 2 by RT PCR NEGATIVE NEGATIVE Final    Comment:  (NOTE) SARS-CoV-2 target nucleic acids are NOT DETECTED. The SARS-CoV-2 RNA is generally detectable in upper respiratoy specimens during the acute phase of infection. The lowest concentration of SARS-CoV-2 viral copies this assay can detect is 131 copies/mL. A negative result does not preclude SARS-Cov-2 infection and should not be used as the sole basis for treatment or other patient management decisions. A negative result may occur with  improper specimen collection/handling, submission of specimen other than nasopharyngeal swab, presence of viral mutation(s) within the areas targeted by this assay, and inadequate number of viral copies (<131 copies/mL). A negative result must be combined with clinical observations, patient history, and epidemiological information. The expected result is Negative. Fact Sheet for Patients:  PinkCheek.be Fact Sheet for Healthcare Providers:  GravelBags.it This test is not yet ap proved or cleared by the [place] FDA and  has been authorized for detection and/or diagnosis of SARS-CoV-2 by FDA under an Emergency Use Authorization (EUA). This EUA will remain  in effect (meaning this test can be used) for the duration of the COVID-19 declaration under Section 564(b)(1) of the Act, 21 U.S.C. section 360bbb-3(b)(1), unless the authorization is terminated or revoked sooner.    Influenza A by PCR NEGATIVE NEGATIVE Final   Influenza B by PCR NEGATIVE NEGATIVE Final    Comment: (NOTE) The Xpert Xpress SARS-CoV-2/FLU/RSV assay is intended as an aid in  the diagnosis of influenza from Nasopharyngeal swab specimens and  should not be used as a sole basis for treatment. Nasal washings and  aspirates are unacceptable for Xpert Xpress SARS-CoV-2/FLU/RSV  testing. Fact Sheet for Patients: PinkCheek.be Fact Sheet for Healthcare  Providers: GravelBags.it This test is not yet approved or cleared by the [place] FDA and  has been authorized for detection and/or diagnosis of SARS-CoV-2 by  FDA under an Emergency Use Authorization (EUA). This EUA will remain  in effect (meaning this test can be used) for the duration of the  Covid-19 declaration under Section 564(b)(1) of the Act, 21  U.S.C. section 360bbb-3(b)(1), unless the authorization is  terminated or revoked. Performed at [facility], [place] [address], [place],  [address]   Culture, blood (Routine X 2) w Reflex to ID Panel     Status: None (Preliminary result)   Collection Time: 09/17/19  9:26 PM   Specimen: BLOOD  Result Value Ref Range Status   Specimen Description   Final    BLOOD BLOOD RIGHT FOREARM Performed at [facility] [address], [place],  [address]    Special Requests   Final    BOTTLES DRAWN AEROBIC AND ANAEROBIC Blood Culture adequate volume Performed  at [facility], [place] [address], [place], [place] [address]    Culture   Final    NO GROWTH 1 DAY Performed at [facility], [place] [address], [place], [place] [address]    Report Status PENDING  Incomplete  Culture, blood (Routine X 2) w Reflex to ID Panel     Status: None (Preliminary result)   Collection Time: 09/17/19  9:50 PM   Specimen: BLOOD  Result Value Ref Range Status   Specimen Description   Final    BLOOD BLOOD LEFT FOREARM Performed at [facility] [address] [place], [place] [address]    Special Requests   Final    BOTTLES DRAWN AEROBIC AND ANAEROBIC Blood Culture adequate volume Performed at [facility] [address], [place], [place] [address]    Culture   Final    NO GROWTH 1 DAY Performed at [facility], [place] [address] [place], [place] [address]    Report Status PENDING  Incomplete   Time  coordinating discharge: 35 minutes  SIGNED:  Kerney Elbe, DO [facility] 09/19/2019, 1:15 PM Pager is on [place]  If 7PM-7AM, please contact night-coverage [email or web address]

## 2019-09-19 NOTE — TOC Initial Note (Signed)
Transition of Care [facility]) - Initial/Assessment Note    Patient Details  Name: Kevin Todd MRN: [id number] Date of Birth: 01/07/1976  [facility] CM/SW Contact:    Dessa Phi, RN Phone Number: 09/19/2019, 2:29 PM  Clinical Narrative:Patient plans to d/c home.Provided w/pcp-see d/c follow section,[facility],patient works-can afford meds. No further CM needs.                   Expected Discharge Plan: Home/Self Care Barriers to Discharge: Continued Medical Work up   Patient Goals and CMS Choice Patient states their goals for this hospitalization and ongoing recovery are:: go home      Expected Discharge Plan and Services Expected Discharge Plan: Home/Self Care   Discharge Planning Services: CM Consult   Living arrangements for the past 2 months: Single Family Home Expected Discharge Date: 09/19/19                                    Prior Living Arrangements/Services Living arrangements for the past 2 months: Single Family Home   Patient language and need for interpreter reviewed:: Yes Do you feel safe going back to the place where you live?: Yes      Need for Family Participation in Patient Care: No (Comment) Care giver support system in place?: Yes (comment)   Criminal Activity/Legal Involvement Pertinent to Current Situation/Hospitalization: No - Comment as needed  Activities of Daily Living Home Assistive Devices/Equipment: None ADL Screening (condition at time of admission) Patient's cognitive ability adequate to safely complete daily activities?: Yes Is the patient deaf or have difficulty hearing?: No Does the patient have difficulty seeing, even when wearing glasses/contacts?: No Does the patient have difficulty concentrating, remembering, or making decisions?: No Patient able to express need for assistance with ADLs?: No Does the patient have difficulty dressing or bathing?: No Independently performs ADLs?: Yes (appropriate  for developmental age) Communication: Independent Dressing (OT): Independent Grooming: Independent Feeding: Independent Bathing: Independent Toileting: Independent In/Out Bed: Independent Walks in Home: Independent Does the patient have difficulty walking or climbing stairs?: No Weakness of Legs: None Weakness of Arms/Hands: None  Permission Sought/Granted Permission sought to share information with : Case Manager Permission granted to share information with : Yes, Verbal Permission Granted  Share Information with NAME: Case Manager           Emotional Assessment Appearance:: Appears stated age Attitude/Demeanor/Rapport: Gracious Affect (typically observed): Accepting Orientation: : Oriented to Self, Oriented to Place, Oriented to  Time, Oriented to Situation Alcohol / Substance Use: Not Applicable Psych Involvement: No (comment)  Admission diagnosis:  Rectal bleeding [K62.5] ARF (acute renal failure) (HCC) [N17.9] LLQ abdominal pain [R10.32] AKI (acute kidney injury) ([place]) [N17.9] Essential hypertension [I10] Patient Active Problem List   Diagnosis Date Noted  . ARF (acute renal failure) ([place]) 09/17/2019  . Hypertensive urgency 09/17/2019  . Gross hematuria 09/17/2019  . Acute pyelonephritis 09/17/2019  . Complicated UTI (urinary tract infection) 09/17/2019  . Polycystic kidney disease 09/17/2019  . Essential hypertension   . Chronic kidney disease   . Chest pain 02/28/2016  . Acute kidney injury ([place]) 02/28/2016  . Morbid obesity ([place]) 02/28/2016  . Tobacco abuse 02/28/2016  . Hypertensive emergency 02/28/2016  . Spontaneous pneumothorax 01/24/2013   PCP:  Patient, No Pcp Per Pharmacy:   [id number] [place], [place] [address]  Phone: [phone] Fax: [phone]  [id number] [place], [place]  [place] [address] Phone: [phone] Fax: [phone]     Social Determinants of Health (SDOH) Interventions    Readmission Risk Interventions No flowsheet data found.

## 2019-09-19 NOTE — Progress Notes (Addendum)
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 There is left renal enlargement compared to right, with prominence of the left renal collecting system, perinephric and periureteric edema. Mild right perinephric edema to a lesser extent. Findings are suspicious for urinary tract infection. Possibility of recently passed renal calculus is also considered. There is no evidence of obstructive uropathy.    Renal US - both kidneys enlarged (15 cm) and largely replaced by cysts..    UA 09/15/19 - turbid, red, large Hb, 100 prot, > 50 rbc/ wbc    UNa 46  Ucreat 100  Assessment/ Plan: 1. AoCKD 3 - baseline creat from 2017 is 1.9- 2.2, eGFR 40- 48.  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 Has either AoCKD3 or new CKD IV, cannot be sure now. No specific Rx for cystic renal disease acutely.  BP's high on admission but improved now. No sign of obstruction. Renal function stable here and pt is not vol overloaded or uremic.  OK for dc, our office will contact patient to set up f/u visit.  Will sign off.  2. Gross hematuria - likely ruptured renal cyst vs ureteral stone, however no sign of obstruction and hematuria seems to be improving. No indication for acute intervention. Getting abx for poss UTI.  Per primary.  3. HTN -  BP's under reasonable control now on norvasc/ hydralazine. No diuretic needed at this time.      Rob Mikka Kissner 09/19/2019, 1:06 PM   Recent Labs  Lab 09/18/19 0508 09/19/19 0435  K 4.4  4.3 4.2  BUN 38*  39* 38*  CREATININE 4.59*  4.53* 4.18*  CALCIUM 8.5*  8.5* 8.6*  PHOS 4.2 4.1  HGB 11.9* 12.0*   Inpatient medications: . amLODipine  10 mg Oral Daily  . atorvastatin  20 mg Oral Daily  . hydrALAZINE  25 mg Oral Q8H  . sodium chloride flush  3 mL Intravenous Q12H   . cefTRIAXone (ROCEPHIN)  IV 1 g (09/18/19 2059)   acetaminophen **OR** acetaminophen, albuterol, HYDROmorphone (DILAUDID) injection, labetalol, ondansetron **OR** ondansetron (ZOFRAN) IV, oxyCODONE, senna-docusate, sorbitol

## 2019-09-23 LAB — CULTURE, BLOOD (ROUTINE X 2)
Culture: NO GROWTH
Culture: NO GROWTH
Special Requests: ADEQUATE
Special Requests: ADEQUATE

## 2019-09-29 ENCOUNTER — Other Ambulatory Visit: Payer: Self-pay

## 2019-09-29 ENCOUNTER — Ambulatory Visit (INDEPENDENT_AMBULATORY_CARE_PROVIDER_SITE_OTHER): Payer: Self-pay | Admitting: Family Medicine

## 2019-09-29 ENCOUNTER — Encounter: Payer: Self-pay | Admitting: Family Medicine

## 2019-09-29 VITALS — BP 139/76 | HR 82 | Temp 98.5°F | Ht 69.0 in | Wt 315.0 lb

## 2019-09-29 DIAGNOSIS — I10 Essential (primary) hypertension: Secondary | ICD-10-CM

## 2019-09-29 DIAGNOSIS — Q613 Polycystic kidney, unspecified: Secondary | ICD-10-CM

## 2019-09-29 DIAGNOSIS — Z09 Encounter for follow-up examination after completed treatment for conditions other than malignant neoplasm: Secondary | ICD-10-CM

## 2019-09-29 DIAGNOSIS — R8290 Unspecified abnormal findings in urine: Secondary | ICD-10-CM

## 2019-09-29 DIAGNOSIS — Z0000 Encounter for general adult medical examination without abnormal findings: Secondary | ICD-10-CM

## 2019-09-29 DIAGNOSIS — Z7689 Persons encountering health services in other specified circumstances: Secondary | ICD-10-CM

## 2019-09-29 DIAGNOSIS — R310 Gross hematuria: Secondary | ICD-10-CM

## 2019-09-29 DIAGNOSIS — K922 Gastrointestinal hemorrhage, unspecified: Secondary | ICD-10-CM

## 2019-09-29 DIAGNOSIS — N10 Acute tubulo-interstitial nephritis: Secondary | ICD-10-CM

## 2019-09-29 DIAGNOSIS — R7309 Other abnormal glucose: Secondary | ICD-10-CM

## 2019-09-29 DIAGNOSIS — R7303 Prediabetes: Secondary | ICD-10-CM

## 2019-09-29 DIAGNOSIS — N179 Acute kidney failure, unspecified: Secondary | ICD-10-CM

## 2019-09-29 LAB — POCT URINALYSIS DIPSTICK
Bilirubin, UA: NEGATIVE
Glucose, UA: NEGATIVE
Ketones, UA: NEGATIVE
Nitrite, UA: NEGATIVE
Protein, UA: POSITIVE — AB
Spec Grav, UA: 1.015 (ref 1.010–1.025)
Urobilinogen, UA: 0.2 E.U./dL
pH, UA: 6 (ref 5.0–8.0)

## 2019-09-29 LAB — POCT GLYCOSYLATED HEMOGLOBIN (HGB A1C): Hemoglobin A1C: 5.8 % — AB (ref 4.0–5.6)

## 2019-09-29 LAB — GLUCOSE, POCT (MANUAL RESULT ENTRY): POC Glucose: 104 mg/dl — AB (ref 70–99)

## 2019-09-29 NOTE — Progress Notes (Signed)
Patient [facility]   New Patient--Hospital Follow Up--Establish Care   Subjective:  Patient ID: Kevin Todd, male    DOB: May 07, 1976  Age: 44 y.o. MRN: [id number]  CC:  Chief Complaint  Patient presents with  . Hospitalization Follow-up    ED 09/17/2019 LLQ abd pain   . Follow-up    HPI Kevin Todd is a 44 year old male who presents for Follow Up today.   Past Medical History:  Diagnosis Date  . Hypertension   . Pneumothorax on right    Current Status: Since his last office visit, he has had a Hospital Admission from 09/17/2019-09/19/2019 visits for Abdominal Pain. Denies GI problems such as nausea, vomiting, diarrhea, and constipation. He has no reports of blood in stools, dysuria and hematuria. Today, He is doing well with no complaints. He denies visual changes, chest pain, cough, shortness of breath, heart palpitations, and falls. He has occasional headaches and dizziness with position changes. Denies severe headaches, confusion, seizures, double vision, and blurred vision, nausea and vomiting. He denies fevers, chills, fatigue, recent infections, weight loss, and night sweats.  No depression or anxiety reported. He denies suicidal ideations, homicidal ideations, or auditory hallucinations. He is taking all medications as prescribed. He denies pain today.   History reviewed. No pertinent surgical history.  Family History  Problem Relation Age of Onset  . Hypertension Mother     Social History   Socioeconomic History  . Marital status: Single    Spouse name: Not on file  . Number of children: Not on file  . Years of education: Not on file  . Highest education level: Not on file  Occupational History  . Not on file  Tobacco Use  . Smoking status: Current Every Day Smoker  . Smokeless tobacco: Never Used  Substance and Sexual Activity  . Alcohol use: Yes  . Drug use: No  . Sexual activity: Yes  Other Topics Concern  . Not on  file  Social History Narrative  . Not on file   Social Determinants of Health   Financial Resource Strain:   . Difficulty of Paying Living Expenses:   Food Insecurity:   . Worried About Charity fundraiser in the Last Year:   . Arboriculturist in the Last Year:   Transportation Needs:   . Film/video editor (Medical):   Marland Kitchen Lack of Transportation (Non-Medical):   Physical Activity:   . Days of Exercise per Week:   . Minutes of Exercise per Session:   Stress:   . Feeling of Stress :   Social Connections:   . Frequency of Communication with Friends and Family:   . Frequency of Social Gatherings with Friends and Family:   . Attends Religious Services:   . Active Member of Clubs or Organizations:   . Attends Archivist Meetings:   Marland Kitchen Marital Status:   Intimate Partner Violence:   . Fear of Current or Ex-Partner:   . Emotionally Abused:   Marland Kitchen Physically Abused:   . Sexually Abused:     Outpatient Medications Prior to Visit  Medication Sig Dispense Refill  . acetaminophen (TYLENOL) 325 MG tablet Take 2 tablets (650 mg total) by mouth every 6 (six) hours as needed for mild pain (or Fever >/= 101). 20 tablet 0  . amLODipine (NORVASC) 10 MG tablet Take 1 tablet (10 mg total) by mouth daily. 30 tablet 1  . atorvastatin (LIPITOR) 20  MG tablet Take 1 tablet (20 mg total) by mouth daily. 30 tablet 1  . hydrALAZINE (APRESOLINE) 25 MG tablet Take 1 tablet (25 mg total) by mouth every 8 (eight) hours. 90 tablet 0  . nicotine (NICODERM CQ - DOSED IN MG/24 HOURS) 21 mg/24hr patch Place 1 patch (21 mg total) onto the skin daily. 28 patch 0  . ondansetron (ZOFRAN) 4 MG tablet Take 1 tablet (4 mg total) by mouth every 6 (six) hours as needed for nausea. 20 tablet 0  . oxyCODONE (OXY IR/ROXICODONE) 5 MG immediate release tablet Take 1 tablet (5 mg total) by mouth every 4 (four) hours as needed for moderate pain. 10 tablet 0  . senna-docusate (SENOKOT-S) 8.6-50 MG tablet Take 1 tablet by  mouth at bedtime as needed for mild constipation. 30 tablet 0   No facility-administered medications prior to visit.    No Known Allergies  ROS Review of Systems  Constitutional: Negative.   HENT: Negative.   Eyes: Negative.   Respiratory: Negative.   Cardiovascular: Negative.   Gastrointestinal: Positive for abdominal distention.  Endocrine: Negative.   Genitourinary: Negative.   Musculoskeletal: Positive for arthralgias (generalized joint pain).  Skin: Negative.   Allergic/Immunologic: Negative.   Neurological: Positive for dizziness (occasional ) and headaches (occasional ).  Hematological: Negative.   Psychiatric/Behavioral: Negative.       Objective:    Physical Exam  Constitutional: He is oriented to person, place, and time. He appears well-developed and well-nourished.  HENT:  Head: Normocephalic and atraumatic.  Eyes: Conjunctivae are normal.  Cardiovascular: Normal rate, regular rhythm, normal heart sounds and intact distal pulses.  Pulmonary/Chest: Effort normal and breath sounds normal.  Abdominal: Soft. Bowel sounds are normal. He exhibits distension (obese).  Musculoskeletal:        General: Normal range of motion.     Cervical back: Normal range of motion and neck supple.  Neurological: He is alert and oriented to person, place, and time. He has normal reflexes.  Skin: Skin is warm and dry.  Psychiatric: He has a normal mood and affect. His behavior is normal. Judgment and thought content normal.  Nursing note and vitals reviewed.   BP 139/76   Pulse 82   Temp 98.5 F (36.9 C)   Ht 5\' 9"  (1.753 m)   Wt (!) 315 lb (142.9 kg)   SpO2 99%   BMI 46.52 kg/m  Wt Readings from Last 3 Encounters:  09/29/19 (!) 315 lb (142.9 kg)  09/19/19 (!) 301 lb 5.9 oz (136.7 kg)  09/15/19 (!) 320 lb (145.2 kg)     Health Maintenance Due  Topic Date Due  . COVID-19 Vaccine (1) Never done  . TETANUS/TDAP  Never done    There are no preventive care reminders to  display for this patient.  Lab Results  Component Value Date   TSH 1.950 02/29/2016   Lab Results  Component Value Date   WBC 7.5 09/19/2019   HGB 12.0 (L) 09/19/2019   HCT 37.1 (L) 09/19/2019   MCV 93.7 09/19/2019   PLT 307 09/19/2019   Lab Results  Component Value Date   NA 138 09/19/2019   K 4.2 09/19/2019   CO2 21 (L) 09/19/2019   GLUCOSE 123 (H) 09/19/2019   BUN 38 (H) 09/19/2019   CREATININE 4.18 (H) 09/19/2019   BILITOT 0.5 09/19/2019   ALKPHOS 58 09/19/2019   AST 17 09/19/2019   ALT 22 09/19/2019   PROT 6.6 09/19/2019   ALBUMIN 3.1 (L)  09/19/2019   CALCIUM 8.6 (L) 09/19/2019   ANIONGAP 10 09/19/2019   Lab Results  Component Value Date   CHOL 190 09/18/2019   Lab Results  Component Value Date   HDL 39 (L) 09/18/2019   Lab Results  Component Value Date   LDLCALC 126 (H) 09/18/2019   Lab Results  Component Value Date   TRIG 124 09/18/2019   Lab Results  Component Value Date   CHOLHDL 4.9 09/18/2019   Lab Results  Component Value Date   HGBA1C 5.8 (A) 09/29/2019      Assessment & Plan:   1. Hospital discharge follow-up  2. Encounter to establish care  3. Acute renal failure, unspecified acute renal failure type [facility]) - Ambulatory referral to Nephrology - Ambulatory referral to Urology  4. Acute pyelonephritis - Ambulatory referral to Nephrology - Ambulatory referral to Urology  5. Essential hypertension The current medical regimen is effective; blood pressure is stable at 139/76 today; continue present plan and medications as prescribed. He will continue to take medications as prescribed, to decrease high sodium intake, excessive alcohol intake, increase potassium intake, smoking cessation, and increase physical activity of at least 30 minutes of cardio activity daily. He will continue to follow Heart Healthy or DASH diet.  6. Polycystic kidney disease - Ambulatory referral to Nephrology - Ambulatory referral to Urology  7.  Gastrointestinal hemorrhage, unspecified gastrointestinal hemorrhage type - Ambulatory referral to Gastroenterology  8. Acute GI bleeding  9. Gross hematuria - Ambulatory referral to Gastroenterology  10. Prediabetes  11. Hemoglobin A1c less than 7.0% Hgb A1c is stable at 5.8. Monitor.  12. Abnormal urinalysis Results are pending. - Urine Culture  13. Health care maintenance - POCT urinalysis dipstick - POCT glycosylated hemoglobin (Hb A1C) - POCT glucose (manual entry) - CBC with Differential - Comprehensive metabolic panel - TSH - Vitamin B12 - Vitamin D, 25-hydroxy - Iron and TIBC([facility])  14. Follow up He will follow up in 1 month.  No orders of the defined types were placed in this encounter.   Orders Placed This Encounter  Procedures  . Urine Culture  . CBC with Differential  . Comprehensive metabolic panel  . TSH  . Vitamin B12  . Vitamin D, 25-hydroxy  . Iron and TIBC([facility])  . Ambulatory referral to Nephrology  . Ambulatory referral to Urology  . Ambulatory referral to Gastroenterology  . POCT urinalysis dipstick  . POCT glycosylated hemoglobin (Hb A1C)  . POCT glucose (manual entry)     Referral Orders     Ambulatory referral to Nephrology     Ambulatory referral to Urology     Ambulatory referral to Gastroenterology   Kathe Becton,  MSN, FNP-BC [facility] [address] [place], [place] [address] [phone] fax   Problem List Items Addressed This Visit      Cardiovascular and Mediastinum   Essential hypertension     Genitourinary   Acute pyelonephritis   Relevant Orders   Ambulatory referral to Nephrology   Ambulatory referral to Urology   ARF (acute renal failure) [facility])   Relevant Orders   Ambulatory referral to Nephrology   Ambulatory referral to Urology   Gross hematuria   Relevant Orders   Ambulatory referral to  Gastroenterology   Polycystic kidney disease   Relevant Orders   Ambulatory referral to Nephrology   Ambulatory referral to Urology    Other Visit Diagnoses    Hospital discharge follow-up    -  Primary   Encounter to establish care       Gastrointestinal hemorrhage, unspecified gastrointestinal hemorrhage type       Relevant Orders   Ambulatory referral to Gastroenterology   Acute GI bleeding       Prediabetes       Hemoglobin A1c less than 7.0%       Abnormal urinalysis       Relevant Orders   Urine Culture   Health care maintenance       Relevant Orders   POCT urinalysis dipstick (Completed)   POCT glycosylated hemoglobin (Hb A1C) (Completed)   POCT glucose (manual entry) (Completed)   CBC with Differential   Comprehensive metabolic panel   TSH   Vitamin B12   Vitamin D, 25-hydroxy   Iron and TIBC([facility])   Follow up          No orders of the defined types were placed in this encounter.   Follow-up: Return in about 1 month (around 10/30/2019).    Azzie Glatter, FNP

## 2019-09-30 LAB — IRON AND TIBC
Iron Saturation: 29 % (ref 15–55)
Iron: 71 ug/dL (ref 38–169)
Total Iron Binding Capacity: 247 ug/dL — ABNORMAL LOW (ref 250–450)
UIBC: 176 ug/dL (ref 111–343)

## 2019-09-30 LAB — CBC WITH DIFFERENTIAL/PLATELET
Basophils Absolute: 0.1 10*3/uL (ref 0.0–0.2)
Basos: 1 %
EOS (ABSOLUTE): 0.2 10*3/uL (ref 0.0–0.4)
Eos: 2 %
Hematocrit: 33.3 % — ABNORMAL LOW (ref 37.5–51.0)
Hemoglobin: 11.5 g/dL — ABNORMAL LOW (ref 13.0–17.7)
Immature Grans (Abs): 0 10*3/uL (ref 0.0–0.1)
Immature Granulocytes: 0 %
Lymphocytes Absolute: 1.6 10*3/uL (ref 0.7–3.1)
Lymphs: 18 %
MCH: 30.5 pg (ref 26.6–33.0)
MCHC: 34.5 g/dL (ref 31.5–35.7)
MCV: 88 fL (ref 79–97)
Monocytes Absolute: 0.5 10*3/uL (ref 0.1–0.9)
Monocytes: 5 %
Neutrophils Absolute: 6.7 10*3/uL (ref 1.4–7.0)
Neutrophils: 74 %
Platelets: 349 10*3/uL (ref 150–450)
RBC: 3.77 x10E6/uL — ABNORMAL LOW (ref 4.14–5.80)
RDW: 13.3 % (ref 11.6–15.4)
WBC: 9 10*3/uL (ref 3.4–10.8)

## 2019-09-30 LAB — COMPREHENSIVE METABOLIC PANEL
ALT: 20 IU/L (ref 0–44)
AST: 15 IU/L (ref 0–40)
Albumin/Globulin Ratio: 1.9 (ref 1.2–2.2)
Albumin: 4 g/dL (ref 4.0–5.0)
Alkaline Phosphatase: 81 IU/L (ref 48–121)
BUN/Creatinine Ratio: 11 (ref 9–20)
BUN: 33 mg/dL — ABNORMAL HIGH (ref 6–24)
Bilirubin Total: 0.3 mg/dL (ref 0.0–1.2)
CO2: 17 mmol/L — ABNORMAL LOW (ref 20–29)
Calcium: 9.3 mg/dL (ref 8.7–10.2)
Chloride: 112 mmol/L — ABNORMAL HIGH (ref 96–106)
Creatinine, Ser: 2.93 mg/dL — ABNORMAL HIGH (ref 0.76–1.27)
GFR calc Af Amer: 29 mL/min/{1.73_m2} — ABNORMAL LOW (ref >59)
GFR calc non Af Amer: 25 mL/min/{1.73_m2} — ABNORMAL LOW (ref >59)
Globulin, Total: 2.1 g/dL (ref 1.5–4.5)
Glucose: 87 mg/dL (ref 65–99)
Potassium: 4.7 mmol/L (ref 3.5–5.2)
Sodium: 142 mmol/L (ref 134–144)
Total Protein: 6.1 g/dL (ref 6.0–8.5)

## 2019-09-30 LAB — TSH: TSH: 1.89 u[IU]/mL (ref 0.450–4.500)

## 2019-09-30 LAB — VITAMIN D 25 HYDROXY (VIT D DEFICIENCY, FRACTURES): Vit D, 25-Hydroxy: 5.9 ng/mL — ABNORMAL LOW (ref 30.0–100.0)

## 2019-09-30 LAB — VITAMIN B12: Vitamin B-12: 388 pg/mL (ref 232–1245)

## 2019-10-01 ENCOUNTER — Encounter: Payer: Self-pay | Admitting: Family Medicine

## 2019-10-01 ENCOUNTER — Other Ambulatory Visit: Payer: Self-pay | Admitting: Family Medicine

## 2019-10-01 DIAGNOSIS — K625 Hemorrhage of anus and rectum: Secondary | ICD-10-CM

## 2019-10-01 DIAGNOSIS — R1032 Left lower quadrant pain: Secondary | ICD-10-CM

## 2019-10-01 DIAGNOSIS — E559 Vitamin D deficiency, unspecified: Secondary | ICD-10-CM

## 2019-10-01 DIAGNOSIS — N179 Acute kidney failure, unspecified: Secondary | ICD-10-CM

## 2019-10-01 LAB — URINE CULTURE: Organism ID, Bacteria: NO GROWTH

## 2019-10-01 MED ORDER — VITAMIN D (ERGOCALCIFEROL) 1.25 MG (50000 UNIT) PO CAPS: 50000.0000 [IU] | ORAL_CAPSULE | ORAL | 6 refills | Status: DC

## 2019-10-14 DIAGNOSIS — G4700 Insomnia, unspecified: Secondary | ICD-10-CM

## 2019-10-14 HISTORY — DX: Insomnia, unspecified: G47.00

## 2019-10-20 ENCOUNTER — Inpatient Hospital Stay: Payer: Self-pay | Admitting: Family Medicine

## 2019-10-21 ENCOUNTER — Encounter: Payer: Self-pay | Admitting: Gastroenterology

## 2019-10-31 ENCOUNTER — Other Ambulatory Visit: Payer: Self-pay

## 2019-10-31 ENCOUNTER — Ambulatory Visit (INDEPENDENT_AMBULATORY_CARE_PROVIDER_SITE_OTHER): Payer: Self-pay | Admitting: Family Medicine

## 2019-10-31 ENCOUNTER — Encounter: Payer: Self-pay | Admitting: Family Medicine

## 2019-10-31 VITALS — BP 134/78 | HR 54 | Temp 97.8°F | Wt 303.0 lb

## 2019-10-31 DIAGNOSIS — Z09 Encounter for follow-up examination after completed treatment for conditions other than malignant neoplasm: Secondary | ICD-10-CM

## 2019-10-31 DIAGNOSIS — M25559 Pain in unspecified hip: Secondary | ICD-10-CM

## 2019-10-31 DIAGNOSIS — Q613 Polycystic kidney, unspecified: Secondary | ICD-10-CM

## 2019-10-31 DIAGNOSIS — G8929 Other chronic pain: Secondary | ICD-10-CM

## 2019-10-31 DIAGNOSIS — I10 Essential (primary) hypertension: Secondary | ICD-10-CM

## 2019-10-31 DIAGNOSIS — G4700 Insomnia, unspecified: Secondary | ICD-10-CM

## 2019-10-31 MED ORDER — TRAZODONE HCL 100 MG PO TABS: 100.0000 mg | ORAL_TABLET | Freq: Every evening | ORAL | 6 refills | Status: DC | PRN

## 2019-10-31 MED ORDER — ACETAMINOPHEN 500 MG PO TABS: 1000.0000 mg | ORAL_TABLET | Freq: Two times a day (BID) | ORAL | 6 refills | Status: DC

## 2019-10-31 MED ORDER — OXYCODONE HCL 5 MG PO TABS: 5.0000 mg | ORAL_TABLET | Freq: Three times a day (TID) | ORAL | 0 refills | Status: DC | PRN

## 2019-10-31 NOTE — Patient Instructions (Signed)
Trazodone tablets What is this medicine? TRAZODONE (TRAZ oh done) is used to treat depression. This medicine may be used for other purposes; ask your health care provider or pharmacist if you have questions. COMMON BRAND NAME(S): Desyrel What should I tell my health care provider before I take this medicine? They need to know if you have any of these conditions:  attempted suicide or thinking about it  bipolar disorder  bleeding problems  glaucoma  heart disease, or previous heart attack  irregular heart beat  kidney or liver disease  low levels of sodium in the blood  an unusual or allergic reaction to trazodone, other medicines, foods, dyes or preservatives  pregnant or trying to get pregnant  breast-feeding How should I use this medicine? Take this medicine by mouth with a glass of water. Follow the directions on the prescription label. Take this medicine shortly after a meal or a light snack. Take your medicine at regular intervals. Do not take your medicine more often than directed. Do not stop taking this medicine suddenly except upon the advice of your doctor. Stopping this medicine too quickly may cause serious side effects or your condition may worsen. A special MedGuide will be given to you by the pharmacist with each prescription and refill. Be sure to read this information carefully each time. Talk to your pediatrician regarding the use of this medicine in children. Special care may be needed. Overdosage: If you think you have taken too much of this medicine contact a poison control center or emergency room at once. NOTE: This medicine is only for you. Do not share this medicine with others. What if I miss a dose? If you miss a dose, take it as soon as you can. If it is almost time for your next dose, take only that dose. Do not take double or extra doses. What may interact with this medicine? Do not take this medicine with any of the following  medications:  certain medicines for fungal infections like fluconazole, itraconazole, ketoconazole, posaconazole, voriconazole  cisapride  dronedarone  linezolid  MAOIs like Carbex, Eldepryl, Marplan, Nardil, and Parnate  mesoridazine  methylene blue (injected into a vein)  pimozide  saquinavir  thioridazine This medicine may also interact with the following medications:  alcohol  antiviral medicines for HIV or AIDS  aspirin and aspirin-like medicines  barbiturates like phenobarbital  certain medicines for blood pressure, heart disease, irregular heart beat  certain medicines for depression, anxiety, or psychotic disturbances  certain medicines for migraine headache like almotriptan, eletriptan, frovatriptan, naratriptan, rizatriptan, sumatriptan, zolmitriptan  certain medicines for seizures like carbamazepine and phenytoin  certain medicines for sleep  certain medicines that treat or prevent blood clots like dalteparin, enoxaparin, warfarin  digoxin  fentanyl  lithium  NSAIDS, medicines for pain and inflammation, like ibuprofen or naproxen  other medicines that prolong the QT interval (cause an abnormal heart rhythm) like dofetilide  rasagiline  supplements like St. John's wort, kava kava, valerian  tramadol  tryptophan This list may not describe all possible interactions. Give your health care provider a list of all the medicines, herbs, non-prescription drugs, or dietary supplements you use. Also tell them if you smoke, drink alcohol, or use illegal drugs. Some items may interact with your medicine. What should I watch for while using this medicine? Tell your doctor if your symptoms do not get better or if they get worse. Visit your doctor or health care professional for regular checks on your progress. Because it may take   several weeks to see the full effects of this medicine, it is important to continue your treatment as prescribed by your  doctor. Patients and their families should watch out for new or worsening thoughts of suicide or depression. Also watch out for sudden changes in feelings such as feeling anxious, agitated, panicky, irritable, hostile, aggressive, impulsive, severely restless, overly excited and hyperactive, or not being able to sleep. If this happens, especially at the beginning of treatment or after a change in dose, call your health care professional. Dennis Bast may get drowsy or dizzy. Do not drive, use machinery, or do anything that needs mental alertness until you know how this medicine affects you. Do not stand or sit up quickly, especially if you are an older patient. This reduces the risk of dizzy or fainting spells. Alcohol may interfere with the effect of this medicine. Avoid alcoholic drinks. This medicine may cause dry eyes and blurred vision. If you wear contact lenses you may feel some discomfort. Lubricating drops may help. See your eye doctor if the problem does not go away or is severe. Your mouth may get dry. Chewing sugarless gum, sucking hard candy and drinking plenty of water may help. Contact your doctor if the problem does not go away or is severe. What side effects may I notice from receiving this medicine? Side effects that you should report to your doctor or health care professional as soon as possible:  allergic reactions like skin rash, itching or hives, swelling of the face, lips, or tongue  elevated mood, decreased need for sleep, racing thoughts, impulsive behavior  confusion  fast, irregular heartbeat  feeling faint or lightheaded, falls  feeling agitated, angry, or irritable  loss of balance or coordination  painful or prolonged erections  restlessness, pacing, inability to keep still  suicidal thoughts or other mood changes  tremors  trouble sleeping  seizures  unusual bleeding or bruising Side effects that usually do not require medical attention (report to your doctor  or health care professional if they continue or are bothersome):  change in sex drive or performance  change in appetite or weight  constipation  headache  muscle aches or pains  nausea This list may not describe all possible side effects. Call your doctor for medical advice about side effects. You may report side effects to FDA at 1-800-FDA-1088. Where should I keep my medicine? Keep out of the reach of children. Store at room temperature between 15 and 30 degrees C (59 to 86 degrees F). Protect from light. Keep container tightly closed. Throw away any unused medicine after the expiration date. NOTE: This sheet is a summary. It may not cover all possible information. If you have questions about this medicine, talk to your doctor, pharmacist, or health care provider.  2020 Elsevier/Gold Standard (2018-04-23 11:46:46) Acetaminophen tablets or caplets What is this medicine? ACETAMINOPHEN (a set a MEE noe fen) is a pain reliever. It is used to treat mild pain and fever. This medicine may be used for other purposes; ask your health care provider or pharmacist if you have questions. COMMON BRAND NAME(S): Aceta, Actamin, Anacin Aspirin Free, Genapap, Genebs, Mapap, Pain & Fever, Pain and Fever, PAIN RELIEF, PAIN RELIEF Extra Strength, Pain Reliever, Panadol, PHARBETOL, Q-Pap, Q-Pap Extra Strength, Tylenol, Tylenol CrushableTablet, Tylenol Extra Strength, XS No Aspirin, XS Pain Reliever What should I tell my health care provider before I take this medicine? They need to know if you have any of these conditions:  if you often drink  alcohol  liver disease  an unusual or allergic reaction to acetaminophen, other medicines, foods, dyes, or preservatives  pregnant or trying to get pregnant  breast-feeding How should I use this medicine? Take this medicine by mouth with a glass of water. Follow the directions on the package or prescription label. Take your medicine at regular intervals. Do  not take your medicine more often than directed. Talk to your pediatrician regarding the use of this medicine in children. While this drug may be prescribed for children as young as 81 years of age for selected conditions, precautions do apply. Overdosage: If you think you have taken too much of this medicine contact a poison control center or emergency room at once. NOTE: This medicine is only for you. Do not share this medicine with others. What if I miss a dose? If you miss a dose, take it as soon as you can. If it is almost time for your next dose, take only that dose. Do not take double or extra doses. What may interact with this medicine?  alcohol  imatinib  isoniazid  other medicines with acetaminophen This list may not describe all possible interactions. Give your health care provider a list of all the medicines, herbs, non-prescription drugs, or dietary supplements you use. Also tell them if you smoke, drink alcohol, or use illegal drugs. Some items may interact with your medicine. What should I watch for while using this medicine? Tell your doctor or health care professional if the pain lasts more than 10 days (5 days for children), if it gets worse, or if there is a new or different kind of pain. Also, check with your doctor if a fever lasts for more than 3 days. Do not take other medicines that contain acetaminophen with this medicine. Always read labels carefully. If you have questions, ask your doctor or pharmacist. If you take too much acetaminophen get medical help right away. Too much acetaminophen can be very dangerous and cause liver damage. Even if you do not have symptoms, it is important to get help right away. What side effects may I notice from receiving this medicine? Side effects that you should report to your doctor or health care professional as soon as possible:  allergic reactions like skin rash, itching or hives, swelling of the face, lips, or tongue  breathing  problems  fever or sore throat  redness, blistering, peeling or loosening of the skin, including inside the mouth  trouble passing urine or change in the amount of urine  unusual bleeding or bruising  unusually weak or tired  yellowing of the eyes or skin Side effects that usually do not require medical attention (report to your doctor or health care professional if they continue or are bothersome):  headache  nausea, stomach upset This list may not describe all possible side effects. Call your doctor for medical advice about side effects. You may report side effects to FDA at 1-800-FDA-1088. Where should I keep my medicine? Keep out of reach of children. Store at room temperature between 20 and 25 degrees C (68 and 77 degrees F). Protect from moisture and heat. Throw away any unused medicine after the expiration date. NOTE: This sheet is a summary. It may not cover all possible information. If you have questions about this medicine, talk to your doctor, pharmacist, or health care provider.  2020 Elsevier/Gold Standard (2012-12-23 12:54:16)

## 2019-10-31 NOTE — Progress Notes (Signed)
Patient [facility]   Established Patient Office Visit  Subjective:  Patient ID: Kevin Todd, male    DOB: [birth year]  Age: 44 y.o. MRN: [id number]  CC:  Chief Complaint  Patient presents with  . Follow-up    1 month follow up, still having abdominal pain; medication refills    HPI Kevin Todd is a 44 year old male who ipresents for Follow Up today.    Patient Active Problem List   Diagnosis Date Noted  . AKI (acute kidney injury) ([place])   . LLQ abdominal pain   . Rectal bleeding   . ARF (acute renal failure) ([place]) 09/17/2019  . Hypertensive urgency 09/17/2019  . Gross hematuria 09/17/2019  . Acute pyelonephritis 09/17/2019  . Complicated UTI (urinary tract infection) 09/17/2019  . Polycystic kidney disease 09/17/2019  . Essential hypertension   . Chronic kidney disease   . Chest pain 02/28/2016  . Acute kidney injury ([place]) 02/28/2016  . Morbid obesity ([place]) 02/28/2016  . Tobacco abuse 02/28/2016  . Hypertensive emergency 02/28/2016  . Spontaneous pneumothorax 01/24/2013     Past Medical History:  Diagnosis Date  . Elevated creatine kinase 09/2019  . Hypertension   . Insomnia 10/2019  . Pneumothorax on right   . Vitamin D deficiency 09/2019   Current Status: Since his last office visit, he is doing well with no complaints. He continues to c/o increased fatigue. He continues to follow up with Nephrology as needed.  He denies visual changes, chest pain, cough, shortness of breath, heart palpitations, and falls. He has occasional headaches and dizziness with position changes. Denies severe headaches, confusion, seizures, double vision, and blurred vision, nausea and vomiting. He denies fevers, chills, fatigue, recent infections, weight loss, and night sweats. Denies GI problems such as diarrhea, and constipation. He has no reports of blood in stools, dysuria and hematuria. No depression or anxiety, and denies suicidal  ideations, homicidal ideations, or auditory hallucinations. He is taking all medications as prescribed. He denies pain today.   History reviewed. No pertinent surgical history.  Family History  Problem Relation Age of Onset  . Hypertension Mother     Social History   Socioeconomic History  . Marital status: Single    Spouse name: Not on file  . Number of children: Not on file  . Years of education: Not on file  . Highest education level: Not on file  Occupational History  . Not on file  Tobacco Use  . Smoking status: Current Every Day Smoker    Packs/day: 0.25    Types: Cigarettes  . Smokeless tobacco: Never Used  . Tobacco comment: 4 cigarettes/pd  Vaping Use  . Vaping Use: Never used  Substance and Sexual Activity  . Alcohol use: Yes  . Drug use: No  . Sexual activity: Yes  Other Topics Concern  . Not on file  Social History Narrative  . Not on file   Social Determinants of Health   Financial Resource Strain:   . Difficulty of Paying Living Expenses:   Food Insecurity:   . Worried About Charity fundraiser in the Last Year:   . Arboriculturist in the Last Year:   Transportation Needs:   . Film/video editor (Medical):   Marland Kitchen Lack of Transportation (Non-Medical):   Physical Activity:   . Days of Exercise per Week:   . Minutes of Exercise per Session:   Stress:   . Feeling of  Stress :   Social Connections:   . Frequency of Communication with Friends and Family:   . Frequency of Social Gatherings with Friends and Family:   . Attends Religious Services:   . Active Member of Clubs or Organizations:   . Attends Archivist Meetings:   Marland Kitchen Marital Status:   Intimate Partner Violence:   . Fear of Current or Ex-Partner:   . Emotionally Abused:   Marland Kitchen Physically Abused:   . Sexually Abused:     Outpatient Medications Prior to Visit  Medication Sig Dispense Refill  . amLODipine (NORVASC) 10 MG tablet Take 1 tablet (10 mg total) by mouth daily. 30 tablet  1  . atorvastatin (LIPITOR) 20 MG tablet Take 1 tablet (20 mg total) by mouth daily. 30 tablet 1  . Vitamin D, Ergocalciferol, (DRISDOL) 1.25 MG (50000 UNIT) CAPS capsule Take 1 capsule (50,000 Units total) by mouth every 7 (seven) days. 5 capsule 6  . acetaminophen (TYLENOL) 325 MG tablet Take 2 tablets (650 mg total) by mouth every 6 (six) hours as needed for mild pain (or Fever >/= 101). 20 tablet 0  . hydrALAZINE (APRESOLINE) 25 MG tablet Take 1 tablet (25 mg total) by mouth every 8 (eight) hours. (Patient not taking: Reported on 10/31/2019) 90 tablet 0  . nicotine (NICODERM CQ - DOSED IN MG/24 HOURS) 21 mg/24hr patch Place 1 patch (21 mg total) onto the skin daily. (Patient not taking: Reported on 10/31/2019) 28 patch 0  . ondansetron (ZOFRAN) 4 MG tablet Take 1 tablet (4 mg total) by mouth every 6 (six) hours as needed for nausea. (Patient not taking: Reported on 10/31/2019) 20 tablet 0  . senna-docusate (SENOKOT-S) 8.6-50 MG tablet Take 1 tablet by mouth at bedtime as needed for mild constipation. (Patient not taking: Reported on 10/31/2019) 30 tablet 0  . oxyCODONE (OXY IR/ROXICODONE) 5 MG immediate release tablet Take 1 tablet (5 mg total) by mouth every 4 (four) hours as needed for moderate pain. (Patient not taking: Reported on 10/31/2019) 10 tablet 0   No facility-administered medications prior to visit.    No Known Allergies  ROS Review of Systems  Constitutional: Negative.   HENT: Negative.   Eyes: Negative.   Respiratory: Negative.   Cardiovascular: Negative.   Gastrointestinal: Positive for abdominal distention.  Endocrine: Negative.   Genitourinary: Negative.   Musculoskeletal: Positive for arthralgias (generalized joint pain) and back pain (chronic).       Left hip pain  Skin: Negative.   Allergic/Immunologic: Negative.   Neurological: Positive for dizziness (occassional ).  Hematological: Negative.   Psychiatric/Behavioral: Negative.    Objective:    Physical  Exam Vitals and nursing note reviewed.  Constitutional:      Appearance: Normal appearance.  HENT:     Head: Normocephalic and atraumatic.     Nose: Nose normal.     Mouth/Throat:     Mouth: Mucous membranes are moist.  Cardiovascular:     Rate and Rhythm: Normal rate and regular rhythm.     Pulses: Normal pulses.     Heart sounds: Normal heart sounds.  Pulmonary:     Effort: Pulmonary effort is normal.     Breath sounds: Normal breath sounds.  Abdominal:     General: Bowel sounds are normal. There is distension (obese).  Musculoskeletal:        General: Normal range of motion.     Cervical back: Normal range of motion and neck supple.  Skin:    General:  Skin is warm and dry.  Neurological:     Mental Status: He is alert.  Psychiatric:        Mood and Affect: Mood normal.        Behavior: Behavior normal.        Thought Content: Thought content normal.        Judgment: Judgment normal.    BP 134/78 (BP Location: Left Arm, Patient Position: Sitting, Cuff Size: Large)   Pulse (!) 54   Temp 97.8 F (36.6 C)   Wt (!) 303 lb 0.6 oz (137.5 kg)   SpO2 98%   BMI 44.75 kg/m  Wt Readings from Last 3 Encounters:  10/31/19 (!) 303 lb 0.6 oz (137.5 kg)  09/29/19 (!) 315 lb (142.9 kg)  09/19/19 (!) 301 lb 5.9 oz (136.7 kg)     Health Maintenance Due  Topic Date Due  . Hepatitis C Screening  Never done  . COVID-19 Vaccine (1) Never done  . TETANUS/TDAP  Never done    There are no preventive care reminders to display for this patient.  Lab Results  Component Value Date   TSH 1.890 09/29/2019   Lab Results  Component Value Date   WBC 9.0 09/29/2019   HGB 11.5 (L) 09/29/2019   HCT 33.3 (L) 09/29/2019   MCV 88 09/29/2019   PLT 349 09/29/2019   Lab Results  Component Value Date   NA 142 09/29/2019   K 4.7 09/29/2019   CO2 17 (L) 09/29/2019   GLUCOSE 87 09/29/2019   BUN 33 (H) 09/29/2019   CREATININE 2.93 (H) 09/29/2019   BILITOT 0.3 09/29/2019   ALKPHOS 81  09/29/2019   AST 15 09/29/2019   ALT 20 09/29/2019   PROT 6.1 09/29/2019   ALBUMIN 4.0 09/29/2019   CALCIUM 9.3 09/29/2019   ANIONGAP 10 09/19/2019   Lab Results  Component Value Date   CHOL 190 09/18/2019   Lab Results  Component Value Date   HDL 39 (L) 09/18/2019   Lab Results  Component Value Date   LDLCALC 126 (H) 09/18/2019   Lab Results  Component Value Date   TRIG 124 09/18/2019   Lab Results  Component Value Date   CHOLHDL 4.9 09/18/2019   Lab Results  Component Value Date   HGBA1C 5.8 (A) 09/29/2019   Assessment & Plan:   1. Chronic hip pain, unspecified laterality - acetaminophen (TYLENOL) 500 MG tablet; Take 2 tablets (1,000 mg total) by mouth in the morning and at bedtime.  Dispense: 120 tablet; Refill: 6 - oxyCODONE (OXY IR/ROXICODONE) 5 MG immediate release tablet; Take 1 tablet (5 mg total) by mouth every 8 (eight) hours as needed for moderate pain.  Dispense: 30 tablet; Refill: 0  2. Essential hypertension The current medical regimen is effective; blood pressure is stable at 134/78 today; continue present plan and medications as prescribed. He will continue to take medications as prescribed, to decrease high sodium intake, excessive alcohol intake, increase potassium intake, smoking cessation, and increase physical activity of at least 30 minutes of cardio activity daily. He will continue to follow Heart Healthy or DASH diet.  3. Polycystic kidney disease  4. Insomnia, unspecified type - traZODone (DESYREL) 100 MG tablet; Take 1 tablet (100 mg total) by mouth at bedtime as needed for sleep.  Dispense: 30 tablet; Refill: 6  5. Follow up He will follow up in 6 months.   Meds ordered this encounter  Medications  . acetaminophen (TYLENOL) 500 MG tablet  Sig: Take 2 tablets (1,000 mg total) by mouth in the morning and at bedtime.    Dispense:  120 tablet    Refill:  6  . oxyCODONE (OXY IR/ROXICODONE) 5 MG immediate release tablet    Sig: Take 1  tablet (5 mg total) by mouth every 8 (eight) hours as needed for moderate pain.    Dispense:  30 tablet    Refill:  0  . traZODone (DESYREL) 100 MG tablet    Sig: Take 1 tablet (100 mg total) by mouth at bedtime as needed for sleep.    Dispense:  30 tablet    Refill:  6   No orders of the defined types were placed in this encounter.   Referral Orders  No referral(s) requested today    Kathe Becton,  MSN, FNP-BC [place], [place] [address] [phone]- fax    Problem List Items Addressed This Visit      Cardiovascular and Mediastinum   Essential hypertension     Genitourinary   Polycystic kidney disease    Other Visit Diagnoses    Chronic hip pain, unspecified laterality    -  Primary   Relevant Medications   acetaminophen (TYLENOL) 500 MG tablet   oxyCODONE (OXY IR/ROXICODONE) 5 MG immediate release tablet   traZODone (DESYREL) 100 MG tablet   Insomnia, unspecified type       Relevant Medications   traZODone (DESYREL) 100 MG tablet   Follow up          Meds ordered this encounter  Medications  . acetaminophen (TYLENOL) 500 MG tablet    Sig: Take 2 tablets (1,000 mg total) by mouth in the morning and at bedtime.    Dispense:  120 tablet    Refill:  6  . oxyCODONE (OXY IR/ROXICODONE) 5 MG immediate release tablet    Sig: Take 1 tablet (5 mg total) by mouth every 8 (eight) hours as needed for moderate pain.    Dispense:  30 tablet    Refill:  0  . traZODone (DESYREL) 100 MG tablet    Sig: Take 1 tablet (100 mg total) by mouth at bedtime as needed for sleep.    Dispense:  30 tablet    Refill:  6    Follow-up: Return in about 6 months (around 05/01/2020).    Azzie Glatter, FNP

## 2019-11-03 DIAGNOSIS — G8929 Other chronic pain: Secondary | ICD-10-CM | POA: Insufficient documentation

## 2019-12-04 ENCOUNTER — Telehealth: Payer: Self-pay | Admitting: *Deleted

## 2019-12-04 NOTE — Telephone Encounter (Signed)
Dr. Loletha Carrow,   Would you please take a look at this pt's chart? He is scheduled for a direct colonoscopy on 12-29-19, but his referral paperwork states he is coming in for GI bleed (referral made in May). I do not note any further problems. I was going to make him an OV- your schedule is very full and so are the extenders as well. The first available spot I have to put him anywhere is with [facility] on 12-31-19- I just wanted to double check and make sure that is ok with you. Just wanted to make sure you didn't want to see him personally or earlier than that. Thanks. I will make this into a phone note; just wanted to explain about the appointments   Kevin Todd      Pt needs an OV prior to colonoscopy- see above.  Spoke with pt and appt made for 8-25-1 at 2:00 pm with [facility].  Ok per [place] to put a new pt into this spot.

## 2019-12-16 ENCOUNTER — Telehealth: Payer: Self-pay | Admitting: Nurse Practitioner

## 2019-12-17 NOTE — Telephone Encounter (Signed)
Done

## 2019-12-29 ENCOUNTER — Encounter: Payer: Self-pay | Admitting: Gastroenterology

## 2020-01-07 ENCOUNTER — Encounter: Payer: Self-pay | Admitting: Nurse Practitioner

## 2020-01-07 ENCOUNTER — Other Ambulatory Visit (INDEPENDENT_AMBULATORY_CARE_PROVIDER_SITE_OTHER): Payer: Self-pay

## 2020-01-07 ENCOUNTER — Ambulatory Visit (INDEPENDENT_AMBULATORY_CARE_PROVIDER_SITE_OTHER): Payer: Self-pay | Admitting: Nurse Practitioner

## 2020-01-07 VITALS — BP 120/86 | HR 60 | Ht 68.0 in | Wt 304.1 lb

## 2020-01-07 DIAGNOSIS — Q613 Polycystic kidney, unspecified: Secondary | ICD-10-CM

## 2020-01-07 DIAGNOSIS — Z800 Family history of malignant neoplasm of digestive organs: Secondary | ICD-10-CM

## 2020-01-07 DIAGNOSIS — K921 Melena: Secondary | ICD-10-CM

## 2020-01-07 DIAGNOSIS — Z01818 Encounter for other preprocedural examination: Secondary | ICD-10-CM

## 2020-01-07 LAB — CBC WITH DIFFERENTIAL/PLATELET
Basophils Absolute: 0 10*3/uL (ref 0.0–0.1)
Basophils Relative: 0.5 % (ref 0.0–3.0)
Eosinophils Absolute: 0.2 10*3/uL (ref 0.0–0.7)
Eosinophils Relative: 2.3 % (ref 0.0–5.0)
HCT: 38.5 % — ABNORMAL LOW (ref 39.0–52.0)
Hemoglobin: 12.8 g/dL — ABNORMAL LOW (ref 13.0–17.0)
Lymphocytes Relative: 21.5 % (ref 12.0–46.0)
Lymphs Abs: 1.4 10*3/uL (ref 0.7–4.0)
MCHC: 33.3 g/dL (ref 30.0–36.0)
MCV: 91.4 fl (ref 78.0–100.0)
Monocytes Absolute: 0.4 10*3/uL (ref 0.1–1.0)
Monocytes Relative: 6.5 % (ref 3.0–12.0)
Neutro Abs: 4.6 10*3/uL (ref 1.4–7.7)
Neutrophils Relative %: 69.2 % (ref 43.0–77.0)
Platelets: 267 10*3/uL (ref 150.0–400.0)
RBC: 4.21 Mil/uL — ABNORMAL LOW (ref 4.22–5.81)
RDW: 14.6 % (ref 11.5–15.5)
WBC: 6.7 10*3/uL (ref 4.0–10.5)

## 2020-01-07 LAB — COMPREHENSIVE METABOLIC PANEL
ALT: 21 U/L (ref 0–53)
AST: 16 U/L (ref 0–37)
Albumin: 4.3 g/dL (ref 3.5–5.2)
Alkaline Phosphatase: 62 U/L (ref 39–117)
BUN: 26 mg/dL — ABNORMAL HIGH (ref 6–23)
CO2: 25 mEq/L (ref 19–32)
Calcium: 9.4 mg/dL (ref 8.4–10.5)
Chloride: 105 mEq/L (ref 96–112)
Creatinine, Ser: 2.94 mg/dL — ABNORMAL HIGH (ref 0.40–1.50)
GFR: 28.23 mL/min — ABNORMAL LOW (ref >60.00)
Glucose, Bld: 90 mg/dL (ref 70–99)
Potassium: 4.2 mEq/L (ref 3.5–5.1)
Sodium: 136 mEq/L (ref 135–145)
Total Bilirubin: 0.5 mg/dL (ref 0.2–1.2)
Total Protein: 7.2 g/dL (ref 6.0–8.3)

## 2020-01-07 MED ORDER — NA SULFATE-K SULFATE-MG SULF 17.5-3.13-1.6 GM/177ML PO SOLN: 1.0000 | Freq: Once | ORAL | 0 refills | Status: AC

## 2020-01-07 NOTE — Patient Instructions (Addendum)
Your provider has requested that you go to the basement level for lab work before leaving today. Press "B" on the elevator. The lab is located at the first door on the left as you exit the elevator.  Please contact your primary care physician regarding the sclerotic lesions to right ischium and right inferior pubic ramus that was shown on your CT scan report.   Please call [facility] to discuss bowel prep (Suprep) and clear liquid diet for Colonoscopy.  You have been scheduled for a colonoscopy. Please follow written instructions given to you at your visit today.  Please pick up your prep supplies at the pharmacy within the next 1-3 days. If you use inhalers (even only as needed), please bring them with you on the day of your procedure.

## 2020-01-07 NOTE — Progress Notes (Signed)
01/07/2020 Kevin Todd [id number] 05/05/1976   CHIEF COMPLAINT: Schedule a colonoscopy   HISTORY OF PRESENT ILLNESS:  Kevin Todd is a 44 year old male with a past medical history of obesity, poorly controlled hypertension, hyperlipidemia, pneumothorax 5 years ago, vitamin D deficiency and polycytic kidney disease.   He was admitted to the hospital on 09/17/2019 with hematuria, left upper and lower abdominal pain with bloody stools with blood clots x 2 to 3 days. In the ED, his creatinine level was 4.51. WBC 15.9. FOBT negative. An  CT abdomen and pelvis with mild distal colonic diverticulosis without diverticulitis, multicystic replacement of both kidneys suggestive of polycystic kidney disease. Hepatic steatosis and sclerotic lesions of the right ischium and right inferior pubic ramus were noted. Nephrology was consulted. His renal status stabilized and he did not have any obstructive symptoms nephrology. He was started on Amlodipine and Hydralazine for HTN. He was discharged home on 09/19/2019. He was advised to schedule a nephrology and GI consult.   He presents to our office today as referred by Kathe Becton FNP to schedule a colonoscopy. He denies having any further rectal bleeding. He is passing a normal brown formed BM every other day. He is taking 3 fiber gummies daily. Maternal grandfather with history of colon cancer. No GERD symptoms.   He was evaluated by nephrologist Dr. Corliss Parish with [facility] on 12/25/2019. Review of prior lab results in 2017 showed evidence of CKD with a creatinine level of around 2. Possible plans to start [place] for his PKD.   Labs 09/29/2019: WBC 9.0. Hg 11.5. HCT 33.3. PLT 349. Glu 87. BUN 33. Cr. 2.93. Alk phos 81. AST 15. ALT 20.   Abdominal/Pelvic CT 09/17/2019: 1. Mild distal colonic diverticulosis without diverticulitis. 2. Multi cystic replacement of both kidneys suggestive of polycystic kidney disease, however no prior exams  are available for comparison. There is left renal enlargement compared to right, with prominence of the left renal collecting system, perinephric and periureteric edema. Mild right perinephric edema to a lesser extent. Findings are suspicious for urinary tract infection. Possibility of recently passed renal calculus is also considered. There is no evidence of obstructive uropathy. 3. Scattered areas of higher density within the left kidney may represent normal renal parenchyma interspersed with renal cysts versus hyperdense cysts. Possibility of a solid lesion is difficult to exclude on noncontrast exam. Consider further evaluation with contrast-enhanced exam if renal function persists, MRI would provide greater soft tissue evaluation. This could be performed on an elective basis based on clinical scenario. 4. Sclerotic lesions of the right ischium and right inferior pubic ramus, indeterminate. 5. Hepatic steatosis.  Social History: He is single. He has 2 sons age 36 and 51 (son with sicle cell anemia and kidney cysts). He smokes 3 to 4 cigarettes daily since May, previously smoked 1 pack every other day x 10 years. No alcohol use. He smokes marijuana daily since age 1. No drug use. Limited caffeine.   Family History: Mother 49. Father deceased, history unknown.  He has 1 brother and 2 sisters who are healthy. Maternal grandmother with diabetes and hypertension. Maternal grandfather history of colon cancer diagnosed in his 49's.    No Known Allergies    Outpatient Encounter Medications as of 01/07/2020  Medication Sig  . acetaminophen (TYLENOL) 500 MG tablet Take 2 tablets (1,000 mg total) by mouth in the morning and at bedtime.  Marland Kitchen amLODipine (NORVASC) 10 MG tablet Take 1 tablet (10  mg total) by mouth daily.  Marland Kitchen atorvastatin (LIPITOR) 20 MG tablet Take 1 tablet (20 mg total) by mouth daily.  . hydrALAZINE (APRESOLINE) 25 MG tablet Take 1 tablet (25 mg total) by mouth every 8 (eight)  hours. (Patient not taking: Reported on 10/31/2019)  . nicotine (NICODERM CQ - DOSED IN MG/24 HOURS) 21 mg/24hr patch Place 1 patch (21 mg total) onto the skin daily. (Patient not taking: Reported on 10/31/2019)  . ondansetron (ZOFRAN) 4 MG tablet Take 1 tablet (4 mg total) by mouth every 6 (six) hours as needed for nausea. (Patient not taking: Reported on 10/31/2019)  . oxyCODONE (OXY IR/ROXICODONE) 5 MG immediate release tablet Take 1 tablet (5 mg total) by mouth every 8 (eight) hours as needed for moderate pain.  Marland Kitchen senna-docusate (SENOKOT-S) 8.6-50 MG tablet Take 1 tablet by mouth at bedtime as needed for mild constipation. (Patient not taking: Reported on 10/31/2019)  . traZODone (DESYREL) 100 MG tablet Take 1 tablet (100 mg total) by mouth at bedtime as needed for sleep.  . Vitamin D, Ergocalciferol, (DRISDOL) 1.25 MG (50000 UNIT) CAPS capsule Take 1 capsule (50,000 Units total) by mouth every 7 (seven) days.   No facility-administered encounter medications on file as of 01/07/2020.     REVIEW OF SYSTEMS: All other systems reviewed and negative except where noted in the History of Present Illness.   PHYSICAL EXAM: Vital Signs:  BP 120/86   Pulse 60   Ht _0  (1.727 m)   Wt (!) 304 lb 2 oz (138 kg)   BMI 46.24 kg/m   General: Well developed 44 year old obese male in no acute distress. Head: Normocephalic and atraumatic. Eyes:  Sclerae non-icteric, conjunctive pink. Ears: Normal auditory acuity. Mouth: Dentition intact. No ulcers or lesions.  Neck: Supple, no lymphadenopathy or thyromegaly.  Lungs: Clear bilaterally to auscultation without wheezes, crackles or rhonchi. Heart: Regular rate and rhythm. No murmur, rub or gallop appreciated.  Abdomen: Soft, nontender, non distended. No masses. No hepatosplenomegaly. Normoactive bowel sounds x 4 quadrants.  Rectal: Deferred.  Musculoskeletal: Symmetrical with no gross deformities. Skin: Warm and dry. No rash or lesions on visible  extremities. Extremities: No edema. Neurological: Alert oriented x 4, no focal deficits.  Psychological:  Alert and cooperative. Normal mood and affect.  ASSESSMENT AND PLAN:  50. 44 year old male with hematochezia 09/2019 presents to schedule a colonoscopy. + family history of colon cancer (maternal grandfather) -Colonoscopy benefits and risks discussed including risk with sedation, risk of bleeding, perforation and infection  -Patient to review bowel prep with nephrologist prior to proceeding with a colonoscopy  -CBC -Further follow up to be determined after colonoscopy completed.  -Miralax Q HS PRN for constipation  -Patient to call our office if recta bleeding recurs   2. Polycystic Kidney disease. Followed by nephrology.  -CMP  3. Hepatic steatosis per CTAP 09/17/2019. Obesity. -Discussed weight loss, exercise as tolerated   4. CTAP  09/17/2019 identified sclerotic lesions of the right ischium and right inferior pubic ramus -Patient to follow up with PCP regarding sclerotic lesions seen on CT    CC:  No ref. provider found

## 2020-01-09 NOTE — Progress Notes (Signed)
Agree with assessment and plan as outlined.  

## 2020-01-22 ENCOUNTER — Telehealth: Payer: Self-pay

## 2020-01-22 NOTE — Telephone Encounter (Signed)
Pt was scheduled to see Dr. Havery Moros on 01/23/20 at 1130. Pt did not show for his covid test. Called and spoke with patient. He stated that he received his second Moderna vaccine on 01/15/20. Per [place] patients are considered fully vaccinated 2 weeks after second dose. Transferred patient to [facility] schedulers to get rescheduled for next week.

## 2020-01-22 NOTE — Telephone Encounter (Signed)
Okay thanks for letting me know

## 2020-01-23 ENCOUNTER — Encounter: Payer: Self-pay | Admitting: Gastroenterology

## 2020-01-31 ENCOUNTER — Telehealth: Payer: Self-pay | Admitting: Gastroenterology

## 2020-02-01 ENCOUNTER — Encounter: Payer: Self-pay | Admitting: Certified Registered Nurse Anesthetist

## 2020-02-01 NOTE — Telephone Encounter (Signed)
Kevin Todd thank you for taking the time to take care of this patient's question, appreciate your help

## 2020-02-01 NOTE — Telephone Encounter (Signed)
Patient called to inform that he cannot afford the cost of the bowel prep, $150. He is requesting alternative prep. Provided instructions for him to do MiraLAX prep instead with Gatorade. Patient expressed understanding of the instructions and was very appreciative. I advised him to call back with any questions

## 2020-02-02 ENCOUNTER — Other Ambulatory Visit: Payer: Self-pay

## 2020-02-02 ENCOUNTER — Encounter: Payer: Self-pay | Admitting: Gastroenterology

## 2020-02-02 ENCOUNTER — Ambulatory Visit (AMBULATORY_SURGERY_CENTER): Payer: Self-pay | Admitting: Gastroenterology

## 2020-02-02 VITALS — BP 148/91 | HR 66 | Temp 96.2°F | Resp 20 | Ht 68.0 in | Wt 304.0 lb

## 2020-02-02 DIAGNOSIS — Z800 Family history of malignant neoplasm of digestive organs: Secondary | ICD-10-CM

## 2020-02-02 DIAGNOSIS — D123 Benign neoplasm of transverse colon: Secondary | ICD-10-CM

## 2020-02-02 DIAGNOSIS — K625 Hemorrhage of anus and rectum: Secondary | ICD-10-CM

## 2020-02-02 DIAGNOSIS — K6289 Other specified diseases of anus and rectum: Secondary | ICD-10-CM

## 2020-02-02 DIAGNOSIS — Z1211 Encounter for screening for malignant neoplasm of colon: Secondary | ICD-10-CM

## 2020-02-02 DIAGNOSIS — K621 Rectal polyp: Secondary | ICD-10-CM

## 2020-02-02 DIAGNOSIS — K629 Disease of anus and rectum, unspecified: Secondary | ICD-10-CM

## 2020-02-02 MED ORDER — SODIUM CHLORIDE 0.9 % IV SOLN: 500.0000 mL | INTRAVENOUS | Status: DC

## 2020-02-02 NOTE — Progress Notes (Signed)
Report given to PACU, vss 

## 2020-02-02 NOTE — Patient Instructions (Signed)
Handouts provided on polyps, diverticulosis and hemorrhoids.   YOU HAD AN ENDOSCOPIC PROCEDURE TODAY AT THE [facility]:   Refer to the procedure report that was given to you for any specific questions about what was found during the examination.  If the procedure report does not answer your questions, please call your gastroenterologist to clarify.  If you requested that your care partner not be given the details of your procedure findings, then the procedure report has been included in a sealed envelope for you to review at your convenience later.  YOU SHOULD EXPECT: Some feelings of bloating in the abdomen. Passage of more gas than usual.  Walking can help get rid of the air that was put into your GI tract during the procedure and reduce the bloating. If you had a lower endoscopy (such as a colonoscopy or flexible sigmoidoscopy) you may notice spotting of blood in your stool or on the toilet paper. If you underwent a bowel prep for your procedure, you may not have a normal bowel movement for a few days.  Please Note:  You might notice some irritation and congestion in your nose or some drainage.  This is from the oxygen used during your procedure.  There is no need for concern and it should clear up in a day or so.  SYMPTOMS TO REPORT IMMEDIATELY:   Following lower endoscopy (colonoscopy or flexible sigmoidoscopy):  Excessive amounts of blood in the stool  Significant tenderness or worsening of abdominal pains  Swelling of the abdomen that is new, acute  Fever of 100F or higher   For urgent or emergent issues, a gastroenterologist can be reached at any hour by calling [phone]. Do not use MyChart messaging for urgent concerns.    DIET:  We do recommend a small meal at first, but then you may proceed to your regular diet.  Drink plenty of fluids but you should avoid alcoholic beverages for 24 hours.  ACTIVITY:  You should plan to take it easy for the rest of today and  you should NOT DRIVE or use heavy machinery until tomorrow (because of the sedation medicines used during the test).    FOLLOW UP: Our staff will call the number listed on your records 48-72 hours following your procedure to check on you and address any questions or concerns that you may have regarding the information given to you following your procedure. If we do not reach you, we will leave a message.  We will attempt to reach you two times.  During this call, we will ask if you have developed any symptoms of COVID 19. If you develop any symptoms (ie: fever, flu-like symptoms, shortness of breath, cough etc.) before then, please call [phone].  If you test positive for Covid 19 in the 2 weeks post procedure, please call and report this information to us.    If any biopsies were taken you will be contacted by phone or by letter within the next 1-3 weeks.  Please call us at [phone] if you have not heard about the biopsies in 3 weeks.    SIGNATURES/CONFIDENTIALITY: You and/or your care partner have signed paperwork which will be entered into your electronic medical record.  These signatures attest to the fact that that the information above on your After Visit Summary has been reviewed and is understood.  Full responsibility of the confidentiality of this discharge information lies with you and/or your care-partner.  

## 2020-02-02 NOTE — Progress Notes (Signed)
Nasal tube 7.0 placed in right nare without trauma, vss

## 2020-02-02 NOTE — Op Note (Signed)
[facility] Patient Name: Kevin Todd Procedure Date: 02/02/2020 12:47 PM MRN: [id number] Endoscopist: Remo Lipps P. Havery Moros , MD Age: 44 Referring MD:  Date of Birth: 03-Dec-1975 Gender: Male Account #: [id number] Procedure:                Colonoscopy Indications:              history of rectal bleeding, first colonoscopy,                            grandfather and uncle with colon cancer Medicines:                Monitored Anesthesia Care Procedure:                Pre-Anesthesia Assessment:                           - Prior to the procedure, a History and Physical                            was performed, and patient medications and                            allergies were reviewed. The patient's tolerance of                            previous anesthesia was also reviewed. The risks                            and benefits of the procedure and the sedation                            options and risks were discussed with the patient.                            All questions were answered, and informed consent                            was obtained. Prior Anticoagulants: The patient has                            taken no previous anticoagulant or antiplatelet                            agents. ASA Grade Assessment: III - A patient with                            severe systemic disease. After reviewing the risks                            and benefits, the patient was deemed in                            satisfactory condition to undergo the procedure.  After obtaining informed consent, the colonoscope                            was passed under direct vision. Throughout the                            procedure, the patient's blood pressure, pulse, and                            oxygen saturations were monitored continuously. The                            Colonoscope was introduced through the anus and                            advanced to the the  terminal ileum, with                            identification of the appendiceal orifice and IC                            valve. The colonoscopy was performed without                            difficulty. The patient tolerated the procedure                            well. The quality of the bowel preparation was                            adequate. The terminal ileum, ileocecal valve,                            appendiceal orifice, and rectum were photographed. Scope In: 1:31:29 PM Scope Out: 1:54:51 PM Scope Withdrawal Time: 0 hours 21 minutes 18 seconds  Total Procedure Duration: 0 hours 23 minutes 22 seconds  Findings:                 The perianal and digital rectal examinations were                            normal.                           The terminal ileum appeared normal.                           There was a medium-sized lipoma, in the proximal                            transverse colon.                           Two sessile polyps were found in the splenic  flexure. The polyps were 2 to 3 mm in size. These                            polyps were removed with a cold snare. Resection                            and retrieval were complete.                           Multiple small-mouthed diverticula were found in                            the sigmoid colon.                           An area of altered mucosa was found in the distal                            rectum (change in normal vascularity, granular).                            Suspect benign normal variant vs. hyperplastic                            change, biopsies were taken with a cold forceps for                            histology to ensure no flat adenoma.                           Internal hemorrhoids were found during retroflexion.                           The exam was otherwise without abnormality. Complications:            No immediate complications. Estimated blood loss:                             Minimal. Estimated Blood Loss:     Estimated blood loss was minimal. Impression:               - The examined portion of the ileum was normal.                           - Medium-sized lipoma in the proximal transverse                            colon.                           - Two 2 to 3 mm polyps at the splenic flexure,                            removed with a cold snare. Resected and retrieved.                           -  Diverticulosis in the sigmoid colon.                           - Altered mucosa in the distal rectum as described.                            Biopsied.                           - Internal hemorrhoids.                           - The examination was otherwise normal.                           Suspect hemorrhoids are most likely cause for                            bleeding symptoms based on this exam. Symptoms have                            since resolved. Recommendation:           - Patient has a contact number available for                            emergencies. The signs and symptoms of potential                            delayed complications were discussed with the                            patient. Return to normal activities tomorrow.                            Written discharge instructions were provided to the                            patient.                           - Resume previous diet.                           - Continue present medications.                           - Await pathology results.                           - Follow up as needed if rectal bleeding recurs Remo Lipps P. Tatum Corl, MD 02/02/2020 2:02:11 PM This report has been signed electronically.

## 2020-02-02 NOTE — Progress Notes (Signed)
Called to room to assist during endoscopic procedure.  Patient ID and intended procedure confirmed with present staff. Received instructions for my participation in the procedure from the performing physician.  

## 2020-02-02 NOTE — Progress Notes (Signed)
Vs AG 

## 2020-02-04 ENCOUNTER — Telehealth: Payer: Self-pay | Admitting: *Deleted

## 2020-02-04 NOTE — Telephone Encounter (Signed)
°  Follow up Call-  Call back number 02/02/2020  Post procedure Call Back phone  # [phone]  Permission to leave phone message Yes  Some recent data might be hidden     Patient questions:  Do you have a fever, pain , or abdominal swelling? No. Pain Score  0 *  Have you tolerated food without any problems? Yes.    Have you been able to return to your normal activities? Yes.    Do you have any questions about your discharge instructions: Diet   No. Medications  No. Follow up visit  No.  Do you have questions or concerns about your Care? No.  Actions: * If pain score is 4 or above: No action needed, pain <4.  1. Have you developed a fever since your procedure? no  2.   Have you had an respiratory symptoms (SOB or cough) since your procedure? no  3.   Have you tested positive for COVID 19 since your procedure no  4.   Have you had any family members/close contacts diagnosed with the COVID 19 since your procedure?  no   If yes to any of these questions please route to Joylene John, RN and Joella Prince, RN

## 2020-04-30 ENCOUNTER — Ambulatory Visit: Payer: Self-pay | Admitting: Family Medicine

## 2020-05-24 ENCOUNTER — Ambulatory Visit: Payer: Self-pay | Admitting: Family Medicine

## 2020-06-09 ENCOUNTER — Ambulatory Visit: Payer: Self-pay | Admitting: Family Medicine

## 2020-06-18 ENCOUNTER — Ambulatory Visit (INDEPENDENT_AMBULATORY_CARE_PROVIDER_SITE_OTHER): Payer: Self-pay | Admitting: Family Medicine

## 2020-06-18 ENCOUNTER — Encounter: Payer: Self-pay | Admitting: Family Medicine

## 2020-06-18 ENCOUNTER — Other Ambulatory Visit: Payer: Self-pay

## 2020-06-18 VITALS — BP 171/85 | HR 71 | Temp 97.5°F | Ht 68.0 in | Wt 309.4 lb

## 2020-06-18 DIAGNOSIS — M25559 Pain in unspecified hip: Secondary | ICD-10-CM

## 2020-06-18 DIAGNOSIS — Q613 Polycystic kidney, unspecified: Secondary | ICD-10-CM

## 2020-06-18 DIAGNOSIS — G8929 Other chronic pain: Secondary | ICD-10-CM

## 2020-06-18 DIAGNOSIS — I10 Essential (primary) hypertension: Secondary | ICD-10-CM

## 2020-06-18 DIAGNOSIS — G4700 Insomnia, unspecified: Secondary | ICD-10-CM

## 2020-06-18 DIAGNOSIS — Z09 Encounter for follow-up examination after completed treatment for conditions other than malignant neoplasm: Secondary | ICD-10-CM

## 2020-06-18 MED ORDER — KETOROLAC TROMETHAMINE 30 MG/ML IJ SOLN
30.0000 mg | Freq: Once | INTRAMUSCULAR | Status: AC
  Administered 2020-06-18: 30 mg via INTRAMUSCULAR

## 2020-06-18 MED ORDER — MELOXICAM 15 MG PO TABS
15.0000 mg | ORAL_TABLET | Freq: Every day | ORAL | 3 refills | Status: DC
Start: 2020-06-18 — End: 2021-04-14

## 2020-06-18 NOTE — Patient Instructions (Signed)
Polycystic Kidney Disease, Adult  Polycystic kidney disease (PKD) is a disease in which many small fluid-filled sacs (cysts) grow in the kidneys. If too many cysts grow, they will damage the healthy kidney tissue. The damage will affect how the kidneys work. It may also lead to kidney failure. People with PKD may have cysts in their liver, pancreas, spleen, ovaries, and large bowel as well. This condition is chronic, meaning that it stays for a long time. It is inherited from one's parents and is usually present at birth. It can also vary in severity. In mild cases, the kidneys continue to work well. However, severe cases can cause high blood pressure and heart or blood vessel disease. This may result in heart valve problems, bleeding in the brain, and loss of kidney function. What are the causes? The condition is passed down from parents. Genetic changes cause cysts to develop and damage healthy organs. What increases the risk? You are more likely to have this condition if you have a parent with polycystic kidney disease. Parents with PKD should consider having genetic counseling before they have children. What are the signs or symptoms? Symptoms of this condition include:  Pain in the middle and the side of the back below your ribs (flank pain).  Bloody urine (hematuria).  A feeling of fullness or heaviness in your abdomen.  Increased urination.  High blood pressure.  Urinary tract infections or kidney infections. Some people may not have any signs or symptoms. Symptoms usually develop slowly and may not show until the kidney damage becomes severe. It is possible to have kidney disease for years without having symptoms. How is this diagnosed? This condition is diagnosed based on your symptoms and medical history as well as a physical exam. Tests may also be done, including:  Ultrasound.  CT scan.  MRI.  Genetic tests. How is this treated? There is no cure for this condition.  Treatment focuses on managing symptoms, which may include:  Draining cysts. This may be needed if the cysts are large and are putting pressure on other organs or damaging the kidney tissue. This may require surgery.  Medicine to treat hypertension. This can help slow down further damage to the kidneys.  Treating any bladder or kidney infections. Treatment may include using antibiotic medicines.  Making lifestyle changes, such as: ? Reducing salt (sodium) intake. ? Stopping smoking. ? Avoiding caffeine. ? Exercising regularly. ? Reducing stress. ? Drinking plenty of water. You may be referred to a health care provider who specializes in kidney disease (nephrologist). Follow these instructions at home: Lifestyle  Follow a healthy diet as told by your health care provider. You may need to limit sodium and avoid caffeine.  Drink enough fluid to keep your urine pale yellow.  Try to reduce stress in your life. Use meditation or relaxing hobbies.  Exercise often as told by your health care provider.  Do not use any products that contain nicotine or tobacco, such as cigarettes, e-cigarettes, and chewing tobacco. If you need help quitting, ask your health care provider.   General instructions  Take over-the-counter and prescription medicines only as told by your health care provider.  If you were prescribed an antibiotic medicine, take it as told by your health care provider. Do not stop taking the antibiotic even if you start to feel better.  Work with your health care provider to manage your blood pressure.  Talk with your health care provider about genetic screening. This is especially important if you  have a family member with this condition or if you are planning to have children.  Keep all follow-up visits. This is important. Your kidney function will need to be checked regularly. Where to find more information  [place]: kidney.org  Polycystic Kidney Disease  Foundation: [place]: kidneyfund.org Contact a health care provider if:  You develop new symptoms.  Your symptoms get worse.  Your hands and feet are swollen.  You have a fever. Get help right away if:  You have shortness of breath or trouble breathing.  You have a sudden, severe headache with no known cause. This may include a stiff neck.  You have sudden nausea or vomiting with a severe headache.  You have a seizure.  You have any symptoms of a stroke. "BE FAST" is an easy way to remember the main warning signs of a stroke: ? B - Balance. Signs are dizziness, sudden trouble walking, or loss of balance. ? E - Eye. Signs are trouble seeing or a sudden change in vision. ? F - Face. Signs are sudden weakness or numbness of the face, or the face or eyelid drooping on one side. ? A - Arms. Signs are weakness or numbness in an arm. This happens suddenly and usually on one side of the body. ? S - Speech. Signs are sudden trouble speaking, slurred speech, or trouble understanding what people say. ? T - Time. Time to call emergency services. Write down what time symptoms started. These symptoms may represent a serious problem that is an emergency. Do not wait to see if the symptoms will go away. Get medical help right away. Call your local emergency services (911 in the [place] Do not drive yourself to the hospital. Summary  Polycystic kidney disease (PKD) is a condition in which many small fluid-filled sacs (cysts) grow in the kidneys.  Diagnosis of polycystic kidney disease may include an ultrasound, CT scan, MRI, and genetic tests.  Treatment focuses on managing symptoms, which may include medicines, diet, and exercise. This information is not intended to replace advice given to you by your health care provider. Make sure you discuss any questions you have with your health care provider. Document Revised: 08/21/2019 Document Reviewed: 08/21/2019 Elsevier  Patient Education  [place].

## 2020-06-18 NOTE — Progress Notes (Signed)
Patient [facility]   Established Patient Office Visit  Subjective:  Patient ID: Kevin Todd, male    DOB: 1975/05/19  Age: 45 y.o. MRN: [id number]  CC:  Chief Complaint  Patient presents with  . Follow-up    6 month follow up , pt said he has no concerns , pt said that he didn't here from the pain clinic .    HPI Kevin Todd is a 45 year old male who presents for Follow Up today.   Patient Active Problem List   Diagnosis Date Noted  . Chronic hip pain 11/03/2019  . AKI (acute kidney injury) ([place])   . LLQ abdominal pain   . Rectal bleeding   . ARF (acute renal failure) ([place]) 09/17/2019  . Hypertensive urgency 09/17/2019  . Gross hematuria 09/17/2019  . Acute pyelonephritis 09/17/2019  . Complicated UTI (urinary tract infection) 09/17/2019  . Polycystic kidney disease 09/17/2019  . Essential hypertension   . Chronic kidney disease   . Chest pain 02/28/2016  . Acute kidney injury ([place]) 02/28/2016  . Morbid obesity ([place]) 02/28/2016  . Tobacco abuse 02/28/2016  . Hypertensive emergency 02/28/2016  . Spontaneous pneumothorax 01/24/2013   Current Status: Since his last office visit, he is doing well with no complaints. He currently follows up with [facility] for Polycystic Kidney Disease, which he is on a new medication Jynarque taken  BID. He states that he has side effects of increased urination, but he feels that medication is improving his condition. His blood pressures are elevated today, as he has not taken his blood pressure medications as of yet today. He denies fevers, chills, fatigue, recent infections, weight loss, and night sweats. He has not had any headaches, visual changes, dizziness, and falls. No chest pain, heart palpitations, cough and shortness of breath reported. Denies GI problems such as nausea, vomiting, diarrhea, and constipation. He has no reports of blood in stools, dysuria and hematuria. No  depression or anxiety reported today. He is taking all medications as prescribed. He denies pain today.   Past Medical History:  Diagnosis Date  . Chronic kidney disease   . Elevated creatine kinase 09/2019  . Hypertension   . Insomnia 10/2019  . Pneumothorax on right   . Vitamin D deficiency 09/2019    History reviewed. No pertinent surgical history.  Family History  Problem Relation Age of Onset  . Hypertension Mother   . Colon cancer Maternal Grandfather   . Stomach cancer Neg Hx   . Esophageal cancer Neg Hx   . Pancreatic cancer Neg Hx     Social History   Socioeconomic History  . Marital status: Single    Spouse name: Not on file  . Number of children: Not on file  . Years of education: Not on file  . Highest education level: Not on file  Occupational History  . Not on file  Tobacco Use  . Smoking status: Current Every Day Smoker    Packs/day: 0.25    Types: Cigarettes  . Smokeless tobacco: Never Used  . Tobacco comment: 4 cigarettes/pd  Vaping Use  . Vaping Use: Never used  Substance and Sexual Activity  . Alcohol use: Not Currently  . Drug use: No  . Sexual activity: Yes  Other Topics Concern  . Not on file  Social History Narrative  . Not on file   Social Determinants of Health   Financial Resource Strain: Not on file  Food Insecurity: Not on file  Transportation Needs: Not on file  Physical Activity: Not on file  Stress: Not on file  Social Connections: Not on file  Intimate Partner Violence: Not on file    Outpatient Medications Prior to Visit  Medication Sig Dispense Refill  . acetaminophen (TYLENOL) 500 MG tablet Take 2 tablets (1,000 mg total) by mouth in the morning and at bedtime. 120 tablet 6  . amLODipine (NORVASC) 10 MG tablet Take 1 tablet (10 mg total) by mouth daily. 30 tablet 1  . hydrALAZINE (APRESOLINE) 50 MG tablet Take 50 mg by mouth 3 (three) times daily.    . metoprolol succinate (TOPROL-XL) 50 MG 24 hr tablet Take 50 mg by  mouth daily.    . traZODone (DESYREL) 100 MG tablet Take 1 tablet (100 mg total) by mouth at bedtime as needed for sleep. 30 tablet 6  . Vitamin D, Ergocalciferol, (DRISDOL) 1.25 MG (50000 UNIT) CAPS capsule Take 1 capsule (50,000 Units total) by mouth every 7 (seven) days. 5 capsule 6  . oxyCODONE (OXY IR/ROXICODONE) 5 MG immediate release tablet Take 1 tablet (5 mg total) by mouth every 8 (eight) hours as needed for moderate pain. (Patient not taking: Reported on 06/18/2020) 30 tablet 0  . hydrALAZINE (APRESOLINE) 25 MG tablet Take 1 tablet (25 mg total) by mouth every 8 (eight) hours. 90 tablet 0   No facility-administered medications prior to visit.    No Known Allergies  ROS Review of Systems  Constitutional: Negative.   HENT: Negative.   Eyes: Negative.   Respiratory: Negative.   Cardiovascular: Negative.   Gastrointestinal: Negative.   Endocrine: Negative.   Genitourinary: Negative.   Musculoskeletal: Negative.   Skin: Negative.   Allergic/Immunologic: Negative.   Neurological: Positive for dizziness (occasional ) and headaches (occasional ).  Hematological: Negative.   Psychiatric/Behavioral: Negative.       Objective:    Physical Exam Vitals and nursing note reviewed.  Constitutional:      Appearance: Normal appearance.  HENT:     Head: Normocephalic and atraumatic.     Nose: Nose normal.     Mouth/Throat:     Mouth: Mucous membranes are moist.     Pharynx: Oropharynx is clear.  Cardiovascular:     Rate and Rhythm: Normal rate and regular rhythm.     Pulses: Normal pulses.     Heart sounds: Normal heart sounds.  Pulmonary:     Effort: Pulmonary effort is normal.     Breath sounds: Normal breath sounds.  Abdominal:     General: Bowel sounds are normal.     Palpations: Abdomen is soft.  Musculoskeletal:        General: Normal range of motion.     Cervical back: Normal range of motion and neck supple.  Skin:    General: Skin is warm and dry.  Neurological:      General: No focal deficit present.     Mental Status: He is alert and oriented to person, place, and time.  Psychiatric:        Behavior: Behavior normal.        Thought Content: Thought content normal.        Judgment: Judgment normal.     BP (!) 171/85 (BP Location: Left Arm, Patient Position: Sitting, Cuff Size: Large)   Pulse 71   Temp (!) 97.5 F (36.4 C)   Ht 5\' 8"  (1.727 m)   Wt (!) 309 lb 6.4 oz (140.3 kg)  SpO2 97%   BMI 47.04 kg/m  Wt Readings from Last 3 Encounters:  06/18/20 (!) 309 lb 6.4 oz (140.3 kg)  02/02/20 (!) 304 lb (137.9 kg)  01/07/20 (!) 304 lb 2 oz (138 kg)     Health Maintenance Due  Topic Date Due  . Hepatitis C Screening  Never done  . COVID-19 Vaccine (1) Never done  . TETANUS/TDAP  Never done  . INFLUENZA VACCINE  Never done    There are no preventive care reminders to display for this patient.  Lab Results  Component Value Date   TSH 1.890 09/29/2019   Lab Results  Component Value Date   WBC 6.7 01/07/2020   HGB 12.8 (L) 01/07/2020   HCT 38.5 (L) 01/07/2020   MCV 91.4 01/07/2020   PLT 267.0 01/07/2020   Lab Results  Component Value Date   NA 136 01/07/2020   K 4.2 01/07/2020   CO2 25 01/07/2020   GLUCOSE 90 01/07/2020   BUN 26 (H) 01/07/2020   CREATININE 2.94 (H) 01/07/2020   BILITOT 0.5 01/07/2020   ALKPHOS 62 01/07/2020   AST 16 01/07/2020   ALT 21 01/07/2020   PROT 7.2 01/07/2020   ALBUMIN 4.3 01/07/2020   CALCIUM 9.4 01/07/2020   ANIONGAP 10 09/19/2019   GFR 28.23 (L) 01/07/2020   Lab Results  Component Value Date   CHOL 190 09/18/2019   Lab Results  Component Value Date   HDL 39 (L) 09/18/2019   Lab Results  Component Value Date   LDLCALC 126 (H) 09/18/2019   Lab Results  Component Value Date   TRIG 124 09/18/2019   Lab Results  Component Value Date   CHOLHDL 4.9 09/18/2019   Lab Results  Component Value Date   HGBA1C 5.8 (A) 09/29/2019    Assessment & Plan:   1. Essential  hypertension He will take medications prior to appointment before his next office visit. He will continue to take medications as prescribed, to decrease high sodium intake, excessive alcohol intake, increase potassium intake, smoking cessation, and increase physical activity of at least 30 minutes of cardio activity daily. He will continue to follow Heart Healthy or DASH diet. - POCT Urinalysis Dipstick  2. Chronic hip pain, unspecified laterality  3. Insomnia, unspecified type  4. Polycystic kidney disease He continues to follow up with Nephrology as needed.   5. Follow up He will follow up in 6 months.   No orders of the defined types were placed in this encounter.   Orders Placed This Encounter  Procedures  . POCT Urinalysis Dipstick    Referral Orders  No referral(s) requested today    Kathe Becton, MSN, ANE, FNP-BC [facility] [address] [place], [place] [address] [phone] fax    Problem List Items Addressed This Visit      Cardiovascular and Mediastinum   Essential hypertension - Primary   Relevant Medications   hydrALAZINE (APRESOLINE) 50 MG tablet   Other Relevant Orders   POCT Urinalysis Dipstick     Genitourinary   Polycystic kidney disease     Other   Chronic hip pain    Other Visit Diagnoses    Insomnia, unspecified type       Follow up          No orders of the defined types were placed in this encounter.   Follow-up: No follow-ups on file.    Azzie Glatter,  FNP 

## 2020-06-19 ENCOUNTER — Other Ambulatory Visit: Payer: Self-pay | Admitting: Family Medicine

## 2020-06-19 DIAGNOSIS — E559 Vitamin D deficiency, unspecified: Secondary | ICD-10-CM

## 2020-06-21 ENCOUNTER — Encounter: Payer: Self-pay | Admitting: Family Medicine

## 2020-07-24 ENCOUNTER — Other Ambulatory Visit: Payer: Self-pay | Admitting: Family Medicine

## 2020-07-24 DIAGNOSIS — G4700 Insomnia, unspecified: Secondary | ICD-10-CM

## 2020-09-02 DIAGNOSIS — M25859 Other specified joint disorders, unspecified hip: Secondary | ICD-10-CM | POA: Insufficient documentation

## 2020-09-04 ENCOUNTER — Other Ambulatory Visit: Payer: Self-pay

## 2020-09-04 ENCOUNTER — Encounter: Payer: Self-pay | Admitting: Emergency Medicine

## 2020-09-04 ENCOUNTER — Ambulatory Visit
Admission: EM | Admit: 2020-09-04 | Discharge: 2020-09-04 | Disposition: A | Discharge status: Home / Self Care | Payer: 59 | Attending: Family Medicine | Admitting: Family Medicine

## 2020-09-04 DIAGNOSIS — I10 Essential (primary) hypertension: Secondary | ICD-10-CM

## 2020-09-04 DIAGNOSIS — M25561 Pain in right knee: Secondary | ICD-10-CM | POA: Diagnosis not present

## 2020-09-04 HISTORY — DX: Iliac crest spur, left hip: M76.22

## 2020-09-04 MED ORDER — HYDROCODONE-ACETAMINOPHEN 5-325 MG PO TABS: 1.0000 | ORAL_TABLET | Freq: Four times a day (QID) | ORAL | 0 refills | Status: DC | PRN

## 2020-09-04 NOTE — ED Triage Notes (Signed)
Patient c/o RT leg pain that happened last night.   Patient states " I was at work, some pittbulls came into the building and I ran, my leg got caught on some machine".   Patient endorses pain has progressively became worst throughout the night.   Patient denies fall.   Patient endorses pain from heel to knee of RT leg.   Patient endorses difficulty with ambulation.   Patient has used Tylenol with no relief of symptoms.

## 2020-09-04 NOTE — ED Provider Notes (Signed)
[facility]   [id number] 09/04/20 Arrival Time: 6767  ASSESSMENT & PLAN:  1. Arthralgia of right lower leg   2. Elevated blood pressure reading in office with diagnosis of hypertension     No indications for plain imaging of leg at this time. Achilles tendon intact; likely Achilles strain. Discussed.   Reports he cannot take NSAIDS.  Meds ordered this encounter  Medications  . HYDROcodone-acetaminophen (NORCO/VICODIN) 5-325 MG tablet    Sig: Take 1 tablet by mouth every 6 (six) hours as needed for moderate pain or severe pain.    Dispense:  12 tablet    Refill:  0    Orders Placed This Encounter  Procedures  . Apply CAM boot  . Crutches   Work note provided.  Recommend:  Follow-up Information    Schedule an appointment as soon as possible for a visit  with [facility].   Contact information: [address] [place] [phone]               Discharge Instructions      Be aware, you have been prescribed pain medications that may cause drowsiness. While taking this medication, do not take any other medications containing acetaminophen (Tylenol). Do not combine with alcohol or other illicit drugs. Please do not drive, operate heavy machinery, or take part in activities that require making important decisions while on this medication as your judgement may be clouded.  Your blood pressure was noted to be elevated during your visit today. If you are currently taking medication for high blood pressure, please ensure you are taking this as directed. If you do not have a history of high blood pressure and your blood pressure remains persistently elevated, you may need to begin taking a medication at some point. You may return here within the next few days to recheck if unable to see your primary care provider or if you do not have a one.  BP (!) 157/89 (BP Location: Right Arm)   Pulse 77   Temp 98 F  (36.7 C) (Oral)   Resp 18   Ht 5\' 8"  (1.727 m)   Wt (!) 138.3 kg   SpO2 97%   BMI 46.38 kg/m   BP Readings from Last 3 Encounters:  09/04/20 (!) 157/89  06/18/20 (!) 171/85  02/02/20 (!) 148/91        Reviewed expectations re: course of current medical issues. Questions answered. Outlined signs and symptoms indicating need for more acute intervention. Patient verbalized understanding. After Visit Summary given.  SUBJECTIVE: History from: patient. Kevin Todd is a 45 y.o. male who reports persistent marked pain of his right posterior lower leg; described as aching; without radiation. Onset: abrupt. First noted: yesterday. Injury/trama: reports 'leg caught under a table'; 'running from a dog'; immediate pain; able to bear weight but with pain. Symptoms have progressed to a point and plateaued since beginning. Aggravating factors: weight bearing. Alleviating factors: have not been identified. Associated symptoms: none reported. Extremity sensation changes or weakness: none. Self treatment: acetaminophen, with no relief.  History of similar: no.  History reviewed. No pertinent surgical history.   Increased blood pressure noted today. Reports that he is treated for HTN.  He reports taking medications as instructed, no chest pain on exertion, no dyspnea on exertion, no swelling of ankles, no orthostatic dizziness or lightheadedness, no orthopnea or paroxysmal nocturnal dyspnea and no palpitations.  OBJECTIVE:  Vitals:   09/04/20 6283  09/04/20 0920  BP:  (!) 157/89  Pulse:  77  Resp:  18  Temp:  98 F (36.7 C)  TempSrc:  Oral  SpO2:  97%  Weight: (!) 138.3 kg   Height: 5\' 8"  (1.727 m)     General appearance: alert; no distress HEENT: [place]; AT Neck: supple with FROM Resp: unlabored respirations Extremities: RLE: warm with well perfused appearance; poorly localized significant tenderness over right posterior lower leg from Achilles insertion into gastrocnemius;  without gross deformities; swelling: none; bruising: none; ankle ROM: limited by reported pain; normal dorsiflexion and plantarflexion of foot CV: brisk extremity capillary refill of RLE; 2+ DP pulse of RLE. Skin: warm and dry; no visible rashes Neurologic: normal sensation and strength of RLE Psychological: alert and cooperative; normal mood and affect    No Known Allergies  Past Medical History:  Diagnosis Date  . Chronic kidney disease   . Elevated creatine kinase 09/2019  . Hypertension   . Iliac crest spur of left hip   . Insomnia 10/2019  . Pneumothorax on right   . Vitamin D deficiency 09/2019   Social History   Socioeconomic History  . Marital status: Single    Spouse name: Not on file  . Number of children: Not on file  . Years of education: Not on file  . Highest education level: Not on file  Occupational History  . Not on file  Tobacco Use  . Smoking status: Current Every Day Smoker    Packs/day: 0.25    Types: Cigarettes  . Smokeless tobacco: Never Used  . Tobacco comment: 4 cigarettes/pd  Vaping Use  . Vaping Use: Never used  Substance and Sexual Activity  . Alcohol use: Not Currently  . Drug use: No  . Sexual activity: Yes  Other Topics Concern  . Not on file  Social History Narrative  . Not on file   Social Determinants of Health   Financial Resource Strain: Not on file  Food Insecurity: Not on file  Transportation Needs: Not on file  Physical Activity: Not on file  Stress: Not on file  Social Connections: Not on file   Family History  Problem Relation Age of Onset  . Hypertension Mother   . Colon cancer Maternal Grandfather   . Stomach cancer Neg Hx   . Esophageal cancer Neg Hx   . Pancreatic cancer Neg Hx    History reviewed. No pertinent surgical history.    Vanessa Kick, MD 09/04/20 1037

## 2020-09-04 NOTE — ED Notes (Signed)
Patient didn't want crutches. Patient states " I have crutches of my own".

## 2020-09-04 NOTE — Discharge Instructions (Addendum)
Be aware, you have been prescribed pain medications that may cause drowsiness. While taking this medication, do not take any other medications containing acetaminophen (Tylenol). Do not combine with alcohol or other illicit drugs. Please do not drive, operate heavy machinery, or take part in activities that require making important decisions while on this medication as your judgement may be clouded.  Your blood pressure was noted to be elevated during your visit today. If you are currently taking medication for high blood pressure, please ensure you are taking this as directed. If you do not have a history of high blood pressure and your blood pressure remains persistently elevated, you may need to begin taking a medication at some point. You may return here within the next few days to recheck if unable to see your primary care provider or if you do not have a one.  BP (!) 157/89 (BP Location: Right Arm)   Pulse 77   Temp 98 F (36.7 C) (Oral)   Resp 18   Ht 5\' 8"  (1.727 m)   Wt (!) 138.3 kg   SpO2 97%   BMI 46.38 kg/m   BP Readings from Last 3 Encounters:  09/04/20 (!) 157/89  06/18/20 (!) 171/85  02/02/20 (!) 148/91

## 2020-09-14 ENCOUNTER — Ambulatory Visit: Payer: Self-pay | Admitting: Family Medicine

## 2021-02-08 ENCOUNTER — Other Ambulatory Visit: Payer: Self-pay | Admitting: Student

## 2021-02-08 DIAGNOSIS — M25571 Pain in right ankle and joints of right foot: Secondary | ICD-10-CM

## 2021-02-13 ENCOUNTER — Ambulatory Visit
Admission: RE | Admit: 2021-02-13 | Discharge: 2021-02-13 | Disposition: A | Discharge status: Home / Self Care | Payer: Worker's Compensation | Source: Ambulatory Visit | Attending: Student | Admitting: Student

## 2021-02-13 ENCOUNTER — Other Ambulatory Visit: Payer: Self-pay

## 2021-02-13 DIAGNOSIS — M25571 Pain in right ankle and joints of right foot: Secondary | ICD-10-CM

## 2021-03-09 DIAGNOSIS — M6701 Short Achilles tendon (acquired), right ankle: Secondary | ICD-10-CM | POA: Insufficient documentation

## 2021-03-16 ENCOUNTER — Other Ambulatory Visit: Payer: Self-pay | Admitting: Nurse Practitioner

## 2021-03-16 ENCOUNTER — Other Ambulatory Visit (HOSPITAL_COMMUNITY): Payer: Self-pay | Admitting: Orthopedic Surgery

## 2021-03-16 DIAGNOSIS — G4700 Insomnia, unspecified: Secondary | ICD-10-CM

## 2021-04-04 ENCOUNTER — Other Ambulatory Visit: Payer: Self-pay

## 2021-04-04 ENCOUNTER — Encounter (HOSPITAL_BASED_OUTPATIENT_CLINIC_OR_DEPARTMENT_OTHER): Payer: Self-pay | Admitting: Orthopedic Surgery

## 2021-04-11 ENCOUNTER — Encounter (HOSPITAL_BASED_OUTPATIENT_CLINIC_OR_DEPARTMENT_OTHER)
Admission: RE | Admit: 2021-04-11 | Discharge: 2021-04-11 | Disposition: A | Discharge status: Home / Self Care | Payer: 59 | Source: Ambulatory Visit | Attending: Orthopedic Surgery | Admitting: Orthopedic Surgery

## 2021-04-11 DIAGNOSIS — Z01818 Encounter for other preprocedural examination: Secondary | ICD-10-CM | POA: Insufficient documentation

## 2021-04-11 DIAGNOSIS — N289 Disorder of kidney and ureter, unspecified: Secondary | ICD-10-CM | POA: Diagnosis not present

## 2021-04-11 LAB — COMPREHENSIVE METABOLIC PANEL
ALT: 21 U/L (ref 0–44)
AST: 17 U/L (ref 15–41)
Albumin: 3.6 g/dL (ref 3.5–5.0)
Alkaline Phosphatase: 53 U/L (ref 38–126)
Anion gap: 8 (ref 5–15)
BUN: 24 mg/dL — ABNORMAL HIGH (ref 6–20)
CO2: 23 mmol/L (ref 22–32)
Calcium: 9.1 mg/dL (ref 8.9–10.3)
Chloride: 109 mmol/L (ref 98–111)
Creatinine, Ser: 3.38 mg/dL — ABNORMAL HIGH (ref 0.61–1.24)
GFR, Estimated: 22 mL/min — ABNORMAL LOW (ref >60)
Glucose, Bld: 86 mg/dL (ref 70–99)
Potassium: 4.8 mmol/L (ref 3.5–5.1)
Sodium: 140 mmol/L (ref 135–145)
Total Bilirubin: 0.4 mg/dL (ref 0.3–1.2)
Total Protein: 6.3 g/dL — ABNORMAL LOW (ref 6.5–8.1)

## 2021-04-11 NOTE — Progress Notes (Signed)

## 2021-04-12 NOTE — Progress Notes (Signed)
Lab results reviewed with Dr. Doroteo Glassman, will proceed with surgery as scheduled.

## 2021-04-14 ENCOUNTER — Ambulatory Visit (HOSPITAL_BASED_OUTPATIENT_CLINIC_OR_DEPARTMENT_OTHER): Admitting: Certified Registered"

## 2021-04-14 ENCOUNTER — Ambulatory Visit (HOSPITAL_BASED_OUTPATIENT_CLINIC_OR_DEPARTMENT_OTHER)
Admission: RE | Admit: 2021-04-14 | Discharge: 2021-04-14 | Disposition: A | Discharge status: Home / Self Care | Source: Ambulatory Visit | Attending: Orthopedic Surgery | Admitting: Orthopedic Surgery

## 2021-04-14 ENCOUNTER — Other Ambulatory Visit: Payer: Self-pay

## 2021-04-14 ENCOUNTER — Encounter (HOSPITAL_BASED_OUTPATIENT_CLINIC_OR_DEPARTMENT_OTHER)
Admission: RE | Disposition: A | Discharge status: Home / Self Care | Payer: Self-pay | Source: Ambulatory Visit | Attending: Orthopedic Surgery

## 2021-04-14 ENCOUNTER — Encounter (HOSPITAL_BASED_OUTPATIENT_CLINIC_OR_DEPARTMENT_OTHER): Payer: Self-pay | Admitting: Orthopedic Surgery

## 2021-04-14 DIAGNOSIS — Z79899 Other long term (current) drug therapy: Secondary | ICD-10-CM | POA: Diagnosis not present

## 2021-04-14 DIAGNOSIS — I129 Hypertensive chronic kidney disease with stage 1 through stage 4 chronic kidney disease, or unspecified chronic kidney disease: Secondary | ICD-10-CM | POA: Diagnosis not present

## 2021-04-14 DIAGNOSIS — Z6841 Body Mass Index (BMI) 40.0 and over, adult: Secondary | ICD-10-CM | POA: Diagnosis not present

## 2021-04-14 DIAGNOSIS — Z87891 Personal history of nicotine dependence: Secondary | ICD-10-CM | POA: Insufficient documentation

## 2021-04-14 DIAGNOSIS — N189 Chronic kidney disease, unspecified: Secondary | ICD-10-CM | POA: Diagnosis not present

## 2021-04-14 DIAGNOSIS — M899 Disorder of bone, unspecified: Secondary | ICD-10-CM | POA: Diagnosis not present

## 2021-04-14 DIAGNOSIS — M7661 Achilles tendinitis, right leg: Secondary | ICD-10-CM | POA: Diagnosis not present

## 2021-04-14 DIAGNOSIS — M6701 Short Achilles tendon (acquired), right ankle: Secondary | ICD-10-CM | POA: Insufficient documentation

## 2021-04-14 DIAGNOSIS — N289 Disorder of kidney and ureter, unspecified: Secondary | ICD-10-CM

## 2021-04-14 HISTORY — PX: GASTROCNEMIUS RECESSION: SHX863

## 2021-04-14 HISTORY — DX: Polycystic kidney, unspecified: Q61.3

## 2021-04-14 HISTORY — PX: 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: SHX5627

## 2021-04-14 SURGERY — RECESSION, MUSCLE, GASTROCNEMIUS
Anesthesia: General | Site: Foot | Laterality: Right

## 2021-04-14 MED ORDER — SODIUM CHLORIDE 0.9 % IV SOLN: INTRAVENOUS | Status: DC

## 2021-04-14 MED ORDER — EPHEDRINE 5 MG/ML INJ
INTRAVENOUS | Status: AC
  Filled 2021-04-14: qty 5

## 2021-04-14 MED ORDER — LIDOCAINE HCL (CARDIAC) PF 100 MG/5ML IV SOSY
PREFILLED_SYRINGE | INTRAVENOUS | Status: DC | PRN
  Administered 2021-04-14: 30 mg via INTRAVENOUS

## 2021-04-14 MED ORDER — OXYCODONE HCL 5 MG PO TABS: 5.0000 mg | ORAL_TABLET | Freq: Once | ORAL | Status: DC | PRN

## 2021-04-14 MED ORDER — ROCURONIUM BROMIDE 100 MG/10ML IV SOLN
INTRAVENOUS | Status: DC | PRN
  Administered 2021-04-14: 80 mg via INTRAVENOUS

## 2021-04-14 MED ORDER — AMISULPRIDE (ANTIEMETIC) 5 MG/2ML IV SOLN: 10.0000 mg | Freq: Once | INTRAVENOUS | Status: DC | PRN

## 2021-04-14 MED ORDER — 0.9 % SODIUM CHLORIDE (POUR BTL) OPTIME
TOPICAL | Status: DC | PRN
  Administered 2021-04-14: 250 mL

## 2021-04-14 MED ORDER — VANCOMYCIN HCL 500 MG IV SOLR
INTRAVENOUS | Status: DC | PRN
  Administered 2021-04-14: 500 mg via TOPICAL

## 2021-04-14 MED ORDER — PROPOFOL 10 MG/ML IV BOLUS
INTRAVENOUS | Status: DC | PRN
  Administered 2021-04-14: 200 mg via INTRAVENOUS

## 2021-04-14 MED ORDER — OXYCODONE HCL 5 MG/5ML PO SOLN: 5.0000 mg | Freq: Once | ORAL | Status: DC | PRN

## 2021-04-14 MED ORDER — LACTATED RINGERS IV SOLN: INTRAVENOUS | Status: DC

## 2021-04-14 MED ORDER — CEFAZOLIN IN SODIUM CHLORIDE 3-0.9 GM/100ML-% IV SOLN
INTRAVENOUS | Status: AC
  Filled 2021-04-14: qty 100

## 2021-04-14 MED ORDER — SENNA 8.6 MG PO TABS: 2.0000 | ORAL_TABLET | Freq: Two times a day (BID) | ORAL | 0 refills | Status: DC

## 2021-04-14 MED ORDER — ONDANSETRON HCL 4 MG/2ML IJ SOLN
INTRAMUSCULAR | Status: AC
  Filled 2021-04-14: qty 8

## 2021-04-14 MED ORDER — OXYCODONE HCL 5 MG PO TABS: 5.0000 mg | ORAL_TABLET | ORAL | 0 refills | Status: AC | PRN

## 2021-04-14 MED ORDER — SUGAMMADEX SODIUM 200 MG/2ML IV SOLN
INTRAVENOUS | Status: DC | PRN
  Administered 2021-04-14: 200 mg via INTRAVENOUS

## 2021-04-14 MED ORDER — FENTANYL CITRATE (PF) 100 MCG/2ML IJ SOLN
INTRAMUSCULAR | Status: AC
  Filled 2021-04-14: qty 2

## 2021-04-14 MED ORDER — ROPIVACAINE HCL 5 MG/ML IJ SOLN
INTRAMUSCULAR | Status: DC | PRN
  Administered 2021-04-14: 30 mL via PERINEURAL

## 2021-04-14 MED ORDER — LIDOCAINE 2% (20 MG/ML) 5 ML SYRINGE
INTRAMUSCULAR | Status: AC
  Filled 2021-04-14: qty 10

## 2021-04-14 MED ORDER — VANCOMYCIN HCL 500 MG IV SOLR
INTRAVENOUS | Status: AC
  Filled 2021-04-14: qty 10

## 2021-04-14 MED ORDER — DEXMEDETOMIDINE (PRECEDEX) IN NS 20 MCG/5ML (4 MCG/ML) IV SYRINGE
PREFILLED_SYRINGE | INTRAVENOUS | Status: DC | PRN
  Administered 2021-04-14: 12 ug via INTRAVENOUS

## 2021-04-14 MED ORDER — EPHEDRINE SULFATE 50 MG/ML IJ SOLN
INTRAMUSCULAR | Status: DC | PRN
  Administered 2021-04-14 (×2): 10 mg via INTRAVENOUS

## 2021-04-14 MED ORDER — PHENYLEPHRINE HCL (PRESSORS) 10 MG/ML IV SOLN
INTRAVENOUS | Status: DC | PRN
  Administered 2021-04-14 (×2): 40 ug via INTRAVENOUS

## 2021-04-14 MED ORDER — MIDAZOLAM HCL 2 MG/2ML IJ SOLN
2.0000 mg | Freq: Once | INTRAMUSCULAR | Status: AC
  Administered 2021-04-14: 2 mg via INTRAVENOUS

## 2021-04-14 MED ORDER — FENTANYL CITRATE (PF) 100 MCG/2ML IJ SOLN
INTRAMUSCULAR | Status: DC | PRN
  Administered 2021-04-14: 50 ug via INTRAVENOUS

## 2021-04-14 MED ORDER — PROMETHAZINE HCL 25 MG/ML IJ SOLN: 6.2500 mg | INTRAMUSCULAR | Status: DC | PRN

## 2021-04-14 MED ORDER — DEXAMETHASONE SODIUM PHOSPHATE 4 MG/ML IJ SOLN
INTRAMUSCULAR | Status: DC | PRN
  Administered 2021-04-14: 4 mg via INTRAVENOUS

## 2021-04-14 MED ORDER — CEFAZOLIN IN SODIUM CHLORIDE 3-0.9 GM/100ML-% IV SOLN
3.0000 g | INTRAVENOUS | Status: AC
  Administered 2021-04-14: 3 g via INTRAVENOUS

## 2021-04-14 MED ORDER — DEXAMETHASONE SODIUM PHOSPHATE 10 MG/ML IJ SOLN
INTRAMUSCULAR | Status: AC
  Filled 2021-04-14: qty 2

## 2021-04-14 MED ORDER — BUPIVACAINE-EPINEPHRINE (PF) 0.5% -1:200000 IJ SOLN
INTRAMUSCULAR | Status: AC
  Filled 2021-04-14: qty 30

## 2021-04-14 MED ORDER — DOCUSATE SODIUM 100 MG PO CAPS: 100.0000 mg | ORAL_CAPSULE | Freq: Two times a day (BID) | ORAL | 0 refills | Status: DC

## 2021-04-14 MED ORDER — DEXMEDETOMIDINE (PRECEDEX) IN NS 20 MCG/5ML (4 MCG/ML) IV SYRINGE
PREFILLED_SYRINGE | INTRAVENOUS | Status: AC
  Filled 2021-04-14: qty 5

## 2021-04-14 MED ORDER — FENTANYL CITRATE (PF) 100 MCG/2ML IJ SOLN
100.0000 ug | Freq: Once | INTRAMUSCULAR | Status: AC
  Administered 2021-04-14: 100 ug via INTRAVENOUS

## 2021-04-14 MED ORDER — HYDROMORPHONE HCL 1 MG/ML IJ SOLN: 0.2500 mg | INTRAMUSCULAR | Status: DC | PRN

## 2021-04-14 MED ORDER — RIVAROXABAN 10 MG PO TABS: 10.0000 mg | ORAL_TABLET | Freq: Every day | ORAL | 0 refills | Status: DC

## 2021-04-14 MED ORDER — PROPOFOL 500 MG/50ML IV EMUL
INTRAVENOUS | Status: AC
  Filled 2021-04-14: qty 100

## 2021-04-14 MED ORDER — PHENYLEPHRINE 40 MCG/ML (10ML) SYRINGE FOR IV PUSH (FOR BLOOD PRESSURE SUPPORT)
PREFILLED_SYRINGE | INTRAVENOUS | Status: AC
  Filled 2021-04-14: qty 10

## 2021-04-14 MED ORDER — ONDANSETRON HCL 4 MG/2ML IJ SOLN
INTRAMUSCULAR | Status: DC | PRN
  Administered 2021-04-14: 4 mg via INTRAVENOUS

## 2021-04-14 MED ORDER — MIDAZOLAM HCL 2 MG/2ML IJ SOLN
INTRAMUSCULAR | Status: AC
  Filled 2021-04-14: qty 2

## 2021-04-14 SURGICAL SUPPLY — 88 items
ANCH SUT 2 2.9 2 LD TPR NDL (Anchor) ×2 IMPLANT
ANCH SUT 4.75 LNK KNTLS STRL (Anchor) ×2 IMPLANT
ANCHOR JUGGERKNOT WTAP NDL 2.9 (Anchor) ×2 IMPLANT
ANCHOR KNTLS VENTIX 4.75 (Anchor) ×2 IMPLANT
APL PRP STRL LF DISP 70% ISPRP (MISCELLANEOUS) ×1
BANDAGE ESMARK 6X9 LF (GAUZE/BANDAGES/DRESSINGS) ×1 IMPLANT
BIT DRILL JUGRKNT W/NDL BIT2.9 (DRILL) IMPLANT
BLADE AVERAGE 25X9 (BLADE) IMPLANT
BLADE MICRO SAGITTAL (BLADE) ×2 IMPLANT
BLADE SURG 15 STRL LF DISP TIS (BLADE) ×2 IMPLANT
BLADE SURG 15 STRL SS (BLADE) ×4
BNDG CMPR 9X6 STRL LF SNTH (GAUZE/BANDAGES/DRESSINGS) ×2
BNDG COHESIVE 4X5 TAN ST LF (GAUZE/BANDAGES/DRESSINGS) ×1 IMPLANT
BNDG COHESIVE 6X5 TAN ST LF (GAUZE/BANDAGES/DRESSINGS) ×1 IMPLANT
BNDG ELASTIC 4X5.8 VLCR STR LF (GAUZE/BANDAGES/DRESSINGS) ×1 IMPLANT
BNDG ELASTIC 6X5.8 VLCR STR LF (GAUZE/BANDAGES/DRESSINGS) ×1 IMPLANT
BNDG ESMARK 6X9 LF (GAUZE/BANDAGES/DRESSINGS) ×4
BOOT STEPPER DURA LG (SOFTGOODS) IMPLANT
BOOT STEPPER DURA MED (SOFTGOODS) IMPLANT
BOOT STEPPER DURA XLG (SOFTGOODS) IMPLANT
CANISTER SUCT 1200ML W/VALVE (MISCELLANEOUS) ×1 IMPLANT
CHLORAPREP W/TINT 26 (MISCELLANEOUS) ×2 IMPLANT
COVER BACK TABLE 60X90IN (DRAPES) ×2 IMPLANT
CUFF TOURN SGL QUICK 34 (TOURNIQUET CUFF)
CUFF TRNQT CYL 34X4.125X (TOURNIQUET CUFF) IMPLANT
DRAPE EXTREMITY T 121X128X90 (DISPOSABLE) ×2 IMPLANT
DRAPE OEC MINIVIEW 54X84 (DRAPES) IMPLANT
DRAPE U-SHAPE 47X51 STRL (DRAPES) ×2 IMPLANT
DRILL JUGGERKNOT W/NDL BIT 2.9 (DRILL) ×2
DRSG MEPITEL 4X7.2 (GAUZE/BANDAGES/DRESSINGS) ×2 IMPLANT
DRSG PAD ABDOMINAL 8X10 ST (GAUZE/BANDAGES/DRESSINGS) ×4 IMPLANT
ELECT REM PT RETURN 9FT ADLT (ELECTROSURGICAL) ×2
ELECTRODE REM PT RTRN 9FT ADLT (ELECTROSURGICAL) ×1 IMPLANT
GAUZE SPONGE 4X4 12PLY STRL (GAUZE/BANDAGES/DRESSINGS) ×2 IMPLANT
GLOVE SRG 8 PF TXTR STRL LF DI (GLOVE) ×2 IMPLANT
GLOVE SURG ENC MOIS LTX SZ8 (GLOVE) ×2 IMPLANT
GLOVE SURG LTX SZ8 (GLOVE) ×2 IMPLANT
GLOVE SURG POLYISO LF SZ7 (GLOVE) ×1 IMPLANT
GLOVE SURG UNDER POLY LF SZ7 (GLOVE) ×2 IMPLANT
GLOVE SURG UNDER POLY LF SZ8 (GLOVE) ×4
GOWN STRL REUS W/ TWL LRG LVL3 (GOWN DISPOSABLE) ×1 IMPLANT
GOWN STRL REUS W/ TWL XL LVL3 (GOWN DISPOSABLE) ×2 IMPLANT
GOWN STRL REUS W/TWL LRG LVL3 (GOWN DISPOSABLE) ×2
GOWN STRL REUS W/TWL XL LVL3 (GOWN DISPOSABLE) ×4
NDL HYPO 25X1 1.5 SAFETY (NEEDLE) IMPLANT
NDL SAFETY ECLIPSE 18X1.5 (NEEDLE) IMPLANT
NDL SUT 6 .5 CRC .975X.05 MAYO (NEEDLE) IMPLANT
NEEDLE HYPO 18GX1.5 SHARP (NEEDLE)
NEEDLE HYPO 22GX1.5 SAFETY (NEEDLE) IMPLANT
NEEDLE HYPO 25X1 1.5 SAFETY (NEEDLE) IMPLANT
NEEDLE MAYO TAPER (NEEDLE)
NS IRRIG 1000ML POUR BTL (IV SOLUTION) ×2 IMPLANT
PACK BASIN DAY SURGERY FS (CUSTOM PROCEDURE TRAY) ×2 IMPLANT
PAD CAST 4YDX4 CTTN HI CHSV (CAST SUPPLIES) ×1 IMPLANT
PADDING CAST COTTON 4X4 STRL (CAST SUPPLIES) ×2
PADDING CAST COTTON 6X4 STRL (CAST SUPPLIES) ×2 IMPLANT
PENCIL SMOKE EVACUATOR (MISCELLANEOUS) ×2 IMPLANT
SANITIZER HAND PURELL 535ML FO (MISCELLANEOUS) ×2 IMPLANT
SHEET MEDIUM DRAPE 40X70 STRL (DRAPES) ×2 IMPLANT
SLEEVE SCD COMPRESS KNEE MED (STOCKING) ×2 IMPLANT
SPLINT FAST PLASTER 5X30 (CAST SUPPLIES) ×20
SPLINT PLASTER CAST FAST 5X30 (CAST SUPPLIES) ×20 IMPLANT
SPONGE T-LAP 18X18 ~~LOC~~+RFID (SPONGE) ×2 IMPLANT
STAPLER VISISTAT 35W (STAPLE) IMPLANT
STOCKINETTE 6  STRL (DRAPES) ×2
STOCKINETTE 6 STRL (DRAPES) ×1 IMPLANT
SUCTION FRAZIER HANDLE 10FR (MISCELLANEOUS) ×2
SUCTION TUBE FRAZIER 10FR DISP (MISCELLANEOUS) ×1 IMPLANT
SUT ETHIBOND 2 OS 4 DA (SUTURE) IMPLANT
SUT ETHIBOND 3-0 V-5 (SUTURE) IMPLANT
SUT ETHILON 3 0 PS 1 (SUTURE) ×3 IMPLANT
SUT FIBERWIRE #2 38 T-5 BLUE (SUTURE)
SUT MNCRL AB 3-0 PS2 18 (SUTURE) ×2 IMPLANT
SUT VIC AB 0 CT1 27 (SUTURE)
SUT VIC AB 0 CT1 27XBRD ANBCTR (SUTURE) IMPLANT
SUT VIC AB 1 CT1 27 (SUTURE)
SUT VIC AB 1 CT1 27XBRD ANBCTR (SUTURE) IMPLANT
SUT VIC AB 2-0 SH 18 (SUTURE) IMPLANT
SUT VIC AB 2-0 SH 27 (SUTURE) ×2
SUT VIC AB 2-0 SH 27XBRD (SUTURE) IMPLANT
SUT VICRYL 0 SH 27 (SUTURE) ×2 IMPLANT
SUTURE FIBERWR #2 38 T-5 BLUE (SUTURE) IMPLANT
SYR BULB EAR ULCER 3OZ GRN STR (SYRINGE) ×2 IMPLANT
SYR CONTROL 10ML LL (SYRINGE) IMPLANT
TOWEL GREEN STERILE FF (TOWEL DISPOSABLE) ×4 IMPLANT
TUBE CONNECTING 20X1/4 (TUBING) ×2 IMPLANT
UNDERPAD 30X36 HEAVY ABSORB (UNDERPADS AND DIAPERS) ×2 IMPLANT
YANKAUER SUCT BULB TIP NO VENT (SUCTIONS) IMPLANT

## 2021-04-14 NOTE — H&P (Signed)
Kevin Todd is an 45 y.o. male.   Chief Complaint: right ankle pain HPI: 45 y/o male with past medical history significant for smoking.  He injured his right ankle at work and has chronic Achilles tendinitis.  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  He has failed nonoperative treatment including activity modification, oral anti-inflammatories and physical therapy.  He presents now for gastroc recession, Achilles reconstruction and excision of the Haglund deformity.  He has stopped smoking.  Past Medical History:  Diagnosis Date   Chronic kidney disease    Elevated creatine kinase 09/2019   Hypertension    Iliac crest spur of left hip    Insomnia 10/2019   Pneumothorax on right    Polycystic kidney disease    Vitamin D deficiency 09/2019    History reviewed. No pertinent surgical history.  Family History  Problem Relation Age of Onset   Hypertension Mother    Colon cancer Maternal Grandfather    Stomach cancer Neg Hx    Esophageal cancer Neg Hx    Pancreatic cancer Neg Hx    Social History:  reports that he has quit smoking. His smoking use included cigarettes. He smoked an average of .25 packs per day. He has never used smokeless tobacco. He reports that he does not currently use alcohol. He reports that he does not use drugs.  Allergies: No Known Allergies  Medications Prior to Admission  Medication Sig Dispense Refill   amLODipine (NORVASC) 10 MG tablet Take 1 tablet (10 mg total) by mouth daily. 30 tablet 1   hydrALAZINE (APRESOLINE) 50 MG tablet Take 50 mg by mouth 3 (three) times daily.     meloxicam (MOBIC) 15 MG tablet Take 1 tablet (15 mg total) by mouth daily. (alternate with Acetaminophen) 30 tablet 3   metoprolol succinate (TOPROL-XL) 50 MG 24 hr tablet Take 50 mg by mouth daily.     Tolvaptan 30 MG TABS Take by mouth.     traZODone (DESYREL) 100 MG tablet TAKE 1 TABLET (100 MG TOTAL) BY MOUTH AT BEDTIME AS NEEDED FOR SLEEP. 30 tablet 6    Vitamin D, Ergocalciferol, (DRISDOL) 1.25 MG (50000 UNIT) CAPS capsule TAKE 1 CAPSULE (50,000 UNITS TOTAL) BY MOUTH EVERY 7 (SEVEN) DAYS. 5 capsule 6   acetaminophen (TYLENOL) 500 MG tablet Take 2 tablets (1,000 mg total) by mouth in the morning and at bedtime. 120 tablet 6   HYDROcodone-acetaminophen (NORCO/VICODIN) 5-325 MG tablet Take 1 tablet by mouth every 6 (six) hours as needed for moderate pain or severe pain. 12 tablet 0    No results found for this or any previous visit (from the past 48 hour(s)). No results found.  Review of Systems no recent fever, chills, nausea, vomiting or changes in his appetite  Blood pressure (!) 142/70, pulse 79, temperature (!) 97.5 F (36.4 C), temperature source Oral, resp. rate (!) 9, height 5\' 8"  (1.727 m), weight (!) 146 kg, SpO2 99 %. Physical Exam  Well-nourished well-developed man in no apparent distress.  Alert and oriented x4.  Normal mood and affect.  Gait is antalgic to the right.  The right heel is tender to palpation at the insertion of the Achilles.  Skin is healthy and intact.  5 out of 5 strength in plantarflexion.  Heel cord is tight.  No lymphadenopathy.  Pulses are palpable in the foot.   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  The risks and benefits of the alternative treatment options have been discussed in detail.  The patient wishes to proceed with surgery and specifically understands risks of bleeding, infection, nerve damage, blood clots, need for additional surgery, amputation and death.   Wylene Simmer, MD 05-06-2021, 8:37 AM

## 2021-04-14 NOTE — Op Note (Signed)
04/14/2021  10:32 AM  PATIENT:  Kevin Todd  45 y.o. male  PRE-OPERATIVE DIAGNOSIS:  1.  acquired short right Achilles tendon 2.  Right posterior calcaneal exostosis (Haglund deformity) 3.  Right achilles tendonopathy  POST-OPERATIVE DIAGNOSIS:  same  Procedure(s):  Right gastroc recession Excision of right calcaneus Haglund deformity Debridement and reconstruction of right achilles tendon  SURGEON:  Wylene Simmer, MD  ASSISTANT: Mechele Claude, PA-C  ANESTHESIA:   General, regional  EBL:  minimal   TOURNIQUET:   Total Tourniquet Time Documented: Thigh (Right) - 44 minutes Total: Thigh (Right) - 44 minutes  COMPLICATIONS:  None apparent  DISPOSITION:  Extubated, awake and stable to recovery.  INDICATION FOR PROCEDURE:  The patient is a 45 y/o male with a PMH of CKD.  He has a long history of right heel pain due to a work injury.  He has failed non op treatment and presents today for surgery.  The risks and benefits of the alternative treatment options have been discussed in detail.  The patient wishes to proceed with surgery and specifically understands risks of bleeding, infection, nerve damage, blood clots, need for additional surgery, amputation and death.   PROCEDURE IN DETAIL: After preoperative consent was obtained and the correct operative site was identified, the patient was brought the operating room supine on a stretcher.  General anesthesia was induced.  Preoperative antibiotics were administered.  Surgical timeout was taken.  Patient was then turned into the prone position on the operating table with all bony prominences padded well.  The right lower extremity was prepped and draped in standard sterile fashion with a tourniquet around the thigh.  The extremity was exsanguinated and the tourniquet was inflated to 350 mmHg.  A longitudinal incision was made over the distal calf.  Dissection was carried sharply down through the subcutaneous tissues.  Care was taken to  protect the sural nerve and lesser saphenous vein.  The superficial fascia was incised.  The gastrocnemius tendon was identified.  Tendon was divided in its entirety under direct vision.  The wound was irrigated copiously and sprinkled with vancomycin powder.  It was closed with Monocryl and nylon.  Attention was turned to the posterior heel where a longitudinal incision was made in the midline.  Dissection was carried sharply down through the subcutaneous tissues and down through the peritenon creating full-thickness flaps medially and laterally.  Bursitis was excised superficial to the tendon.  The tendon was split in the midline and released from its insertion on the calcaneus medially and laterally.  The tendon was debrided of all degenerated fibers.  The tendon was then protected along with the skin edges.  An oscillating saw was used to resect the insertional enthesophyte and Haglund deformity.  The wound was irrigated.  The Achilles tendon was repaired back to the cut surface of bone with 2 juggernaut anchors and 2 ventiX anchors and an hourglass pattern of nonabsorbable suture.  The ankle could be dorsiflexed to neutral with no gapping evident at the tendon repair site.  Wound was irrigated copiously and sprinkled with vancomycin powder.  Deep subcutaneous tissues and peritenon were repaired with inverted simple sutures of 2-0 Vicryl.  Skin incision was closed with horizontal mattress sutures of 3-0 nylon.  Sterile dressings were applied followed by a well-padded short leg splint with the ankle in gravity equinus.  Tourniquet was released after application of the dressings.  The patient was awakened from anesthesia and transported to the recovery room in stable condition.  FOLLOW UP PLAN:  NWB on the R LE.  F/u in two weeks for suture removal and WB in cam boot with two heel lifts.  Xarelto for DVT prophylaxis due to the patient's h/o obesity.    Mechele Claude PA-C was present and scrubbed for the  duration of the operative case. His assistance was essential in positioning the patient, prepping and draping, gaining and maintaining exposure, performing the operation, closing and dressing the wounds and applying the splint.

## 2021-04-14 NOTE — Progress Notes (Signed)
Assisted Dr. Sabra Heck with right, ultrasound guided, popliteal block. Side rails up, monitors on throughout procedure. See vital signs in flow sheet. Tolerated Procedure well.

## 2021-04-14 NOTE — Anesthesia Procedure Notes (Signed)
Procedure Name: Intubation Date/Time: 04/14/2021 9:01 AM Performed by: Signe Colt, CRNA Pre-anesthesia Checklist: Patient identified, Emergency Drugs available, Suction available and Patient being monitored Patient Re-evaluated:Patient Re-evaluated prior to induction Oxygen Delivery Method: Circle system utilized Preoxygenation: Pre-oxygenation with 100% oxygen Induction Type: IV induction Ventilation: Mask ventilation without difficulty Laryngoscope Size: Mac and 3 Grade View: Grade III Tube type: Oral Tube size: 7.5 mm Number of attempts: 1 Airway Equipment and Method: Stylet and Oral airway Placement Confirmation: ETT inserted through vocal cords under direct vision, positive ETCO2 and breath sounds checked- equal and bilateral Secured at: 22 cm Tube secured with: Tape Dental Injury: Teeth and Oropharynx as per pre-operative assessment  Difficulty Due To: Difficulty was anticipated

## 2021-04-14 NOTE — Discharge Instructions (Addendum)
Kevin Hewitt, MD [facility]  Please read the following information regarding your care after surgery.  Medications  You only need a prescription for the narcotic pain medicine (ex. oxycodone, Percocet, Norco).  All of the other medicines listed below are available over the counter. ? Aleve 2 pills twice a day for the first 3 days after surgery. ? acetominophen (Tylenol) 650 mg every 4-6 hours as you need for minor to moderate pain ? oxycodone as prescribed for severe pain  Narcotic pain medicine (ex. oxycodone, Percocet, Vicodin) will cause constipation.  To prevent this problem, take the following medicines while you are taking any pain medicine. ? docusate sodium (Colace) 100 mg twice a day ? senna (Senokot) 2 tablets twice a day  ? To help prevent blood clots, take Xarelto as prescribed for two weeks after surgery.  You should also get up every hour while you are awake to move around.    Weight Bearing ? Do not bear any weight on the operated leg or foot.  Cast / Splint / Dressing ? Keep your splint, cast or dressing clean and dry.  Don't put anything (coat hanger, pencil, etc) down inside of it.  If it gets damp, use a hair dryer on the cool setting to dry it.  If it gets soaked, call the office to schedule an appointment for a cast change.   After your dressing, cast or splint is removed; you may shower, but do not soak or scrub the wound.  Allow the water to run over it, and then gently pat it dry.  Swelling It is normal for you to have swelling where you had surgery.  To reduce swelling and pain, keep your toes above your nose for at least 3 days after surgery.  It may be necessary to keep your foot or leg elevated for several weeks.  If it hurts, it should be elevated.  Follow Up Call my office at [phone] when you are discharged from the hospital or surgery center to schedule an appointment to be seen two weeks after surgery.  Call my office at [phone] if you develop  a fever >101.5 F, nausea, vomiting, bleeding from the surgical site or severe pain.       Post Anesthesia Home Care Instructions  Activity: Get plenty of rest for the remainder of the day. A responsible individual must stay with you for 24 hours following the procedure.  For the next 24 hours, DO NOT: -Drive a car -Operate machinery -Drink alcoholic beverages -Take any medication unless instructed by your physician -Make any legal decisions or sign important papers.  Meals: Start with liquid foods such as gelatin or soup. Progress to regular foods as tolerated. Avoid greasy, spicy, heavy foods. If nausea and/or vomiting occur, drink only clear liquids until the nausea and/or vomiting subsides. Call your physician if vomiting continues.  Special Instructions/Symptoms: Your throat may feel dry or sore from the anesthesia or the breathing tube placed in your throat during surgery. If this causes discomfort, gargle with warm salt water. The discomfort should disappear within 24 hours.  If you had a scopolamine patch placed behind your ear for the management of post- operative nausea and/or vomiting:  1. The medication in the patch is effective for 72 hours, after which it should be removed.  Wrap patch in a tissue and discard in the trash. Wash hands thoroughly with soap and water. 2. You may remove the patch earlier than 72 hours if you experience unpleasant side effects   which may include dry mouth, dizziness or visual disturbances. 3. Avoid touching the patch. Wash your hands with soap and water after contact with the patch.    Regional Anesthesia Blocks  1. Numbness or the inability to move the "blocked" extremity may last from 3-48 hours after placement. The length of time depends on the medication injected and your individual response to the medication. If the numbness is not going away after 48 hours, call your surgeon.  2. The extremity that is blocked will need to be protected  until the numbness is gone and the  Strength has returned. Because you cannot feel it, you will need to take extra care to avoid injury. Because it may be weak, you may have difficulty moving it or using it. You may not know what position it is in without looking at it while the block is in effect.  3. For blocks in the legs and feet, returning to weight bearing and walking needs to be done carefully. You will need to wait until the numbness is entirely gone and the strength has returned. You should be able to move your leg and foot normally before you try and bear weight or walk. You will need someone to be with you when you first try to ensure you do not fall and possibly risk injury.  4. Bruising and tenderness at the needle site are common side effects and will resolve in a few days.  5. Persistent numbness or new problems with movement should be communicated to the surgeon or the  Surgery Center ([phone])/ [facility] ([phone]). 

## 2021-04-14 NOTE — Anesthesia Postprocedure Evaluation (Signed)
Anesthesia Post Note  Patient: Kevin Todd  Procedure(s) Performed: Right gastroc recession (Right: Foot) Achilles tendon debridement and reconstruction, excision of Haglund (Right: Foot)     Patient location during evaluation: PACU Anesthesia Type: General Level of consciousness: awake and alert Pain management: pain level controlled Vital Signs Assessment: post-procedure vital signs reviewed and stable Respiratory status: spontaneous breathing, nonlabored ventilation and respiratory function stable Cardiovascular status: blood pressure returned to baseline and stable Postop Assessment: no apparent nausea or vomiting Anesthetic complications: no   No notable events documented.  Last Vitals:  Vitals:   04/14/21 1115 04/14/21 1130  BP: (!) 151/94 140/85  Pulse: 80 78  Resp: (!) 7 16  Temp:    SpO2: 93% 95%    Last Pain:  Vitals:   04/14/21 1130  TempSrc:   PainSc: 0-No pain                 Lynda Rainwater

## 2021-04-14 NOTE — Anesthesia Procedure Notes (Signed)
Anesthesia Regional Block: Popliteal block   Pre-Anesthetic Checklist: , timeout performed,  Correct Patient, Correct Site, Correct Laterality,  Correct Procedure, Correct Position, site marked,  Risks and benefits discussed,  Surgical consent,  Pre-op evaluation,  At surgeon's request and post-op pain management  Laterality: Right  Prep: chloraprep       Needles:  Injection technique: Single-shot  Needle Type: Stimiplex     Needle Length: 9cm  Needle Gauge: 21     Additional Needles:   Procedures:,,,, ultrasound used (permanent image in chart),,    Narrative:  Start time: 04/14/2021 8:11 AM End time: 04/14/2021 8:16 AM Injection made incrementally with aspirations every 5 mL.  Performed by: Personally  Anesthesiologist: Lynda Rainwater, MD

## 2021-04-14 NOTE — Transfer of Care (Signed)
Immediate Anesthesia Transfer of Care Note  Patient: Kevin Todd  Procedure(s) Performed: Right gastroc recession (Right: Foot) Achilles tendon debridement and reconstruction, excision of Haglund (Right: Foot)  Patient Location: PACU  Anesthesia Type:GA combined with regional for post-op pain  Level of Consciousness: drowsy and patient cooperative  Airway & Oxygen Therapy: Patient Spontanous Breathing and Patient connected to face mask oxygen  Post-op Assessment: Report given to RN and Post -op Vital signs reviewed and stable  Post vital signs: Reviewed and stable  Last Vitals:  Vitals Value Taken Time  BP 124/72 04/14/21 1028  Temp    Pulse 85 04/14/21 1030  Resp 5 04/14/21 1030  SpO2 95 % 04/14/21 1030  Vitals shown include unvalidated device data.  Last Pain:  Vitals:   04/14/21 0747  TempSrc: Oral  PainSc: 0-No pain      Patients Stated Pain Goal: 3 (50/51/83 3582)  Complications: No notable events documented.

## 2021-04-14 NOTE — Anesthesia Preprocedure Evaluation (Signed)
Anesthesia Evaluation  Patient identified by MRN, date of birth, ID band Patient awake    Reviewed: Allergy & Precautions, H&P , NPO status , Patient's Chart, lab work & pertinent test results  Airway Mallampati: II  TM Distance: >3 FB Neck ROM: Full    Dental no notable dental hx.    Pulmonary neg pulmonary ROS, former smoker,    Pulmonary exam normal breath sounds clear to auscultation       Cardiovascular hypertension, Pt. on medications negative cardio ROS Normal cardiovascular exam Rhythm:Regular Rate:Normal     Neuro/Psych negative neurological ROS  negative psych ROS   GI/Hepatic negative GI ROS, Neg liver ROS,   Endo/Other  Morbid obesity  Renal/GU Renal InsufficiencyRenal disease  negative genitourinary   Musculoskeletal negative musculoskeletal ROS (+)   Abdominal (+) + obese,   Peds negative pediatric ROS (+)  Hematology negative hematology ROS (+)   Anesthesia Other Findings   Reproductive/Obstetrics negative OB ROS                             Anesthesia Physical Anesthesia Plan  ASA: 3  Anesthesia Plan: General   Post-op Pain Management: Regional block   Induction: Intravenous  PONV Risk Score and Plan: 2 and Ondansetron, Midazolam and Treatment may vary due to age or medical condition  Airway Management Planned: Oral ETT  Additional Equipment:   Intra-op Plan:   Post-operative Plan: Extubation in OR  Informed Consent: I have reviewed the patients History and Physical, chart, labs and discussed the procedure including the risks, benefits and alternatives for the proposed anesthesia with the patient or authorized representative who has indicated his/her understanding and acceptance.     Dental advisory given  Plan Discussed with: CRNA  Anesthesia Plan Comments:         Anesthesia Quick Evaluation

## 2021-04-15 ENCOUNTER — Other Ambulatory Visit: Payer: Self-pay | Admitting: Nurse Practitioner

## 2021-04-15 DIAGNOSIS — G4700 Insomnia, unspecified: Secondary | ICD-10-CM

## 2021-04-16 ENCOUNTER — Encounter (HOSPITAL_BASED_OUTPATIENT_CLINIC_OR_DEPARTMENT_OTHER): Payer: Self-pay | Admitting: Orthopedic Surgery

## 2021-05-16 ENCOUNTER — Other Ambulatory Visit: Payer: Self-pay | Admitting: Nurse Practitioner

## 2021-05-16 DIAGNOSIS — G4700 Insomnia, unspecified: Secondary | ICD-10-CM

## 2021-12-09 ENCOUNTER — Encounter (HOSPITAL_COMMUNITY): Payer: Self-pay | Admitting: Emergency Medicine

## 2021-12-09 ENCOUNTER — Inpatient Hospital Stay (HOSPITAL_COMMUNITY)
Admission: EM | Admit: 2021-12-09 | Discharge: 2021-12-13 | DRG: 638 | Disposition: A | Discharge status: Home / Self Care | Payer: Commercial Managed Care - HMO | Source: Ambulatory Visit | Attending: Internal Medicine | Admitting: Internal Medicine

## 2021-12-09 ENCOUNTER — Other Ambulatory Visit: Payer: Self-pay

## 2021-12-09 ENCOUNTER — Ambulatory Visit (HOSPITAL_COMMUNITY)
Admission: EM | Admit: 2021-12-09 | Discharge: 2021-12-09 | Disposition: A | Discharge status: Home / Self Care | Payer: Commercial Managed Care - HMO

## 2021-12-09 DIAGNOSIS — N184 Chronic kidney disease, stage 4 (severe): Secondary | ICD-10-CM | POA: Diagnosis present

## 2021-12-09 DIAGNOSIS — E1122 Type 2 diabetes mellitus with diabetic chronic kidney disease: Secondary | ICD-10-CM | POA: Diagnosis present

## 2021-12-09 DIAGNOSIS — E876 Hypokalemia: Secondary | ICD-10-CM | POA: Diagnosis present

## 2021-12-09 DIAGNOSIS — E1110 Type 2 diabetes mellitus with ketoacidosis without coma: Principal | ICD-10-CM | POA: Diagnosis present

## 2021-12-09 DIAGNOSIS — I10 Essential (primary) hypertension: Secondary | ICD-10-CM | POA: Diagnosis present

## 2021-12-09 DIAGNOSIS — R739 Hyperglycemia, unspecified: Secondary | ICD-10-CM | POA: Diagnosis not present

## 2021-12-09 DIAGNOSIS — Z886 Allergy status to analgesic agent status: Secondary | ICD-10-CM

## 2021-12-09 DIAGNOSIS — I160 Hypertensive urgency: Secondary | ICD-10-CM | POA: Diagnosis present

## 2021-12-09 DIAGNOSIS — I151 Hypertension secondary to other renal disorders: Secondary | ICD-10-CM | POA: Diagnosis present

## 2021-12-09 DIAGNOSIS — E871 Hypo-osmolality and hyponatremia: Secondary | ICD-10-CM

## 2021-12-09 DIAGNOSIS — Z7901 Long term (current) use of anticoagulants: Secondary | ICD-10-CM | POA: Diagnosis not present

## 2021-12-09 DIAGNOSIS — E1310 Other specified diabetes mellitus with ketoacidosis without coma: Secondary | ICD-10-CM

## 2021-12-09 DIAGNOSIS — Z79899 Other long term (current) drug therapy: Secondary | ICD-10-CM | POA: Diagnosis not present

## 2021-12-09 DIAGNOSIS — N179 Acute kidney failure, unspecified: Secondary | ICD-10-CM | POA: Diagnosis present

## 2021-12-09 DIAGNOSIS — Z800 Family history of malignant neoplasm of digestive organs: Secondary | ICD-10-CM | POA: Diagnosis not present

## 2021-12-09 DIAGNOSIS — Z87891 Personal history of nicotine dependence: Secondary | ICD-10-CM | POA: Diagnosis not present

## 2021-12-09 DIAGNOSIS — E1100 Type 2 diabetes mellitus with hyperosmolarity without nonketotic hyperglycemic-hyperosmolar coma (NKHHC): Principal | ICD-10-CM

## 2021-12-09 DIAGNOSIS — Z8249 Family history of ischemic heart disease and other diseases of the circulatory system: Secondary | ICD-10-CM

## 2021-12-09 DIAGNOSIS — Q613 Polycystic kidney, unspecified: Secondary | ICD-10-CM | POA: Diagnosis not present

## 2021-12-09 DIAGNOSIS — N189 Chronic kidney disease, unspecified: Secondary | ICD-10-CM | POA: Diagnosis present

## 2021-12-09 DIAGNOSIS — Z6841 Body Mass Index (BMI) 40.0 and over, adult: Secondary | ICD-10-CM | POA: Diagnosis not present

## 2021-12-09 LAB — BASIC METABOLIC PANEL
Anion gap: 18 — ABNORMAL HIGH (ref 5–15)
Anion gap: 15 (ref 5–15)
BUN: 42 mg/dL — ABNORMAL HIGH (ref 6–20)
BUN: 40 mg/dL — ABNORMAL HIGH (ref 6–20)
CO2: 17 mmol/L — ABNORMAL LOW (ref 22–32)
CO2: 19 mmol/L — ABNORMAL LOW (ref 22–32)
Calcium: 8.9 mg/dL (ref 8.9–10.3)
Calcium: 9 mg/dL (ref 8.9–10.3)
Chloride: 81 mmol/L — ABNORMAL LOW (ref 98–111)
Chloride: 93 mmol/L — ABNORMAL LOW (ref 98–111)
Creatinine, Ser: 4.53 mg/dL — ABNORMAL HIGH (ref 0.61–1.24)
Creatinine, Ser: 4.04 mg/dL — ABNORMAL HIGH (ref 0.61–1.24)
GFR, Estimated: 15 mL/min — ABNORMAL LOW (ref >60)
GFR, Estimated: 18 mL/min — ABNORMAL LOW (ref >60)
Glucose, Bld: 1034 mg/dL (ref 70–99)
Glucose, Bld: 758 mg/dL (ref 70–99)
Potassium: 4.6 mmol/L (ref 3.5–5.1)
Potassium: 4.5 mmol/L (ref 3.5–5.1)
Sodium: 116 mmol/L — CL (ref 135–145)
Sodium: 127 mmol/L — ABNORMAL LOW (ref 135–145)

## 2021-12-09 LAB — HEPATIC FUNCTION PANEL
ALT: 29 U/L (ref 0–44)
AST: 22 U/L (ref 15–41)
Albumin: 4.3 g/dL (ref 3.5–5.0)
Alkaline Phosphatase: 93 U/L (ref 38–126)
Bilirubin, Direct: 0.2 mg/dL (ref 0.0–0.2)
Indirect Bilirubin: 0.8 mg/dL (ref 0.3–0.9)
Total Bilirubin: 1 mg/dL (ref 0.3–1.2)
Total Protein: 7.6 g/dL (ref 6.5–8.1)

## 2021-12-09 LAB — GLUCOSE, CAPILLARY
Glucose-Capillary: 568 mg/dL (ref 70–99)
Glucose-Capillary: 590 mg/dL (ref 70–99)
Glucose-Capillary: >600 mg/dL (ref 70–99)
Glucose-Capillary: >600 mg/dL (ref 70–99)
Glucose-Capillary: >600 mg/dL (ref 70–99)
Glucose-Capillary: >600 mg/dL (ref 70–99)
Glucose-Capillary: >600 mg/dL (ref 70–99)
Glucose-Capillary: >600 mg/dL (ref 70–99)

## 2021-12-09 LAB — CBG MONITORING, ED
Glucose-Capillary: >600 mg/dL (ref 70–99)
Glucose-Capillary: >600 mg/dL (ref 70–99)
Glucose-Capillary: >600 mg/dL (ref 70–99)
Glucose-Capillary: >600 mg/dL (ref 70–99)
Glucose-Capillary: >600 mg/dL (ref 70–99)
Glucose-Capillary: >600 mg/dL (ref 70–99)

## 2021-12-09 LAB — I-STAT CHEM 8, ED
BUN: 55 mg/dL — ABNORMAL HIGH (ref 6–20)
Calcium, Ion: 0.94 mmol/L — ABNORMAL LOW (ref 1.15–1.40)
Chloride: 87 mmol/L — ABNORMAL LOW (ref 98–111)
Creatinine, Ser: 4.6 mg/dL — ABNORMAL HIGH (ref 0.61–1.24)
Glucose, Bld: >700 mg/dL (ref 70–99)
HCT: 41 % (ref 39.0–52.0)
Hemoglobin: 13.9 g/dL (ref 13.0–17.0)
Potassium: 5.2 mmol/L — ABNORMAL HIGH (ref 3.5–5.1)
Sodium: 115 mmol/L — CL (ref 135–145)
TCO2: 18 mmol/L — ABNORMAL LOW (ref 22–32)

## 2021-12-09 LAB — CBC
HCT: 36.6 % — ABNORMAL LOW (ref 39.0–52.0)
Hemoglobin: 13.3 g/dL (ref 13.0–17.0)
MCH: 31.7 pg (ref 26.0–34.0)
MCHC: 36.3 g/dL — ABNORMAL HIGH (ref 30.0–36.0)
MCV: 87.1 fL (ref 80.0–100.0)
Platelets: 294 10*3/uL (ref 150–400)
RBC: 4.2 MIL/uL — ABNORMAL LOW (ref 4.22–5.81)
RDW: 12.7 % (ref 11.5–15.5)
WBC: 8.1 10*3/uL (ref 4.0–10.5)
nRBC: 0 % (ref 0.0–0.2)

## 2021-12-09 LAB — BETA-HYDROXYBUTYRIC ACID: Beta-Hydroxybutyric Acid: 1.43 mmol/L — ABNORMAL HIGH (ref 0.05–0.27)

## 2021-12-09 LAB — TROPONIN I (HIGH SENSITIVITY): Troponin I (High Sensitivity): 7 ng/L (ref <18)

## 2021-12-09 MED ORDER — SODIUM BICARBONATE 650 MG PO TABS
650.0000 mg | ORAL_TABLET | Freq: Two times a day (BID) | ORAL | Status: DC
Start: 2021-12-10 — End: 2021-12-13
  Administered 2021-12-09 – 2021-12-13 (×8): 650 mg via ORAL
  Filled 2021-12-09 (×8): qty 1

## 2021-12-09 MED ORDER — HYDRALAZINE HCL 50 MG PO TABS
75.0000 mg | ORAL_TABLET | Freq: Three times a day (TID) | ORAL | Status: DC
  Administered 2021-12-09 – 2021-12-13 (×11): 75 mg via ORAL
  Filled 2021-12-09 (×11): qty 1

## 2021-12-09 MED ORDER — AMLODIPINE BESYLATE 10 MG PO TABS
10.0000 mg | ORAL_TABLET | Freq: Every day | ORAL | Status: DC
  Administered 2021-12-09 – 2021-12-12 (×4): 10 mg via ORAL
  Filled 2021-12-09 (×4): qty 1

## 2021-12-09 MED ORDER — CHLORHEXIDINE GLUCONATE CLOTH 2 % EX PADS
6.0000 | MEDICATED_PAD | Freq: Every day | CUTANEOUS | Status: DC
  Administered 2021-12-09 – 2021-12-12 (×3): 6 via TOPICAL

## 2021-12-09 MED ORDER — METOPROLOL SUCCINATE ER 50 MG PO TB24
50.0000 mg | ORAL_TABLET | Freq: Every day | ORAL | Status: DC
  Administered 2021-12-10 – 2021-12-13 (×4): 50 mg via ORAL
  Filled 2021-12-09: qty 1
  Filled 2021-12-09 (×3): qty 2

## 2021-12-09 MED ORDER — LACTATED RINGERS IV BOLUS
1000.0000 mL | Freq: Once | INTRAVENOUS | Status: AC
  Administered 2021-12-09: 1000 mL via INTRAVENOUS

## 2021-12-09 MED ORDER — POTASSIUM CHLORIDE 10 MEQ/100ML IV SOLN
10.0000 meq | INTRAVENOUS | Status: AC
  Administered 2021-12-09 (×2): 10 meq via INTRAVENOUS
  Filled 2021-12-09 (×2): qty 100

## 2021-12-09 MED ORDER — HYDRALAZINE HCL 20 MG/ML IJ SOLN
5.0000 mg | INTRAMUSCULAR | Status: DC | PRN
  Filled 2021-12-09: qty 1

## 2021-12-09 MED ORDER — DEXTROSE 50 % IV SOLN: 0.0000 mL | INTRAVENOUS | Status: DC | PRN

## 2021-12-09 MED ORDER — TOLVAPTAN 15 MG PO TABS: 90.0000 mg | ORAL_TABLET | Freq: Every day | ORAL | Status: DC

## 2021-12-09 MED ORDER — DEXTROSE IN LACTATED RINGERS 5 % IV SOLN
INTRAVENOUS | Status: DC
Start: 2021-12-09 — End: 2021-12-10

## 2021-12-09 MED ORDER — INSULIN REGULAR(HUMAN) IN NACL 100-0.9 UT/100ML-% IV SOLN
INTRAVENOUS | Status: DC
  Administered 2021-12-10: 13 [IU]/h via INTRAVENOUS
  Administered 2021-12-10: 11.5 [IU]/h via INTRAVENOUS
  Filled 2021-12-09 (×2): qty 100

## 2021-12-09 MED ORDER — LACTATED RINGERS IV SOLN: INTRAVENOUS | Status: DC

## 2021-12-09 MED ORDER — LACTATED RINGERS IV BOLUS
1000.0000 mL | INTRAVENOUS | Status: AC
  Administered 2021-12-09: 1000 mL via INTRAVENOUS

## 2021-12-09 MED ORDER — DEXTROSE IN LACTATED RINGERS 5 % IV SOLN: INTRAVENOUS | Status: DC

## 2021-12-09 MED ORDER — TOLVAPTAN 90 & 30 MG PO TBPK
30.0000 mg | ORAL_TABLET | Freq: Two times a day (BID) | ORAL | Status: DC
Start: 2021-12-10 — End: 2021-12-13
  Administered 2021-12-10: 90 mg via ORAL
  Administered 2021-12-10 – 2021-12-11 (×2): 30 mg via ORAL
  Administered 2021-12-11: 90 mg via ORAL
  Administered 2021-12-12: 30 mg via ORAL
  Administered 2021-12-12: 90 mg via ORAL
  Administered 2021-12-13: 30 mg via ORAL

## 2021-12-09 MED ORDER — INSULIN REGULAR(HUMAN) IN NACL 100-0.9 UT/100ML-% IV SOLN
INTRAVENOUS | Status: DC
  Administered 2021-12-09: 5 [IU]/h via INTRAVENOUS
  Filled 2021-12-09: qty 100

## 2021-12-09 MED ORDER — HEPARIN SODIUM (PORCINE) 5000 UNIT/ML IJ SOLN
5000.0000 [IU] | Freq: Three times a day (TID) | INTRAMUSCULAR | Status: DC
Start: 2021-12-09 — End: 2021-12-13
  Administered 2021-12-09 – 2021-12-13 (×11): 5000 [IU] via SUBCUTANEOUS
  Filled 2021-12-09 (×11): qty 1

## 2021-12-09 MED ORDER — TOLVAPTAN 15 MG PO TABS: 30.0000 mg | ORAL_TABLET | Freq: Every day | ORAL | Status: DC

## 2021-12-09 MED ORDER — TOLVAPTAN 90 & 30 MG PO TBPK
30.0000 mg | ORAL_TABLET | Freq: Two times a day (BID) | ORAL | Status: DC
Start: 2021-12-10 — End: 2021-12-09

## 2021-12-09 NOTE — ED Notes (Signed)
Patient transported upstairs at this time.

## 2021-12-09 NOTE — ED Triage Notes (Addendum)
Pt had dizziness, fatigue, hand cramping, nausea with dry heaves, not eating normally for past 2 weeks. Has blood work done every month. Blood sugar was 400 on Monday.

## 2021-12-09 NOTE — ED Triage Notes (Signed)
Pt reports going to UC for blurred vision and weakness. Pt reports the CBG monitor read HI there. Was sent to ED. Pt denies Hx of diabetes.

## 2021-12-09 NOTE — Discharge Instructions (Signed)
D/C to ED 

## 2021-12-09 NOTE — H&P (Signed)
History and Physical    BRAVERY KETCHAM SVX:[id number] DOB: 1975/08/15 DOA: 12/09/2021  PCP: [name], NP  Patient coming from: Home.  Chief Complaint: Blurred vision nausea vomiting.  HPI: Kevin Todd is a 46 y.o. male with history of chronic kidney disease stage IV, polycystic kidney disease, hypertension presents to the ER after patient has been having nausea vomiting and blurred vision for the last few days.  Patient also has been tingling and numbness of the upper extremities.  Denies any loss of consciousness.  Denies chest pain or shortness of breath.  ED Course: In the ER patient was found to have blood sugar of 1034 with anion gap of 18 bicarb of 17.  Patient was started on IV fluids and IV insulin and admitted for hyperosmolar status with early DKA features.  Review of Systems: As per HPI, rest all negative.   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 His smoking use included cigarettes. He smoked an average of .25 packs per day. He has never used smokeless tobacco. He reports that he does not currently use alcohol. He reports that he does not use drugs.  Allergies  Allergen Reactions   Nsaids     Kidney Disease     Family History  Problem Relation Age of Onset   Hypertension Mother    Colon cancer Maternal Grandfather    Stomach cancer Neg Hx    Esophageal cancer Neg Hx    Pancreatic cancer Neg Hx     Prior to Admission medications   Medication Sig Start Date End Date Taking? Authorizing Provider  amLODipine (NORVASC) 10 MG tablet Take 1 tablet (10 mg total) by mouth daily. 09/19/19  Yes Sheikh, Omair Latif, DO  furosemide (LASIX) 40 MG tablet Take 40 mg by mouth daily. 11/08/21  Yes [provider]  hydrALAZINE (APRESOLINE) 25 MG tablet Take 75 mg by mouth in the morning, at noon, and at bedtime.   Yes [provider]  JYNARQUE 90 & 30 MG TBPK Take 1 tablet by mouth in the morning and at bedtime. 12/02/21  Yes [provider]  metoprolol succinate (TOPROL-XL) 50 MG 24 hr tablet Take 50 mg by mouth daily. 01/16/20  Yes [provider]  sodium bicarbonate 650 MG tablet Take 650 mg by mouth 2 (two) times daily. 10/31/21  Yes [provider]  acetaminophen (TYLENOL) 500 MG tablet Take 2 tablets (1,000 mg total) by mouth in the morning and at bedtime. Patient not taking: Reported on 12/09/2021 10/31/19   Azzie Glatter, FNP  docusate sodium (COLACE) 100 MG capsule Take 1 capsule (100 mg total) by mouth 2 (two) times daily. While taking narcotic pain medicine. Patient not taking: Reported  on 12/09/2021 04/14/21   Corky Sing, PA-C  oxyCODONE (OXY IR/ROXICODONE) 5 MG immediate release tablet Take 5 mg by mouth daily as needed for severe pain or moderate pain.    [provider]  rivaroxaban (XARELTO) 10 MG TABS tablet Take 1 tablet (10 mg total) by mouth daily. Patient not taking: Reported on 12/09/2021 04/14/21   Corky Sing, PA-C  senna (SENOKOT) 8.6 MG TABS tablet Take 2 tablets (17.2 mg total) by mouth 2 (two) times daily. Patient not taking: Reported on 12/09/2021 04/14/21   Corky Sing, PA-C  traZODone (DESYREL) 100 MG tablet TAKE 1  TABLET (100 MG TOTAL) BY MOUTH AT BEDTIME AS NEEDED FOR SLEEP. Patient not taking: Reported on 12/09/2021 07/26/20   Vevelyn Francois, NP  Vitamin D, Ergocalciferol, (DRISDOL) 1.25 MG (50000 UNIT) CAPS capsule TAKE 1 CAPSULE (50,000 UNITS TOTAL) BY MOUTH EVERY 7 (SEVEN) DAYS. Patient not taking: Reported on 12/09/2021 06/21/20   Azzie Glatter, FNP    Physical Exam: Constitutional: Moderately built and nourished. Vitals:   12/09/21 1915 12/09/21 1930 12/09/21 1939 12/09/21 2000  BP: (!) 177/89 (!) 154/89    Pulse: 86 85    Resp: 17 12    Temp:   98.1 F (36.7 C) 98.4 F (36.9 C)  TempSrc:   Oral Oral  SpO2: 100% 100%    Weight:    135.3 kg  Height:       Eyes: Anicteric no pallor. ENMT: No discharge from the ears eyes nose and mouth. Neck: No mass felt.  No neck rigidity. Respiratory: No rhonchi or crepitations. Cardiovascular: S1-S2 heard. Abdomen: Soft nontender bowel sound present. Musculoskeletal: No edema. Skin: No rash. Neurologic: Alert awake oriented time place and person.  Moves all extremities.  Pupils equal react to light.  No facial asymmetry.  Tongue is midline. Psychiatric: Appears normal.  Normal affect.   Labs on Admission: I have personally reviewed following labs and imaging studies  CBC: Recent Labs  Lab 12/09/21 1658 12/09/21 1704  WBC 8.1  --   HGB 13.3 13.9  HCT 36.6* 41.0  MCV 87.1  --   PLT 294  --    Basic Metabolic Panel: Recent Labs  Lab 12/09/21 1658 12/09/21 1704  NA 116* 115*  K 4.6 5.2*  CL 81* 87*  CO2 17*  --   GLUCOSE 1,034* >700*  BUN 42* 55*  CREATININE 4.53* 4.60*  CALCIUM 8.9  --    GFR: Estimated Creatinine Clearance: 27 mL/min (A) (by C-G formula based on SCr of 4.6 mg/dL (H)). Liver Function Tests: Recent Labs  Lab 12/09/21 1658  AST 22  ALT 29  ALKPHOS 93  BILITOT 1.0  PROT 7.6  ALBUMIN 4.3   No results for input(s): "LIPASE", "AMYLASE" in the last 168 hours. No results for input(s): "AMMONIA" in the last  168 hours. Coagulation Profile: No results for input(s): "INR", "PROTIME" in the last 168 hours. Cardiac Enzymes: No results for input(s): "CKTOTAL", "CKMB", "CKMBINDEX", "TROPONINI" in the last 168 hours. BNP (last 3 results) No results for input(s): "PROBNP" in the last 8760 hours. HbA1C: No results for input(s): "HGBA1C" in the last 72 hours. CBG: Recent Labs  Lab 12/09/21 1750 12/09/21 1827 12/09/21 1858 12/09/21 2008 12/09/21 2039  GLUCAP >600* >600* >600* >600* >600*   Lipid Profile: No results for input(s): "CHOL", "HDL", "LDLCALC", "TRIG", "CHOLHDL", "LDLDIRECT" in the last 72 hours. Thyroid Function Tests: No results for input(s): "TSH", "T4TOTAL", "FREET4", "T3FREE", "THYROIDAB" in the  last 72 hours. Anemia Panel: No results for input(s): "VITAMINB12", "FOLATE", "FERRITIN", "TIBC", "IRON", "RETICCTPCT" in the last 72 hours. Urine analysis:    Component Value Date/Time   COLORURINE RED (A) 09/17/2019 1351   APPEARANCEUR TURBID (A) 09/17/2019 1351   LABSPEC  09/17/2019 1351    TEST NOT REPORTED DUE TO COLOR INTERFERENCE OF URINE PIGMENT   PHURINE  09/17/2019 1351    TEST NOT REPORTED DUE TO COLOR INTERFERENCE OF URINE PIGMENT   GLUCOSEU (A) 09/17/2019 1351    TEST NOT REPORTED DUE TO COLOR INTERFERENCE OF URINE PIGMENT   HGBUR (A) 09/17/2019 1351    TEST NOT REPORTED DUE TO COLOR INTERFERENCE OF URINE PIGMENT   BILIRUBINUR Negative 09/29/2019 1330   KETONESUR (A) 09/17/2019 1351    TEST NOT REPORTED DUE TO COLOR INTERFERENCE OF URINE PIGMENT   PROTEINUR Positive (A) 09/29/2019 1330   PROTEINUR (A) 09/17/2019 1351    TEST NOT REPORTED DUE TO COLOR INTERFERENCE OF URINE PIGMENT   UROBILINOGEN 0.2 09/29/2019 1330   NITRITE Negative 09/29/2019 1330   NITRITE (A) 09/17/2019 1351    TEST NOT REPORTED DUE TO COLOR INTERFERENCE OF URINE PIGMENT   LEUKOCYTESUR Small (1+) (A) 09/29/2019 1330   LEUKOCYTESUR (A) 09/17/2019 1351    TEST NOT REPORTED DUE TO COLOR  INTERFERENCE OF URINE PIGMENT   Sepsis Labs: '@LABRCNTIP'$ (procalcitonin:4,lacticidven:4) )No results found for this or any previous visit (from the past 240 hour(s)).   Radiological Exams on Admission: No results found.  EKG: Independently reviewed.  Normal sinus rhythm.  Assessment/Plan Principal Problem:   DKA (diabetic ketoacidosis) ([place]) Active Problems:   Hypertensive urgency   Polycystic kidney disease    Hyperosmolar status with early DKA features for which patient has been started on IV insulin infusion and fluids per DKA protocol.  We will check beta-hydroxybutyrate acid follow metabolic panel closely until anion gap gets corrected.  Check hemoglobin A1c. Chronic kidney disease stage IV likely from polycystic kidney disease patient takes tolvaptan.  Hold Lasix.  Takes sodium bicarbonate. Hyponatremia likely from hyperglycemic status also possibly from dehydration.  I think will improve with correction of glucose.  Follow metabolic panel closely. Hypertension on beta-blockers amlodipine and hydralazine. Blurred vision and nausea vomiting likely from DKA/hyperosmolar status.  I think will improve with correction of patient's metabolic derangements.  If persist may consider further imaging.  Appears nonfocal.  Since patient has hyperosmolar status/DKA with severe metabolic derangements will need close monitoring and further management inpatient status.   DVT prophylaxis: Heparin. Code Status: Full code. Family Communication: Family at the bedside. Disposition Plan: Home. Consults called: None. Admission status: Inpatient.   Rise Patience MD [facility] Pager [phone].  If 7PM-7AM, please contact night-coverage [email or web address] Password [facility]  12/09/2021, 9:16 PM

## 2021-12-09 NOTE — ED Provider Notes (Signed)
[facility] DEPT Provider Note   CSN: [id number] Arrival date & time: 12/09/21  1631     History  Chief Complaint  Patient presents with   Hyperglycemia    Kevin Todd is a 46 y.o. male.  46 yo M with a chief complaints of hyperglycemia.  He has been feeling very weak and felt like his vision had gotten blurry.  He went to urgent care was noted to have a blood sugar greater than 600.  He denies history of diabetes.  Was sent here for evaluation.  He tells me that he has had polyphagia polyuria but has had that for quite some time.  He attributed that to a medicine he takes for his CKD.  He denies chest pain cough congestion or fever.  He denies one-sided numbness or weakness difficulty speech or swallowing   Hyperglycemia      Home Medications Prior to Admission medications   Medication Sig Start Date End Date Taking? Authorizing Provider  acetaminophen (TYLENOL) 500 MG tablet Take 2 tablets (1,000 mg total) by mouth in the morning and at bedtime. 10/31/19   Azzie Glatter, FNP  amLODipine (NORVASC) 10 MG tablet Take 1 tablet (10 mg total) by mouth daily. 09/19/19   Raiford Noble Latif, DO  docusate sodium (COLACE) 100 MG capsule Take 1 capsule (100 mg total) by mouth 2 (two) times daily. While taking narcotic pain medicine. 04/14/21   Corky Sing, PA-C  furosemide (LASIX) 40 MG tablet Take 40 mg by mouth daily. 11/08/21   [provider]  hydrALAZINE (APRESOLINE) 25 MG tablet Take 1 tablet by mouth in the morning, at noon, and at bedtime.    [provider]  hydrALAZINE (APRESOLINE) 50 MG tablet Take 50 mg by mouth 3 (three) times daily. 05/16/20   [provider]  HYDROcodone-acetaminophen (NORCO/VICODIN) 5-325 MG tablet Take 1 tablet by mouth as needed.    [provider]  JYNARQUE 90 & 30 MG TBPK Take 1 tablet by mouth in the morning and at bedtime. 12/02/21   [provider]  metoprolol succinate  (TOPROL-XL) 50 MG 24 hr tablet Take 50 mg by mouth daily. 01/16/20   [provider]  oxyCODONE (OXY IR/ROXICODONE) 5 MG immediate release tablet Take 5 mg by mouth daily as needed.    [provider]  rivaroxaban (XARELTO) 10 MG TABS tablet Take 1 tablet (10 mg total) by mouth daily. 04/14/21   Corky Sing, PA-C  senna (SENOKOT) 8.6 MG TABS tablet Take 2 tablets (17.2 mg total) by mouth 2 (two) times daily. 04/14/21   Corky Sing, PA-C  sodium bicarbonate 650 MG tablet Take 650 mg by mouth 2 (two) times daily. 10/31/21   [provider]  Tolvaptan 30 MG TABS Take by mouth.    [provider]  traZODone (DESYREL) 100 MG tablet TAKE 1 TABLET (100 MG TOTAL) BY MOUTH AT BEDTIME AS NEEDED FOR SLEEP. 07/26/20   Vevelyn Francois, NP  traZODone (DESYREL) 100 MG tablet Take 1 tablet by mouth at bedtime as needed.    [provider]  Vitamin D, Ergocalciferol, (DRISDOL) 1.25 MG (50000 UNIT) CAPS capsule TAKE 1 CAPSULE (50,000 UNITS TOTAL) BY MOUTH EVERY 7 (SEVEN) DAYS. 06/21/20   Azzie Glatter, FNP      Allergies    Patient has no known allergies.    Review of Systems   Review of Systems  Physical Exam Updated Vital Signs BP (!) 194/99 (  BP Location: Left Arm)   Pulse 76   Temp 98.1 F (36.7 C) (Oral)   Resp 17   Ht '5\' 8"'$  (1.727 m)   Wt (!) 145.2 kg   SpO2 99%   BMI 48.66 kg/m  Physical Exam Vitals and nursing note reviewed.  Constitutional:      Appearance: He is well-developed.  HENT:     Head: Normocephalic and atraumatic.  Eyes:     Pupils: Pupils are equal, round, and reactive to light.  Neck:     Vascular: No JVD.  Cardiovascular:     Rate and Rhythm: Normal rate and regular rhythm.     Heart sounds: No murmur heard.    No friction rub. No gallop.  Pulmonary:     Effort: No respiratory distress.     Breath sounds: No wheezing.  Abdominal:     General: There is no distension.     Tenderness: There is no abdominal  tenderness. There is no guarding or rebound.  Musculoskeletal:        General: Normal range of motion.     Cervical back: Normal range of motion and neck supple.  Skin:    Coloration: Skin is not pale.     Findings: No rash.  Neurological:     Mental Status: He is alert and oriented to person, place, and time.  Psychiatric:        Behavior: Behavior normal.     ED Results / Procedures / Treatments   Labs (all labs ordered are listed, but only abnormal results are displayed) Labs Reviewed  BASIC METABOLIC PANEL - Abnormal; Notable for the following components:      Result Value   Sodium 116 (*)    Chloride 81 (*)    CO2 17 (*)    Glucose, Bld 1,034 (*)    BUN 42 (*)    Creatinine, Ser 4.53 (*)    GFR, Estimated 15 (*)    Anion gap 18 (*)    All other components within normal limits  CBC - Abnormal; Notable for the following components:   RBC 4.20 (*)    HCT 36.6 (*)    MCHC 36.3 (*)    All other components within normal limits  CBG MONITORING, ED - Abnormal; Notable for the following components:   Glucose-Capillary >600 (*)    All other components within normal limits  I-STAT CHEM 8, ED - Abnormal; Notable for the following components:   Sodium 115 (*)    Potassium 5.2 (*)    Chloride 87 (*)    BUN 55 (*)    Creatinine, Ser 4.60 (*)    Glucose, Bld >700 (*)    Calcium, Ion 0.94 (*)    TCO2 18 (*)    All other components within normal limits  CBG MONITORING, ED - Abnormal; Notable for the following components:   Glucose-Capillary >600 (*)    All other components within normal limits  CBG MONITORING, ED - Abnormal; Notable for the following components:   Glucose-Capillary >600 (*)    All other components within normal limits  URINALYSIS, ROUTINE W REFLEX MICROSCOPIC  HEPATIC FUNCTION PANEL  OSMOLALITY  BASIC METABOLIC PANEL  BASIC METABOLIC PANEL  BASIC METABOLIC PANEL    EKG EKG Interpretation  Date/Time:  Friday December 09 2021 16:51:53 EDT Ventricular  Rate:  83 PR Interval:  186 QRS Duration: 103 QT Interval:  399 QTC Calculation: 469 R Axis:   9 Text Interpretation: Sinus rhythm Anteroseptal infarct,  old No significant change since last tracing Confirmed by Deno Etienne [phone]) on 12/09/2021 5:03:44 PM  Radiology No results found.  Procedures Procedures    Medications Ordered in ED Medications  insulin regular, human (MYXREDLIN) 100 units/ 100 mL infusion (5 Units/hr Intravenous New Bag/Given 12/09/21 1757)  lactated ringers infusion (has no administration in time range)  dextrose 5 % in lactated ringers infusion (has no administration in time range)  dextrose 50 % solution 0-50 mL (has no administration in time range)  potassium chloride 10 mEq in 100 mL IVPB (10 mEq Intravenous New Bag/Given 12/09/21 1755)  lactated ringers bolus 1,000 mL (has no administration in time range)  lactated ringers bolus 1,000 mL (1,000 mLs Intravenous New Bag/Given 12/09/21 1659)    ED Course/ Medical Decision Making/ A&P                           Medical Decision Making Amount and/or Complexity of Data Reviewed Labs: ordered.  Risk Prescription drug management. Decision regarding hospitalization.   46 yo M with a chief complaints of blurry vision.  Patient was found to have a blood sugar greater than 600 at urgent care and was sent here for evaluation.  He denies history of diabetes.  Does have a history of CKD.  Has had polyphagia polydipsia but has had that chronically.  Patient found to have a blood sugar about thousand and 34, bicarb 17 anion gap mildly elevated to 18.  Started on a insulin infusion for HHS.  Discussed with hospitalist for admission.  CRITICAL CARE Performed by: Cecilio Asper   Total critical care time: 35 minutes  Critical care time was exclusive of separately billable procedures and treating other patients.  Critical care was necessary to treat or prevent imminent or life-threatening  deterioration.  Critical care was time spent personally by me on the following activities: development of treatment plan with patient and/or surrogate as well as nursing, discussions with consultants, evaluation of patient's response to treatment, examination of patient, obtaining history from patient or surrogate, ordering and performing treatments and interventions, ordering and review of laboratory studies, ordering and review of radiographic studies, pulse oximetry and re-evaluation of patient's condition.  The patients results and plan were reviewed and discussed.   Any x-rays performed were independently reviewed by myself.   Differential diagnosis were considered with the presenting HPI.  Medications  insulin regular, human (MYXREDLIN) 100 units/ 100 mL infusion (5 Units/hr Intravenous New Bag/Given 12/09/21 1757)  lactated ringers infusion (has no administration in time range)  dextrose 5 % in lactated ringers infusion (has no administration in time range)  dextrose 50 % solution 0-50 mL (has no administration in time range)  potassium chloride 10 mEq in 100 mL IVPB (10 mEq Intravenous New Bag/Given 12/09/21 1755)  lactated ringers bolus 1,000 mL (has no administration in time range)  lactated ringers bolus 1,000 mL (1,000 mLs Intravenous New Bag/Given 12/09/21 1659)    Vitals:   12/09/21 1730 12/09/21 1745 12/09/21 1800 12/09/21 1812  BP: (!) 200/104  (!) 194/99 (!) 194/99  Pulse:      Resp: '14 12 17 17  '$ Temp:    98.1 F (36.7 C)  TempSrc:    Oral  SpO2:    99%  Weight:      Height:        Final diagnoses:  Type 2 diabetes mellitus with hyperosmolar hyperglycemic state (HHS) ([place])    Admission/ observation were  discussed with the admitting physician, patient and/or family and they are comfortable with the plan.         Final Clinical Impression(s) / ED Diagnoses Final diagnoses:  Type 2 diabetes mellitus with hyperosmolar hyperglycemic state (HHS) [facility])    Rx /  DC Orders ED Discharge Orders     None         Deno Etienne, DO 12/09/21 1820

## 2021-12-09 NOTE — ED Notes (Signed)
Critical Lab Value  Glucose 1034 Sodium 116  Reported to D Tyrone Nine

## 2021-12-09 NOTE — ED Provider Notes (Signed)
Presents to urgent care with dizziness, fatigue, muscle cramping, numbness and tingling, nausea.  Reports he had blood work done on Monday and glucose was over 400. Unknown history of diabetes.  Significant other is bedside and history is unclear.  Patient is very ill-appearing in the room.  Blood glucose over 600.  Urgent care monitor does not measure past 600, very likely could be higher than this.  Immediately recommended patient be evaluated in the emergency department.  Potentially life-threatening condition with this glucose level. Declined EMS transport.  Significant other will transport him to ED via personal vehicle.   Nena Hampe, [place], [place] 12/09/21 1623

## 2021-12-09 NOTE — ED Notes (Signed)
Patient is being discharged from the Urgent Care and sent to the Emergency Department via POV with signifcant other . Per [facility], [place], patient is in need of higher level of care due to blood glucose greater 600. Patient is aware and verbalizes understanding of plan of care.  Vitals:   12/09/21 1602  BP: (!) 160/82  Pulse: 85  Resp: 16  Temp: 98.6 F (37 C)  SpO2: 97%

## 2021-12-10 DIAGNOSIS — Q613 Polycystic kidney, unspecified: Secondary | ICD-10-CM

## 2021-12-10 DIAGNOSIS — E871 Hypo-osmolality and hyponatremia: Secondary | ICD-10-CM

## 2021-12-10 DIAGNOSIS — E1310 Other specified diabetes mellitus with ketoacidosis without coma: Secondary | ICD-10-CM | POA: Diagnosis not present

## 2021-12-10 DIAGNOSIS — E876 Hypokalemia: Secondary | ICD-10-CM

## 2021-12-10 LAB — BETA-HYDROXYBUTYRIC ACID
Beta-Hydroxybutyric Acid: 0.45 mmol/L — ABNORMAL HIGH (ref 0.05–0.27)
Beta-Hydroxybutyric Acid: 0.26 mmol/L (ref 0.05–0.27)

## 2021-12-10 LAB — GLUCOSE, CAPILLARY
Glucose-Capillary: 178 mg/dL — ABNORMAL HIGH (ref 70–99)
Glucose-Capillary: 209 mg/dL — ABNORMAL HIGH (ref 70–99)
Glucose-Capillary: 221 mg/dL — ABNORMAL HIGH (ref 70–99)
Glucose-Capillary: 234 mg/dL — ABNORMAL HIGH (ref 70–99)
Glucose-Capillary: 242 mg/dL — ABNORMAL HIGH (ref 70–99)
Glucose-Capillary: 243 mg/dL — ABNORMAL HIGH (ref 70–99)
Glucose-Capillary: 260 mg/dL — ABNORMAL HIGH (ref 70–99)
Glucose-Capillary: 261 mg/dL — ABNORMAL HIGH (ref 70–99)
Glucose-Capillary: 263 mg/dL — ABNORMAL HIGH (ref 70–99)
Glucose-Capillary: 272 mg/dL — ABNORMAL HIGH (ref 70–99)
Glucose-Capillary: 273 mg/dL — ABNORMAL HIGH (ref 70–99)
Glucose-Capillary: 281 mg/dL — ABNORMAL HIGH (ref 70–99)
Glucose-Capillary: 299 mg/dL — ABNORMAL HIGH (ref 70–99)
Glucose-Capillary: 307 mg/dL — ABNORMAL HIGH (ref 70–99)
Glucose-Capillary: 315 mg/dL — ABNORMAL HIGH (ref 70–99)
Glucose-Capillary: 398 mg/dL — ABNORMAL HIGH (ref 70–99)
Glucose-Capillary: 438 mg/dL — ABNORMAL HIGH (ref 70–99)
Glucose-Capillary: 461 mg/dL — ABNORMAL HIGH (ref 70–99)
Glucose-Capillary: 506 mg/dL (ref 70–99)
Glucose-Capillary: 517 mg/dL (ref 70–99)
Glucose-Capillary: 552 mg/dL (ref 70–99)

## 2021-12-10 LAB — BASIC METABOLIC PANEL
Anion gap: 12 (ref 5–15)
Anion gap: 12 (ref 5–15)
Anion gap: 9 (ref 5–15)
Anion gap: 10 (ref 5–15)
BUN: 37 mg/dL — ABNORMAL HIGH (ref 6–20)
BUN: 35 mg/dL — ABNORMAL HIGH (ref 6–20)
BUN: 34 mg/dL — ABNORMAL HIGH (ref 6–20)
BUN: 36 mg/dL — ABNORMAL HIGH (ref 6–20)
CO2: 23 mmol/L (ref 22–32)
CO2: 23 mmol/L (ref 22–32)
CO2: 22 mmol/L (ref 22–32)
CO2: 22 mmol/L (ref 22–32)
Calcium: 9.1 mg/dL (ref 8.9–10.3)
Calcium: 9.2 mg/dL (ref 8.9–10.3)
Calcium: 9.2 mg/dL (ref 8.9–10.3)
Calcium: 8.8 mg/dL — ABNORMAL LOW (ref 8.9–10.3)
Chloride: 97 mmol/L — ABNORMAL LOW (ref 98–111)
Chloride: 99 mmol/L (ref 98–111)
Chloride: 103 mmol/L (ref 98–111)
Chloride: 102 mmol/L (ref 98–111)
Creatinine, Ser: 3.76 mg/dL — ABNORMAL HIGH (ref 0.61–1.24)
Creatinine, Ser: 3.58 mg/dL — ABNORMAL HIGH (ref 0.61–1.24)
Creatinine, Ser: 3.44 mg/dL — ABNORMAL HIGH (ref 0.61–1.24)
Creatinine, Ser: 3.64 mg/dL — ABNORMAL HIGH (ref 0.61–1.24)
GFR, Estimated: 19 mL/min — ABNORMAL LOW (ref >60)
GFR, Estimated: 20 mL/min — ABNORMAL LOW (ref >60)
GFR, Estimated: 21 mL/min — ABNORMAL LOW (ref >60)
GFR, Estimated: 20 mL/min — ABNORMAL LOW (ref >60)
Glucose, Bld: 418 mg/dL — ABNORMAL HIGH (ref 70–99)
Glucose, Bld: 221 mg/dL — ABNORMAL HIGH (ref 70–99)
Glucose, Bld: 276 mg/dL — ABNORMAL HIGH (ref 70–99)
Glucose, Bld: 288 mg/dL — ABNORMAL HIGH (ref 70–99)
Potassium: 3.2 mmol/L — ABNORMAL LOW (ref 3.5–5.1)
Potassium: 3.1 mmol/L — ABNORMAL LOW (ref 3.5–5.1)
Potassium: 3 mmol/L — ABNORMAL LOW (ref 3.5–5.1)
Potassium: 3.6 mmol/L (ref 3.5–5.1)
Sodium: 132 mmol/L — ABNORMAL LOW (ref 135–145)
Sodium: 134 mmol/L — ABNORMAL LOW (ref 135–145)
Sodium: 134 mmol/L — ABNORMAL LOW (ref 135–145)
Sodium: 134 mmol/L — ABNORMAL LOW (ref 135–145)

## 2021-12-10 LAB — HEMOGLOBIN A1C
Hgb A1c MFr Bld: 13.1 % — ABNORMAL HIGH (ref 4.8–5.6)
Mean Plasma Glucose: 329.27 mg/dL

## 2021-12-10 LAB — MRSA NEXT GEN BY PCR, NASAL: MRSA by PCR Next Gen: DETECTED — AB

## 2021-12-10 LAB — HIV ANTIBODY (ROUTINE TESTING W REFLEX): HIV Screen 4th Generation wRfx: NONREACTIVE

## 2021-12-10 LAB — TROPONIN I (HIGH SENSITIVITY): Troponin I (High Sensitivity): 9 ng/L (ref <18)

## 2021-12-10 LAB — OSMOLALITY: Osmolality: 311 mOsm/kg — ABNORMAL HIGH (ref 275–295)

## 2021-12-10 MED ORDER — INSULIN STARTER KIT- PEN NEEDLES (ENGLISH)
1.0000 | Freq: Once | Status: AC
  Administered 2021-12-10: 1
  Filled 2021-12-10: qty 1

## 2021-12-10 MED ORDER — LIVING WELL WITH DIABETES BOOK
Freq: Once | Status: AC
  Administered 2021-12-10: 1
  Filled 2021-12-10: qty 1

## 2021-12-10 MED ORDER — POTASSIUM CHLORIDE CRYS ER 20 MEQ PO TBCR
20.0000 meq | EXTENDED_RELEASE_TABLET | Freq: Once | ORAL | Status: AC
Start: 2021-12-10 — End: 2021-12-10
  Administered 2021-12-10: 20 meq via ORAL
  Filled 2021-12-10: qty 1

## 2021-12-10 MED ORDER — INSULIN ASPART 100 UNIT/ML IJ SOLN
0.0000 [IU] | Freq: Three times a day (TID) | INTRAMUSCULAR | Status: DC
  Administered 2021-12-10: 4 [IU] via SUBCUTANEOUS

## 2021-12-10 MED ORDER — POTASSIUM CHLORIDE CRYS ER 20 MEQ PO TBCR
40.0000 meq | EXTENDED_RELEASE_TABLET | Freq: Once | ORAL | Status: AC
  Administered 2021-12-10: 40 meq via ORAL
  Filled 2021-12-10: qty 2

## 2021-12-10 MED ORDER — MUPIROCIN 2 % EX OINT
1.0000 | TOPICAL_OINTMENT | Freq: Two times a day (BID) | CUTANEOUS | Status: DC
  Administered 2021-12-10 – 2021-12-13 (×8): 1 via NASAL
  Filled 2021-12-10 (×3): qty 22

## 2021-12-10 MED ORDER — POTASSIUM CHLORIDE CRYS ER 20 MEQ PO TBCR
40.0000 meq | EXTENDED_RELEASE_TABLET | ORAL | Status: AC
  Administered 2021-12-10 (×2): 40 meq via ORAL
  Filled 2021-12-10 (×2): qty 2

## 2021-12-10 MED ORDER — CYCLOBENZAPRINE HCL 5 MG PO TABS
2.5000 mg | ORAL_TABLET | Freq: Once | ORAL | Status: AC
  Administered 2021-12-10: 2.5 mg via ORAL
  Filled 2021-12-10: qty 1

## 2021-12-10 MED ORDER — ORAL CARE MOUTH RINSE: 15.0000 mL | OROMUCOSAL | Status: DC | PRN

## 2021-12-10 MED ORDER — INSULIN GLARGINE-YFGN 100 UNIT/ML ~~LOC~~ SOLN
30.0000 [IU] | Freq: Every day | SUBCUTANEOUS | Status: DC
  Administered 2021-12-10: 30 [IU] via SUBCUTANEOUS
  Filled 2021-12-10 (×2): qty 0.3

## 2021-12-10 NOTE — Inpatient Diabetes Management (Signed)
Inpatient Diabetes Program Recommendations  AACE/ADA: New Consensus Statement on Inpatient Glycemic Control (2015)  Target Ranges:  Prepandial:   less than 140 mg/dL      Peak postprandial:   less than 180 mg/dL (1-2 hours)      Critically ill patients:  140 - 180 mg/dL   Lab Results  Component Value Date   GLUCAP 243 (H) 12/10/2021   HGBA1C 13.1 (H) 12/09/2021    Review of Glycemic Control  Latest Reference Range & Units 12/10/21 05:07 12/10/21 06:07 12/10/21 07:01 12/10/21 08:01 12/10/21 09:08  Glucose-Capillary 70 - 99 mg/dL 281 (H) 260 (H) 221 (H) 234 (H) 243 (H)  (H): Data is abnormally high Diabetes history: New onset DM Outpatient Diabetes medications: none Current orders for Inpatient glycemic control: IV insulin  Inpatient Diabetes Program Recommendations:    Continue with IV insulin as current rates continue to exceed 8 units/hr  Ordered: LWWDM, insulin starter kit, dietitian consult  Spoke with patient and support person regarding new onset DM.  Reviewed patient's current A1c of 13.1%. Explained what a A1c is and what it measures. Also reviewed goal A1c with patient, importance of good glucose control @ home, and blood sugar goals. Reviewed patho of DM, role of pancreas, DKA, differences between long acting vs short acting, survival skills, interventions, impact with CKD, vascular changes and co-morbidities. Patient will need a meter at discharge, Blood glucose kti #[id number] Reviewed recommended frequency for checking CBGs and when to call MD. Pt to make follow up appointment with PCP.  Admits to drinking juices. Reviewed alternatives, importance of incorporating protein, and foods that are higher in CHO.  Educated patient and spouse on insulin pen use at home. Reviewed contents of insulin flexpen starter kit. Reviewed all steps if insulin pen including attachment of needle, 2-unit air shot, dialing up dose, giving injection, removing needle, disposal of sharps, storage  of unused insulin, disposal of insulin etc. Patient able to provide successful return demonstration. Also reviewed troubleshooting with insulin pen. MD to give patient Rxs for insulin pens and insulin pen needles. Encouraged to begin practicing checking CBGs and self injections once transition begins. Patient has no questions at this time.   Thanks, Bronson Curb, MSN, RNC-OB Diabetes Coordinator [phone] (8a-5p)

## 2021-12-10 NOTE — Progress Notes (Addendum)
Anion gap closed on afternoon BMP. Dr Starla Link notified by this RN that insulin gtt was still running at 13 units when transition to SQ insulin was ordered. 30 units Semglee was given to patient at 1525 per orders. Insulin gtt and associated fluids were stopped at 1729. CBG currently 178. This RN will continue to carefully monitor patient's CBGs.

## 2021-12-10 NOTE — Progress Notes (Signed)
PROGRESS NOTE    Kevin Todd  [id number] DOB: Oct 01, 1975 DOA: 12/09/2021 PCP: [name], NP   Brief Narrative:  46 y.o. male with history of chronic kidney disease stage IV, polycystic kidney disease, hypertension presented with nausea, vomiting, blurred vision for few days.  On presentation, blood sugar was 1034 with anion gap of 18 and bicarb of 17.  He was started on IV fluids and insulin drip.  Assessment & Plan:   DKA New diagnosis of possible uncontrolled diabetes mellitus type 2 -Presented with blood sugar of 1034 with an anion gap of 18 and bicarb of 17 -Currently on a saline drip with BMP being monitored every 4 hours.  Blood sugars improving; anion gap 12 this morning.  Continue insulin drip; repeat BMP in the next 4 hours and if anion gap is improving, will switch to long-acting insulin. -A1c 13.1.  Diabetes coordinator consult -Advance diet to carb modified diet  Hyponatremia -Possibly from hyperglycemia.  Improving.  Sodium 134 currently.  Continue IV fluids.  Repeat a.m. labs  Hypokalemia -Replace.  Repeat a.m. labs  Acute kidney injury on chronic kidney disease stage IV Polycystic kidney disease -Follows up with Dr. Moshe Cipro as an outpatient.  Presented with creatinine of 4.6.  Creatinine improving to 3.58 this morning.  Possibly at baseline.  Repeat a.m. labs. -On tolvaptan and oral sodium and bicarbonate tablets at home which are being continued. -Lasix on hold  Hypertension -Monitor blood pressure.  Continue amlodipine, hydralazine, metoprolol  Morbid obesity -Outpatient follow-up   DVT prophylaxis: Heparin Code Status: Full Family Communication: None at bedside Disposition Plan: Status is: Inpatient Remains inpatient appropriate because: Of severity of illness  Consultants: None  Procedures: None  Antimicrobials: None   Subjective: Patient seen and examined at bedside.  Feels slightly better.  Currently not nauseous or vomiting.   Wants to advance his diet.  He denies worsening abdominal pain, chest pain or fever.  Objective: Vitals:   12/10/21 0800 12/10/21 0829 12/10/21 0900 12/10/21 0910  BP: (!) 147/68  138/67 138/67  Pulse:    78  Resp: 16  (!) 9 13  Temp:  98.1 F (36.7 C)    TempSrc:  Oral    SpO2: 99%   99%  Weight:      Height:        Intake/Output Summary (Last 24 hours) at 12/10/2021 0954 Last data filed at 12/10/2021 0919 Gross per 24 hour  Intake 4394.19 ml  Output 2900 ml  Net 1494.19 ml   Filed Weights   12/09/21 1638 12/09/21 2000  Weight: (!) 145.2 kg 135.3 kg    Examination:  General exam: Appears calm and comfortable.  Looks chronically ill and deconditioned.  Currently on room air. Respiratory system: Bilateral decreased breath sounds at bases with some scattered crackles Cardiovascular system: S1 & S2 heard, Rate controlled Gastrointestinal system: Abdomen is morbidly obese, distended, soft and nontender. Normal bowel sounds heard. Extremities: No cyanosis, clubbing, edema  Central nervous system: Awake, slow to respond, poor historian. No focal neurological deficits. Moving extremities Skin: No rashes, lesions or ulcers Psychiatry: Extremely flat affect.  No signs of agitation.  Data Reviewed: I have personally reviewed following labs and imaging studies  CBC: Recent Labs  Lab 12/09/21 1658 12/09/21 1704  WBC 8.1  --   HGB 13.3 13.9  HCT 36.6* 41.0  MCV 87.1  --   PLT 294  --    Basic Metabolic Panel: Recent Labs  Lab 12/09/21 1658 12/09/21  1704 12/09/21 2038 12/10/21 0236 12/10/21 0734  NA 116* 115* 127* 132* 134*  K 4.6 5.2* 4.5 3.2* 3.1*  CL 81* 87* 93* 97* 99  CO2 17*  --  19* 23 23  GLUCOSE 1,034* >700* 758* 418* 221*  BUN 42* 55* 40* 37* 35*  CREATININE 4.53* 4.60* 4.04* 3.76* 3.58*  CALCIUM 8.9  --  9.0 9.1 9.2   GFR: Estimated Creatinine Clearance: 34.7 mL/min (A) (by C-G formula based on SCr of 3.58 mg/dL (H)). Liver Function Tests: Recent  Labs  Lab 12/09/21 1658  AST 22  ALT 29  ALKPHOS 93  BILITOT 1.0  PROT 7.6  ALBUMIN 4.3   No results for input(s): "LIPASE", "AMYLASE" in the last 168 hours. No results for input(s): "AMMONIA" in the last 168 hours. Coagulation Profile: No results for input(s): "INR", "PROTIME" in the last 168 hours. Cardiac Enzymes: No results for input(s): "CKTOTAL", "CKMB", "CKMBINDEX", "TROPONINI" in the last 168 hours. BNP (last 3 results) No results for input(s): "PROBNP" in the last 8760 hours. HbA1C: Recent Labs    12/09/21 1658  HGBA1C 13.1*   CBG: Recent Labs  Lab 12/10/21 0507 12/10/21 0607 12/10/21 0701 12/10/21 0801 12/10/21 0908  GLUCAP 281* 260* 221* 234* 243*   Lipid Profile: No results for input(s): "CHOL", "HDL", "LDLCALC", "TRIG", "CHOLHDL", "LDLDIRECT" in the last 72 hours. Thyroid Function Tests: No results for input(s): "TSH", "T4TOTAL", "FREET4", "T3FREE", "THYROIDAB" in the last 72 hours. Anemia Panel: No results for input(s): "VITAMINB12", "FOLATE", "FERRITIN", "TIBC", "IRON", "RETICCTPCT" in the last 72 hours. Sepsis Labs: No results for input(s): "PROCALCITON", "LATICACIDVEN" in the last 168 hours.  Recent Results (from the past 240 hour(s))  MRSA Next Gen by PCR, Nasal     Status: Abnormal   Collection Time: 12/09/21  8:28 PM   Specimen: Nasal Mucosa; Nasal Swab  Result Value Ref Range Status   MRSA by PCR Next Gen DETECTED (A) NOT DETECTED Final    Comment: RESULT CALLED TO, READ BACK BY AND VERIFIED WITH: CLAPP,B. 12/10/21 @0003  BY SEEL,M. (NOTE) The GeneXpert MRSA Assay (FDA approved for NASAL specimens only), is one component of a comprehensive MRSA colonization surveillance program. It is not intended to diagnose MRSA infection nor to guide or monitor treatment for MRSA infections. Test performance is not FDA approved in patients less than 11 years old. Performed at [facility], [place] [address] [place], [place] [address]           Radiology Studies: No results found.      Scheduled Meds:  amLODipine  10 mg Oral QHS   Chlorhexidine Gluconate Cloth  6 each Topical Daily   heparin  5,000 Units Subcutaneous Q8H   hydrALAZINE  75 mg Oral Q8H   insulin starter kit- pen needles  1 kit Other Once   living well with diabetes book   Does not apply Once   metoprolol succinate  50 mg Oral Daily   mupirocin ointment  1 Application Nasal BID   sodium bicarbonate  650 mg Oral BID   Tolvaptan  30-90 mg Oral BID   Continuous Infusions:  dextrose 5% lactated ringers 125 mL/hr at 12/10/21 0919   insulin 8.5 Units/hr (12/10/21 0909)   lactated ringers Stopped (12/10/21 7867)          Aline August, MD [facility] 12/10/2021, 9:54 AM

## 2021-12-11 DIAGNOSIS — I10 Essential (primary) hypertension: Secondary | ICD-10-CM

## 2021-12-11 DIAGNOSIS — N184 Chronic kidney disease, stage 4 (severe): Secondary | ICD-10-CM | POA: Diagnosis not present

## 2021-12-11 DIAGNOSIS — N179 Acute kidney failure, unspecified: Secondary | ICD-10-CM | POA: Diagnosis not present

## 2021-12-11 DIAGNOSIS — E871 Hypo-osmolality and hyponatremia: Secondary | ICD-10-CM

## 2021-12-11 DIAGNOSIS — E1310 Other specified diabetes mellitus with ketoacidosis without coma: Secondary | ICD-10-CM | POA: Diagnosis not present

## 2021-12-11 LAB — CBC WITH DIFFERENTIAL/PLATELET
Abs Immature Granulocytes: 0.02 10*3/uL (ref 0.00–0.07)
Basophils Absolute: 0.1 10*3/uL (ref 0.0–0.1)
Basophils Relative: 1 %
Eosinophils Absolute: 0.1 10*3/uL (ref 0.0–0.5)
Eosinophils Relative: 2 %
HCT: 33.8 % — ABNORMAL LOW (ref 39.0–52.0)
Hemoglobin: 11.7 g/dL — ABNORMAL LOW (ref 13.0–17.0)
Immature Granulocytes: 0 %
Lymphocytes Relative: 20 %
Lymphs Abs: 1.3 10*3/uL (ref 0.7–4.0)
MCH: 31.7 pg (ref 26.0–34.0)
MCHC: 34.6 g/dL (ref 30.0–36.0)
MCV: 91.6 fL (ref 80.0–100.0)
Monocytes Absolute: 0.4 10*3/uL (ref 0.1–1.0)
Monocytes Relative: 6 %
Neutro Abs: 5 10*3/uL (ref 1.7–7.7)
Neutrophils Relative %: 71 %
Platelets: 245 10*3/uL (ref 150–400)
RBC: 3.69 MIL/uL — ABNORMAL LOW (ref 4.22–5.81)
RDW: 13.4 % (ref 11.5–15.5)
WBC: 6.9 10*3/uL (ref 4.0–10.5)
nRBC: 0 % (ref 0.0–0.2)

## 2021-12-11 LAB — MAGNESIUM: Magnesium: 2.6 mg/dL — ABNORMAL HIGH (ref 1.7–2.4)

## 2021-12-11 LAB — COMPREHENSIVE METABOLIC PANEL
ALT: 44 U/L (ref 0–44)
AST: 37 U/L (ref 15–41)
Albumin: 3.4 g/dL — ABNORMAL LOW (ref 3.5–5.0)
Alkaline Phosphatase: 67 U/L (ref 38–126)
Anion gap: 12 (ref 5–15)
BUN: 34 mg/dL — ABNORMAL HIGH (ref 6–20)
CO2: 18 mmol/L — ABNORMAL LOW (ref 22–32)
Calcium: 9.1 mg/dL (ref 8.9–10.3)
Chloride: 105 mmol/L (ref 98–111)
Creatinine, Ser: 3.78 mg/dL — ABNORMAL HIGH (ref 0.61–1.24)
GFR, Estimated: 19 mL/min — ABNORMAL LOW (ref >60)
Glucose, Bld: 390 mg/dL — ABNORMAL HIGH (ref 70–99)
Potassium: 4.3 mmol/L (ref 3.5–5.1)
Sodium: 135 mmol/L (ref 135–145)
Total Bilirubin: 0.9 mg/dL (ref 0.3–1.2)
Total Protein: 5.9 g/dL — ABNORMAL LOW (ref 6.5–8.1)

## 2021-12-11 LAB — BASIC METABOLIC PANEL
Anion gap: 10 (ref 5–15)
Anion gap: 10 (ref 5–15)
BUN: 36 mg/dL — ABNORMAL HIGH (ref 6–20)
BUN: 35 mg/dL — ABNORMAL HIGH (ref 6–20)
CO2: 19 mmol/L — ABNORMAL LOW (ref 22–32)
CO2: 19 mmol/L — ABNORMAL LOW (ref 22–32)
Calcium: 9.1 mg/dL (ref 8.9–10.3)
Calcium: 9.1 mg/dL (ref 8.9–10.3)
Chloride: 105 mmol/L (ref 98–111)
Chloride: 110 mmol/L (ref 98–111)
Creatinine, Ser: 3.81 mg/dL — ABNORMAL HIGH (ref 0.61–1.24)
Creatinine, Ser: 3.74 mg/dL — ABNORMAL HIGH (ref 0.61–1.24)
GFR, Estimated: 19 mL/min — ABNORMAL LOW (ref >60)
GFR, Estimated: 19 mL/min — ABNORMAL LOW (ref >60)
Glucose, Bld: 449 mg/dL — ABNORMAL HIGH (ref 70–99)
Glucose, Bld: 343 mg/dL — ABNORMAL HIGH (ref 70–99)
Potassium: 4.6 mmol/L (ref 3.5–5.1)
Potassium: 4.3 mmol/L (ref 3.5–5.1)
Sodium: 134 mmol/L — ABNORMAL LOW (ref 135–145)
Sodium: 139 mmol/L (ref 135–145)

## 2021-12-11 LAB — GLUCOSE, CAPILLARY
Glucose-Capillary: 435 mg/dL — ABNORMAL HIGH (ref 70–99)
Glucose-Capillary: 436 mg/dL — ABNORMAL HIGH (ref 70–99)
Glucose-Capillary: 331 mg/dL — ABNORMAL HIGH (ref 70–99)
Glucose-Capillary: 305 mg/dL — ABNORMAL HIGH (ref 70–99)

## 2021-12-11 MED ORDER — INSULIN GLARGINE-YFGN 100 UNIT/ML ~~LOC~~ SOLN
35.0000 [IU] | Freq: Every day | SUBCUTANEOUS | Status: DC
  Administered 2021-12-11: 35 [IU] via SUBCUTANEOUS
  Filled 2021-12-11 (×2): qty 0.35

## 2021-12-11 MED ORDER — INSULIN ASPART 100 UNIT/ML IJ SOLN
0.0000 [IU] | Freq: Every day | INTRAMUSCULAR | Status: DC
  Administered 2021-12-11 – 2021-12-12 (×2): 4 [IU] via SUBCUTANEOUS

## 2021-12-11 MED ORDER — INSULIN ASPART 100 UNIT/ML IJ SOLN
24.0000 [IU] | Freq: Once | INTRAMUSCULAR | Status: AC
  Administered 2021-12-11: 24 [IU] via SUBCUTANEOUS

## 2021-12-11 MED ORDER — INSULIN ASPART 100 UNIT/ML IJ SOLN
0.0000 [IU] | Freq: Three times a day (TID) | INTRAMUSCULAR | Status: DC
  Administered 2021-12-11 – 2021-12-12 (×3): 15 [IU] via SUBCUTANEOUS
  Administered 2021-12-12 – 2021-12-13 (×2): 20 [IU] via SUBCUTANEOUS
  Administered 2021-12-13: 15 [IU] via SUBCUTANEOUS

## 2021-12-11 MED ORDER — SODIUM CHLORIDE 0.9 % IV SOLN: INTRAVENOUS | Status: DC

## 2021-12-11 MED ORDER — INSULIN ASPART 100 UNIT/ML IJ SOLN
22.0000 [IU] | Freq: Once | INTRAMUSCULAR | Status: AC
  Administered 2021-12-11: 22 [IU] via SUBCUTANEOUS

## 2021-12-11 MED ORDER — INSULIN ASPART 100 UNIT/ML IJ SOLN
5.0000 [IU] | Freq: Three times a day (TID) | INTRAMUSCULAR | Status: DC
  Administered 2021-12-11 (×2): 5 [IU] via SUBCUTANEOUS

## 2021-12-11 NOTE — Progress Notes (Signed)
CBG 435, MD notified, new orders for one time dose 22 units insulin aspart/novolog received.

## 2021-12-11 NOTE — Progress Notes (Signed)
Phlebotomy unable to draw blood, second phlebotomist on the way to attempt stick for blood draw.

## 2021-12-11 NOTE — Progress Notes (Addendum)
CBG remains elevated at 436. MD notified. New orders received. Hold transfer until after BMP results.

## 2021-12-11 NOTE — Discharge Instructions (Signed)

## 2021-12-11 NOTE — Progress Notes (Signed)
NUTRITION NOTE RD working remotely.   RD consulted for nutrition education regarding new diagnosis of diabetes.   Lab Results  Component Value Date   HGBA1C 13.1 (H) 12/09/2021    Diet advanced to CLD on 7/28 at 2115 and to Carb Modified yesterday at 1004. He is noted to have consumed 60% of breakfast on CLD yesterday.   Patient has not been assessed by a [facility] RD at any time in the past.  He has [employer] so will enter referral for outpatient diet education at [facility]  Entered Carbohydrate Counting for People with Diabetes handout in AVS.   If patient remains inpatient through the beginning of the week and staffing allows, would benefit from in-person education.   Expect fair to good compliance given new diagnosis.  Body mass index is 44.92 kg/m. Pt meets criteria for morbid obesity based on current BMI.  Labs and medications reviewed.   No further nutrition interventions warranted at this time. RD contact information provided. If additional nutrition issues arise, please re-consult RD.      Kevin Matin, MS, RD, LDN, [place] Registered Dietitian II Inpatient Clinical Nutrition RD pager # and on-call/weekend pager # available in [facility]

## 2021-12-11 NOTE — Progress Notes (Signed)
PROGRESS NOTE    Kevin Todd  JME:[id number] DOB: July 30, 1975 DOA: 12/09/2021 PCP: [name], NP   Brief Narrative:  46 y.o. male with history of chronic kidney disease stage IV, polycystic kidney disease, hypertension presented with nausea, vomiting, blurred vision for few days.  On presentation, blood sugar was 1034 with anion gap of 18 and bicarb of 17.  He was started on IV fluids and insulin drip.  Assessment & Plan:   DKA New diagnosis of possible uncontrolled diabetes mellitus type 2 -Presented with blood sugar of 1034 with an anion gap of 18 and bicarb of 17 -Insulin drip has been transitioned to long-acting insulin.  Blood sugar still elevated.  Increase Lantus to 35 units daily.  Add NovoLog with meals.  Continue CBGs with SSI. -A1c 13.1.  Diabetes coordinator consult -Tolerating carb modified diet.  Hyponatremia -Possibly from hyperglycemia.  Improving.  Sodium 135 currently.  Treated with IV fluids.  Repeat a.m. labs  Hypokalemia -Improved.    Acute kidney injury on chronic kidney disease stage IV Polycystic kidney disease -Follows up with Dr. Moshe Cipro as an outpatient.  Presented with creatinine of 4.6.  Creatinine 3.78 this morning.  Will resume normal saline at 75 cc an hour.  Repeat a.m. labs. -On tolvaptan and oral sodium and bicarbonate tablets at home which are being continued. -Lasix on hold  Hypertension -Monitor blood pressure.  Continue amlodipine, hydralazine, metoprolol  Morbid obesity -Outpatient follow-up   DVT prophylaxis: Heparin Code Status: Full Family Communication: Fianc at bedside Disposition Plan: Status is: Inpatient Remains inpatient appropriate because: Of severity of illness  Consultants: None  Procedures: None  Antimicrobials: None   Subjective: Patient seen and examined at bedside.  Denies worsening abdominal pain, vomiting.  Did not feel like eating much last night.  No fever or chest pain or shortness of  breath reported. Objective: Vitals:   12/11/21 0600 12/11/21 0831 12/11/21 0849 12/11/21 0900  BP:    (!) 161/72  Pulse:      Resp: '16 12  13  '$ Temp:   97.7 F (36.5 C)   TempSrc:   Oral   SpO2:  100%    Weight:      Height:        Intake/Output Summary (Last 24 hours) at 12/11/2021 0932 Last data filed at 12/11/2021 0700 Gross per 24 hour  Intake 1140.86 ml  Output --  Net 1140.86 ml    Filed Weights   12/09/21 1638 12/09/21 2000 12/11/21 0500  Weight: (!) 145.2 kg 135.3 kg 134 kg    Examination:  General: On room air.  No distress ENT/neck: No thyromegaly.  JVD is not elevated  respiratory: Decreased breath sounds at bases bilaterally with some crackles; no wheezing  CVS: S1-S2 heard, rate controlled currently Abdominal: Soft, morbidly obese, nontender, slightly distended; no organomegaly, bowel sounds are heard Extremities: Trace lower extremity edema; no cyanosis  CNS: Awake and alert.  Very slow to respond.  No focal neurologic deficit.  Moves extremities Lymph: No obvious lymphadenopathy Skin: No obvious ecchymosis/lesions  psych: Flat affect.  No signs of agitation. musculoskeletal: No obvious joint swelling/deformity   Data Reviewed: I have personally reviewed following labs and imaging studies  CBC: Recent Labs  Lab 12/09/21 1658 12/09/21 1704 12/11/21 0801  WBC 8.1  --  6.9  NEUTROABS  --   --  5.0  HGB 13.3 13.9 11.7*  HCT 36.6* 41.0 33.8*  MCV 87.1  --  91.6  PLT 294  --  833    Basic Metabolic Panel: Recent Labs  Lab 12/10/21 0236 12/10/21 0734 12/10/21 1313 12/10/21 1914 12/11/21 0307  NA 132* 134* 134* 134* 135  K 3.2* 3.1* 3.0* 3.6 4.3  CL 97* 99 103 102 105  CO2 '23 23 22 22 '$ 18*  GLUCOSE 418* 221* 276* 288* 390*  BUN 37* 35* 34* 36* 34*  CREATININE 3.76* 3.58* 3.44* 3.64* 3.78*  CALCIUM 9.1 9.2 9.2 8.8* 9.1  MG  --   --   --   --  2.6*    GFR: Estimated Creatinine Clearance: 32.7 mL/min (A) (by C-G formula based on SCr of  3.78 mg/dL (H)). Liver Function Tests: Recent Labs  Lab 12/09/21 1658 12/11/21 0307  AST 22 37  ALT 29 44  ALKPHOS 93 67  BILITOT 1.0 0.9  PROT 7.6 5.9*  ALBUMIN 4.3 3.4*    No results for input(s): "LIPASE", "AMYLASE" in the last 168 hours. No results for input(s): "AMMONIA" in the last 168 hours. Coagulation Profile: No results for input(s): "INR", "PROTIME" in the last 168 hours. Cardiac Enzymes: No results for input(s): "CKTOTAL", "CKMB", "CKMBINDEX", "TROPONINI" in the last 168 hours. BNP (last 3 results) No results for input(s): "PROBNP" in the last 8760 hours. HbA1C: Recent Labs    12/09/21 1658  HGBA1C 13.1*    CBG: Recent Labs  Lab 12/10/21 1522 12/10/21 1620 12/10/21 1727 12/10/21 2125 12/11/21 0816  GLUCAP 242* 209* 178* 307* 435*    Lipid Profile: No results for input(s): "CHOL", "HDL", "LDLCALC", "TRIG", "CHOLHDL", "LDLDIRECT" in the last 72 hours. Thyroid Function Tests: No results for input(s): "TSH", "T4TOTAL", "FREET4", "T3FREE", "THYROIDAB" in the last 72 hours. Anemia Panel: No results for input(s): "VITAMINB12", "FOLATE", "FERRITIN", "TIBC", "IRON", "RETICCTPCT" in the last 72 hours. Sepsis Labs: No results for input(s): "PROCALCITON", "LATICACIDVEN" in the last 168 hours.  Recent Results (from the past 240 hour(s))  MRSA Next Gen by PCR, Nasal     Status: Abnormal   Collection Time: 12/09/21  8:28 PM   Specimen: Nasal Mucosa; Nasal Swab  Result Value Ref Range Status   MRSA by PCR Next Gen DETECTED (A) NOT DETECTED Final    Comment: RESULT CALLED TO, READ BACK BY AND VERIFIED WITH: CLAPP,B. 12/10/21 '@0003'$  BY SEEL,M. (NOTE) The GeneXpert MRSA Assay (FDA approved for NASAL specimens only), is one component of a comprehensive MRSA colonization surveillance program. It is not intended to diagnose MRSA infection nor to guide or monitor treatment for MRSA infections. Test performance is not FDA approved in patients less than 32  years old. Performed at [facility], [place] [address] [place],  [address]          Radiology Studies: No results found.      Scheduled Meds:  amLODipine  10 mg Oral QHS   Chlorhexidine Gluconate Cloth  6 each Topical Daily   heparin  5,000 Units Subcutaneous Q8H   hydrALAZINE  75 mg Oral Q8H   insulin aspart  0-20 Units Subcutaneous TID WC   insulin glargine-yfgn  35 Units Subcutaneous Daily   metoprolol succinate  50 mg Oral Daily   mupirocin ointment  1 Application Nasal BID   sodium bicarbonate  650 mg Oral BID   Tolvaptan  30-90 mg Oral BID   Continuous Infusions:          Aline August, MD [facility] 12/11/2021, 9:32 AM

## 2021-12-12 DIAGNOSIS — N179 Acute kidney failure, unspecified: Secondary | ICD-10-CM | POA: Diagnosis not present

## 2021-12-12 DIAGNOSIS — E1310 Other specified diabetes mellitus with ketoacidosis without coma: Secondary | ICD-10-CM | POA: Diagnosis not present

## 2021-12-12 DIAGNOSIS — I10 Essential (primary) hypertension: Secondary | ICD-10-CM | POA: Diagnosis not present

## 2021-12-12 LAB — BASIC METABOLIC PANEL
Anion gap: 9 (ref 5–15)
BUN: 32 mg/dL — ABNORMAL HIGH (ref 6–20)
CO2: 19 mmol/L — ABNORMAL LOW (ref 22–32)
Calcium: 9 mg/dL (ref 8.9–10.3)
Chloride: 107 mmol/L (ref 98–111)
Creatinine, Ser: 3.41 mg/dL — ABNORMAL HIGH (ref 0.61–1.24)
GFR, Estimated: 22 mL/min — ABNORMAL LOW (ref >60)
Glucose, Bld: 354 mg/dL — ABNORMAL HIGH (ref 70–99)
Potassium: 4 mmol/L (ref 3.5–5.1)
Sodium: 135 mmol/L (ref 135–145)

## 2021-12-12 LAB — GLUCOSE, CAPILLARY
Glucose-Capillary: 380 mg/dL — ABNORMAL HIGH (ref 70–99)
Glucose-Capillary: 335 mg/dL — ABNORMAL HIGH (ref 70–99)
Glucose-Capillary: 337 mg/dL — ABNORMAL HIGH (ref 70–99)
Glucose-Capillary: 317 mg/dL — ABNORMAL HIGH (ref 70–99)

## 2021-12-12 LAB — MAGNESIUM: Magnesium: 2.4 mg/dL (ref 1.7–2.4)

## 2021-12-12 MED ORDER — INSULIN GLARGINE-YFGN 100 UNIT/ML ~~LOC~~ SOLN
40.0000 [IU] | Freq: Every day | SUBCUTANEOUS | Status: DC
  Administered 2021-12-12: 40 [IU] via SUBCUTANEOUS
  Filled 2021-12-12 (×2): qty 0.4

## 2021-12-12 MED ORDER — INSULIN ASPART 100 UNIT/ML IJ SOLN
7.0000 [IU] | Freq: Three times a day (TID) | INTRAMUSCULAR | Status: DC
Start: 2021-12-12 — End: 2021-12-13
  Administered 2021-12-12 (×3): 7 [IU] via SUBCUTANEOUS

## 2021-12-12 NOTE — Progress Notes (Signed)
PROGRESS NOTE    Kevin Todd  BJY:[id number] DOB: 11/10/1975 DOA: 12/09/2021 PCP: [name], NP   Brief Narrative:  46 y.o. male with history of chronic kidney disease stage IV, polycystic kidney disease, hypertension presented with nausea, vomiting, blurred vision for few days.  On presentation, blood sugar was 1034 with anion gap of 18 and bicarb of 17.  He was started on IV fluids and insulin drip.  Assessment & Plan:   DKA New diagnosis of possible uncontrolled diabetes mellitus type 2 -Presented with blood sugar of 1034 with an anion gap of 18 and bicarb of 17 -Insulin drip has been transitioned to long-acting insulin.  Blood sugar still elevated.  Increase Lantus to 40 units daily.  Increase NovoLog with meals to 70 units.  Continue CBGs with SSI. -A1c 13.1.  Diabetes coordinator consult -Tolerating carb modified diet.  Hyponatremia -Possibly from hyperglycemia. Treated with IV fluids.  Improved.  Repeat a.m. labs  Hypokalemia -Improved.    Acute kidney injury on chronic kidney disease stage IV Polycystic kidney disease -Follows up with Dr. Moshe Cipro as an outpatient.  Presented with creatinine of 4.6.  Creatinine 3.41 this morning.  DC IV fluids today and monitor.  Repeat a.m. labs. -On tolvaptan and oral sodium and bicarbonate tablets at home which are being continued. -Lasix on hold  Hypertension -Monitor blood pressure.  Continue amlodipine, hydralazine, metoprolol  Morbid obesity -Outpatient follow-up   DVT prophylaxis: Heparin Code Status: Full Family Communication: Fianc at bedside Disposition Plan: Status is: Inpatient Remains inpatient appropriate because: Of severity of illness.  Possible discharge tomorrow if blood sugars improve.  Consultants: None  Procedures: None  Antimicrobials: None   Subjective: Patient seen and examined at bedside.  Poor historian, does not participate in conversation much.  No overnight fever, vomiting, chest  pain reported.   Objective: Vitals:   12/12/21 0416 12/12/21 0500 12/12/21 0600 12/12/21 0800  BP: 138/78  (!) 141/68   Pulse: 69 61 74   Resp: '15 15 18   '$ Temp:    97.7 F (36.5 C)  TempSrc:    Oral  SpO2: 100% 97% 100%   Weight: 133.5 kg     Height:        Intake/Output Summary (Last 24 hours) at 12/12/2021 1002 Last data filed at 12/12/2021 0611 Gross per 24 hour  Intake 1397.12 ml  Output --  Net 1397.12 ml    Filed Weights   12/09/21 2000 12/11/21 0500 12/12/21 0416  Weight: 135.3 kg 134 kg 133.5 kg    Examination:  General: No acute distress.  Still on room air.   ENT/neck: No palpable neck masses or JVD elevation noted  respiratory: Bilateral decreased breath sounds at bases with some scattered crackles  CVS: Mostly rate controlled; S1 and S2 heard Abdominal: Soft, morbidly obese, nontender, still has some distention; no organomegaly, bowel sounds heard normally  extremities: No clubbing; mild lower extremity edema present bilaterally  CNS: Alert and oriented.  Still very slow to respond.  No focal neurologic deficit.  Able to move extremities Lymph: No palpable lymphadenopathy noted  skin: No obvious rashes or petechiae  psych: Very flat affect.  Does not participate in conversation much.  Not agitated.  Musculoskeletal: No obvious joint swelling/deformity   Data Reviewed: I have personally reviewed following labs and imaging studies  CBC: Recent Labs  Lab 12/09/21 1658 12/09/21 1704 12/11/21 0801  WBC 8.1  --  6.9  NEUTROABS  --   --  5.0  HGB 13.3 13.9 11.7*  HCT 36.6* 41.0 33.8*  MCV 87.1  --  91.6  PLT 294  --  734    Basic Metabolic Panel: Recent Labs  Lab 12/10/21 1914 12/11/21 0307 12/11/21 1248 12/11/21 1909 12/12/21 0542  NA 134* 135 134* 139 135  K 3.6 4.3 4.6 4.3 4.0  CL 102 105 105 110 107  CO2 22 18* 19* 19* 19*  GLUCOSE 288* 390* 449* 343* 354*  BUN 36* 34* 36* 35* 32*  CREATININE 3.64* 3.78* 3.81* 3.74* 3.41*  CALCIUM 8.8*  9.1 9.1 9.1 9.0  MG  --  2.6*  --   --  2.4    GFR: Estimated Creatinine Clearance: 36.1 mL/min (A) (by C-G formula based on SCr of 3.41 mg/dL (H)). Liver Function Tests: Recent Labs  Lab 12/09/21 1658 12/11/21 0307  AST 22 37  ALT 29 44  ALKPHOS 93 67  BILITOT 1.0 0.9  PROT 7.6 5.9*  ALBUMIN 4.3 3.4*    No results for input(s): "LIPASE", "AMYLASE" in the last 168 hours. No results for input(s): "AMMONIA" in the last 168 hours. Coagulation Profile: No results for input(s): "INR", "PROTIME" in the last 168 hours. Cardiac Enzymes: No results for input(s): "CKTOTAL", "CKMB", "CKMBINDEX", "TROPONINI" in the last 168 hours. BNP (last 3 results) No results for input(s): "PROBNP" in the last 8760 hours. HbA1C: Recent Labs    12/09/21 1658  HGBA1C 13.1*    CBG: Recent Labs  Lab 12/11/21 0816 12/11/21 1108 12/11/21 1654 12/11/21 2108 12/12/21 0755  GLUCAP 435* 436* 331* 305* 380*    Lipid Profile: No results for input(s): "CHOL", "HDL", "LDLCALC", "TRIG", "CHOLHDL", "LDLDIRECT" in the last 72 hours. Thyroid Function Tests: No results for input(s): "TSH", "T4TOTAL", "FREET4", "T3FREE", "THYROIDAB" in the last 72 hours. Anemia Panel: No results for input(s): "VITAMINB12", "FOLATE", "FERRITIN", "TIBC", "IRON", "RETICCTPCT" in the last 72 hours. Sepsis Labs: No results for input(s): "PROCALCITON", "LATICACIDVEN" in the last 168 hours.  Recent Results (from the past 240 hour(s))  MRSA Next Gen by PCR, Nasal     Status: Abnormal   Collection Time: 12/09/21  8:28 PM   Specimen: Nasal Mucosa; Nasal Swab  Result Value Ref Range Status   MRSA by PCR Next Gen DETECTED (A) NOT DETECTED Final    Comment: RESULT CALLED TO, READ BACK BY AND VERIFIED WITH: CLAPP,B. 12/10/21 '@0003'$  BY SEEL,M. (NOTE) The GeneXpert MRSA Assay (FDA approved for NASAL specimens only), is one component of a comprehensive MRSA colonization surveillance program. It is not intended to diagnose MRSA  infection nor to guide or monitor treatment for MRSA infections. Test performance is not FDA approved in patients less than 41 years old. Performed at [facility], [place] [address], [place], [place] [address]          Radiology Studies: No results found.      Scheduled Meds:  amLODipine  10 mg Oral QHS   Chlorhexidine Gluconate Cloth  6 each Topical Daily   heparin  5,000 Units Subcutaneous Q8H   hydrALAZINE  75 mg Oral Q8H   insulin aspart  0-20 Units Subcutaneous TID WC   insulin aspart  0-5 Units Subcutaneous QHS   insulin aspart  7 Units Subcutaneous TID WC   insulin glargine-yfgn  40 Units Subcutaneous Daily   metoprolol succinate  50 mg Oral Daily   mupirocin ointment  1 Application Nasal BID   sodium bicarbonate  650 mg Oral BID   Tolvaptan  30-90 mg Oral  BID   Continuous Infusions:  sodium chloride 75 mL/hr at 12/12/21 4370           Aline August, MD [facility] 12/12/2021, 10:02 AM

## 2021-12-12 NOTE — Inpatient Diabetes Management (Signed)
Inpatient Diabetes Program Recommendations  AACE/ADA: New Consensus Statement on Inpatient Glycemic Control (2015)  Target Ranges:  Prepandial:   less than 140 mg/dL      Peak postprandial:   less than 180 mg/dL (1-2 hours)      Critically ill patients:  140 - 180 mg/dL   Lab Results  Component Value Date   GLUCAP 335 (H) 12/12/2021   HGBA1C 13.1 (H) 12/09/2021    Review of Glycemic Control  Diabetes history: New onset DM2 Current orders for Inpatient glycemic control: Semglee 40 units qd, Novolog 7 units tid meal coverage, Novolog 0-20 units tid, 0-5 units hs Inpatient Diabetes Program Recommendations:   Educated patient and mom on insulin pen use at home. Reviewed contents of insulin flexpen starter kit. Reviewed all steps if insulin pen including attachment of needle, 2-unit air shot, dialing up dose, giving injection, removing needle, disposal of sharps, storage of unused insulin, disposal of insulin etc. Patient able to provide successful return demonstration. Also reviewed troubleshooting with insulin pen. MD to give patient Rxs for insulin pens and insulin pen needles.   Reviewed nutrition basics of limiting carbohydrates in drinks and foods and discussed amount of carbohydrates per meal that is recommended. Patient is limited walking @ this time, but discussed other options for movement and exercise as long as allowed by physician.  Thank you, Nani Gasser. Song Garris, RN, MSN, CDE  Diabetes Coordinator Inpatient Glycemic Control Team Team Pager [phone] (8am-5pm) 12/12/2021 1:23 PM

## 2021-12-12 NOTE — Progress Notes (Signed)
  Transition of Care [facility]) Screening Note   Patient Details  Name: BLAIZE EPPLE Date of Birth: January 12, 1976   Transition of Care [facility]) CM/SW Contact:    Lennart Pall, LCSW Phone Number: 12/12/2021, 11:34 AM    Transition of Care Department [facility]) has reviewed patient and no TOC needs have been identified at this time. We will continue to monitor patient advancement through interdisciplinary progression rounds. If new patient transition needs arise, please place a TOC consult.

## 2021-12-13 DIAGNOSIS — I10 Essential (primary) hypertension: Secondary | ICD-10-CM | POA: Diagnosis not present

## 2021-12-13 DIAGNOSIS — N179 Acute kidney failure, unspecified: Secondary | ICD-10-CM | POA: Diagnosis not present

## 2021-12-13 DIAGNOSIS — N184 Chronic kidney disease, stage 4 (severe): Secondary | ICD-10-CM | POA: Diagnosis not present

## 2021-12-13 DIAGNOSIS — E1310 Other specified diabetes mellitus with ketoacidosis without coma: Secondary | ICD-10-CM | POA: Diagnosis not present

## 2021-12-13 LAB — MAGNESIUM: Magnesium: 2.2 mg/dL (ref 1.7–2.4)

## 2021-12-13 LAB — BASIC METABOLIC PANEL
Anion gap: 9 (ref 5–15)
BUN: 26 mg/dL — ABNORMAL HIGH (ref 6–20)
CO2: 20 mmol/L — ABNORMAL LOW (ref 22–32)
Calcium: 8.8 mg/dL — ABNORMAL LOW (ref 8.9–10.3)
Chloride: 108 mmol/L (ref 98–111)
Creatinine, Ser: 3.15 mg/dL — ABNORMAL HIGH (ref 0.61–1.24)
GFR, Estimated: 24 mL/min — ABNORMAL LOW (ref >60)
Glucose, Bld: 253 mg/dL — ABNORMAL HIGH (ref 70–99)
Potassium: 3.8 mmol/L (ref 3.5–5.1)
Sodium: 137 mmol/L (ref 135–145)

## 2021-12-13 LAB — GLUCOSE, CAPILLARY
Glucose-Capillary: 322 mg/dL — ABNORMAL HIGH (ref 70–99)
Glucose-Capillary: 361 mg/dL — ABNORMAL HIGH (ref 70–99)

## 2021-12-13 MED ORDER — BLOOD GLUCOSE MONITOR KIT: PACK | 0 refills | Status: DC

## 2021-12-13 MED ORDER — NOVOLOG FLEXPEN 100 UNIT/ML ~~LOC~~ SOPN
10.0000 [IU] | PEN_INJECTOR | Freq: Three times a day (TID) | SUBCUTANEOUS | 0 refills | Status: DC
Start: 2021-12-13 — End: 2021-12-21

## 2021-12-13 MED ORDER — INSULIN GLARGINE-YFGN 100 UNIT/ML ~~LOC~~ SOLN
45.0000 [IU] | Freq: Every day | SUBCUTANEOUS | Status: DC
  Administered 2021-12-13: 45 [IU] via SUBCUTANEOUS
  Filled 2021-12-13: qty 0.45

## 2021-12-13 MED ORDER — INSULIN PEN NEEDLE 32G X 4 MM MISC
0 refills | Status: DC
Start: 2021-12-13 — End: 2022-02-13

## 2021-12-13 MED ORDER — BASAGLAR KWIKPEN 100 UNIT/ML ~~LOC~~ SOPN: 50.0000 [IU] | PEN_INJECTOR | Freq: Every day | SUBCUTANEOUS | 0 refills | Status: DC

## 2021-12-13 MED ORDER — INSULIN ASPART 100 UNIT/ML IJ SOLN
10.0000 [IU] | Freq: Three times a day (TID) | INTRAMUSCULAR | Status: DC
  Administered 2021-12-13 (×2): 10 [IU] via SUBCUTANEOUS

## 2021-12-13 NOTE — Progress Notes (Signed)
Inpatient Diabetes Program Recommendations  AACE/ADA: New Consensus Statement on Inpatient Glycemic Control (2015)  Target Ranges:  Prepandial:   less than 140 mg/dL      Peak postprandial:   less than 180 mg/dL (1-2 hours)      Critically ill patients:  140 - 180 mg/dL   Lab Results  Component Value Date   GLUCAP 322 (H) 12/13/2021   HGBA1C 13.1 (H) 12/09/2021    Review of Glycemic Control  Latest Reference Range & Units 12/12/21 12:22 12/12/21 16:36 12/12/21 20:24 12/13/21 07:36  Glucose-Capillary 70 - 99 mg/dL 335 (H) 337 (H) 317 (H) 322 (H)   Diabetes history: DM 2- new diagnosis  Outpatient Diabetes medications:  None Current orders for Inpatient glycemic control:  Novolog resistant tid with meals and HS Semglee 45 units daily Novolog 10 units tid with meals Inpatient Diabetes Program Recommendations:    Consider increasing basal insulin further.  Likely would benefit from the addition of GLP-1 weekly injection as well.  Needs close f/u for glucose management.   Thanks,  Adah Perl, RN, BC-ADM Inpatient Diabetes Coordinator Pager [phone]  (8a-5p)

## 2021-12-13 NOTE — Progress Notes (Signed)
PT Cancellation Note  Patient Details Name: Kevin Todd MRN: [id number] DOB: [birth year]   Cancelled Treatment:    Reason Eval/Treat Not Completed: PT screened, no needs identified, will sign off (No needs for skilled PT, pt mobilixing in room/on unit independently. Wills sign off at this time.)    Gwynneth Albright PT, DPT Acute Rehabilitation Services Office [phone] Pager [phone]  12/13/21 10:07 AM

## 2021-12-13 NOTE — Discharge Summary (Signed)
Physician Discharge Summary  Kevin Todd EPP:[id number] DOB: 1975-06-22 DOA: 12/09/2021  PCP: [name], NP  Admit date: 12/09/2021 Discharge date: 12/13/2021  Admitted From: Home Disposition: Home  Recommendations for Outpatient Follow-up:  Follow up with PCP in 1 week with repeat CBC/BMP Comply with medications and follow-up Outpatient follow-up with nephrology Follow up in ED if symptoms worsen or new appear   Home Health: No Equipment/Devices: None  Discharge Condition: Stable CODE STATUS: Full Diet recommendation: Heart healthy  Brief/Interim Summary: 46 y.o. male with history of chronic kidney disease stage IV, polycystic kidney disease, hypertension presented with nausea, vomiting, blurred vision for few days.  On presentation, blood sugar was 1034 with anion gap of 18 and bicarb of 17.  He was started on IV fluids and insulin drip.  During the hospitalization, he has been transitioned to long-acting insulin.  Blood sugars are on the high side but improving.  He is currently tolerating diet.  He has been evaluated by diabetes coordinator.  He will be discharged home today with long-acting and short-acting insulin.  Outpatient follow-up with PCP.    Discharge Diagnoses:   DKA New diagnosis of possible uncontrolled diabetes mellitus type 2 -Presented with blood sugar of 1034 with an anion gap of 18 and bicarb of 17 -Insulin drip has been transitioned to long-acting insulin.  Blood sugars still elevated but improving. -A1c 13.1.  Diabetes coordinator evaluated the patient. -Tolerating carb modified diet. -Currently feels okay to go home today.  Discharge patient home on long-acting insulin 50 units daily and NovoLog 10 units 3 times daily with meals.  Continue carb modified diet at home.  Patient will need very close follow-up with PCP.   Hyponatremia -Possibly from hyperglycemia. Treated with IV fluids.  Improved.  Outpatient follow-up  Hypokalemia -Improved.     Acute kidney injury on chronic kidney disease stage IV Polycystic kidney disease -Follows up with Dr. Moshe Cipro as an outpatient.  Presented with creatinine of 4.6.  Creatinine 3.15 this morning.  Outpatient follow-up with nephrology.  Treated with IV fluids and discontinued. -On tolvaptan and oral sodium and bicarbonate tablets at home which are being continued. -Lasix to remain on hold till reevaluation by Dr. Moshe Cipro.  Hypertension -Monitor blood pressure.  Continue amlodipine, hydralazine, metoprolol  Morbid obesity -Outpatient follow-up  Discharge Instructions  Discharge Instructions     Amb Referral to Nutrition and Diabetic Education   Complete by: As directed    Diet - low sodium heart healthy   Complete by: As directed    Diet Carb Modified   Complete by: As directed    Increase activity slowly   Complete by: As directed       Allergies as of 12/13/2021       Reactions   Nsaids    Kidney Disease        Medication List     STOP taking these medications    acetaminophen 500 MG tablet Commonly known as: TYLENOL   docusate sodium 100 MG capsule Commonly known as: Colace   furosemide 40 MG tablet Commonly known as: LASIX   oxyCODONE 5 MG immediate release tablet Commonly known as: Oxy IR/ROXICODONE   rivaroxaban 10 MG Tabs tablet Commonly known as: Xarelto   senna 8.6 MG Tabs tablet Commonly known as: SENOKOT   traZODone 100 MG tablet Commonly known as: DESYREL   Vitamin D (Ergocalciferol) 1.25 MG (50000 UNIT) Caps capsule Commonly known as: DRISDOL       TAKE these medications  amLODipine 10 MG tablet Commonly known as: Norvasc Take 1 tablet (10 mg total) by mouth daily.   Basaglar KwikPen 100 UNIT/ML Inject 50 Units into the skin daily.   blood glucose meter kit and supplies Kit Dispense based on patient and insurance preference. Use up to four times daily as directed.   hydrALAZINE 25 MG tablet Commonly known as:  APRESOLINE Take 75 mg by mouth in the morning, at noon, and at bedtime.   Insulin Pen Needle 32G X 4 MM Misc Glargine 50 units daily and Novolog 10 units TID with meals   Jynarque 90 & 30 MG Tbpk Generic drug: Tolvaptan Take 1 tablet by mouth in the morning and at bedtime.   metoprolol succinate 50 MG 24 hr tablet Commonly known as: TOPROL-XL Take 50 mg by mouth daily.   NovoLOG FlexPen 100 UNIT/ML FlexPen Generic drug: insulin aspart Inject 10 Units into the skin 3 (three) times daily with meals.   sodium bicarbonate 650 MG tablet Take 650 mg by mouth 2 (two) times daily.        Follow-up Information     [name], NP. Schedule an appointment as soon as possible for a visit in 1 week(s).   Specialty: Pulmonary Disease Contact information: [address], [place] [address] [phone]                Allergies  Allergen Reactions   Nsaids     Kidney Disease    Consultations: None   Procedures/Studies: No results found.    Subjective: Patient seen and examined at bedside.  He is okay to go home today.  Denies any overnight fever, nausea or vomiting.  Discharge Exam: Vitals:   12/13/21 0001 12/13/21 0357  BP: 118/79 119/74  Pulse: 71 77  Resp: 16 15  Temp: 98.1 F (36.7 C) 97.7 F (36.5 C)  SpO2: 97% 100%    General: Pt is alert, awake, not in acute distress.  Currently on room air. Cardiovascular: rate controlled, S1/S2 + Respiratory: bilateral decreased breath sounds at bases Abdominal: Soft, morbidly obese, NT, ND, bowel sounds + Extremities: Trace lower extremity edema present; no cyanosis    The results of significant diagnostics from this hospitalization (including imaging, microbiology, ancillary and laboratory) are listed below for reference.     Microbiology: Recent Results (from the past 240 hour(s))  MRSA Next Gen by PCR, Nasal     Status: Abnormal   Collection Time: 12/09/21  8:28 PM   Specimen: Nasal  Mucosa; Nasal Swab  Result Value Ref Range Status   MRSA by PCR Next Gen DETECTED (A) NOT DETECTED Final    Comment: RESULT CALLED TO, READ BACK BY AND VERIFIED WITH: CLAPP,B. 12/10/21 _0  BY SEEL,M. (NOTE) The GeneXpert MRSA Assay (FDA approved for NASAL specimens only), is one component of a comprehensive MRSA colonization surveillance program. It is not intended to diagnose MRSA infection nor to guide or monitor treatment for MRSA infections. Test performance is not FDA approved in patients less than 36 years old. Performed at [facility], [place] [address] [place], [place] [address]      Labs: BNP (last 3 results) No results for input(s): "BNP" in the last 8760 hours. Basic Metabolic Panel: Recent Labs  Lab 12/11/21 0307 12/11/21 1248 12/11/21 1909 12/12/21 0542 12/13/21 0413  NA 135 134* 139 135 137  K 4.3 4.6 4.3 4.0 3.8  CL 105 105 110 107 108  CO2 18* 19* 19* 19*  20*  GLUCOSE 390* 449* 343* 354* 253*  BUN 34* 36* 35* 32* 26*  CREATININE 3.78* 3.81* 3.74* 3.41* 3.15*  CALCIUM 9.1 9.1 9.1 9.0 8.8*  MG 2.6*  --   --  2.4 2.2   Liver Function Tests: Recent Labs  Lab 12/09/21 1658 12/11/21 0307  AST 22 37  ALT 29 44  ALKPHOS 93 67  BILITOT 1.0 0.9  PROT 7.6 5.9*  ALBUMIN 4.3 3.4*   No results for input(s): "LIPASE", "AMYLASE" in the last 168 hours. No results for input(s): "AMMONIA" in the last 168 hours. CBC: Recent Labs  Lab 12/09/21 1658 12/09/21 1704 12/11/21 0801  WBC 8.1  --  6.9  NEUTROABS  --   --  5.0  HGB 13.3 13.9 11.7*  HCT 36.6* 41.0 33.8*  MCV 87.1  --  91.6  PLT 294  --  245   Cardiac Enzymes: No results for input(s): "CKTOTAL", "CKMB", "CKMBINDEX", "TROPONINI" in the last 168 hours. BNP: Invalid input(s): "POCBNP" CBG: Recent Labs  Lab 12/12/21 0755 12/12/21 1222 12/12/21 1636 12/12/21 2024 12/13/21 0736  GLUCAP 380* 335* 337* 317* 322*   D-Dimer No results for input(s): "DDIMER" in the last 72  hours. Hgb A1c No results for input(s): "HGBA1C" in the last 72 hours. Lipid Profile No results for input(s): "CHOL", "HDL", "LDLCALC", "TRIG", "CHOLHDL", "LDLDIRECT" in the last 72 hours. Thyroid function studies No results for input(s): "TSH", "T4TOTAL", "T3FREE", "THYROIDAB" in the last 72 hours.  Invalid input(s): "FREET3" Anemia work up No results for input(s): "VITAMINB12", "FOLATE", "FERRITIN", "TIBC", "IRON", "RETICCTPCT" in the last 72 hours. Urinalysis    Component Value Date/Time   COLORURINE RED (A) 09/17/2019 1351   APPEARANCEUR TURBID (A) 09/17/2019 1351   LABSPEC  09/17/2019 1351    TEST NOT REPORTED DUE TO COLOR INTERFERENCE OF URINE PIGMENT   PHURINE  09/17/2019 1351    TEST NOT REPORTED DUE TO COLOR INTERFERENCE OF URINE PIGMENT   GLUCOSEU (A) 09/17/2019 1351    TEST NOT REPORTED DUE TO COLOR INTERFERENCE OF URINE PIGMENT   HGBUR (A) 09/17/2019 1351    TEST NOT REPORTED DUE TO COLOR INTERFERENCE OF URINE PIGMENT   BILIRUBINUR Negative 09/29/2019 1330   KETONESUR (A) 09/17/2019 1351    TEST NOT REPORTED DUE TO COLOR INTERFERENCE OF URINE PIGMENT   PROTEINUR Positive (A) 09/29/2019 1330   PROTEINUR (A) 09/17/2019 1351    TEST NOT REPORTED DUE TO COLOR INTERFERENCE OF URINE PIGMENT   UROBILINOGEN 0.2 09/29/2019 1330   NITRITE Negative 09/29/2019 1330   NITRITE (A) 09/17/2019 1351    TEST NOT REPORTED DUE TO COLOR INTERFERENCE OF URINE PIGMENT   LEUKOCYTESUR Small (1+) (A) 09/29/2019 1330   LEUKOCYTESUR (A) 09/17/2019 1351    TEST NOT REPORTED DUE TO COLOR INTERFERENCE OF URINE PIGMENT   Sepsis Labs Recent Labs  Lab 12/09/21 1658 12/11/21 0801  WBC 8.1 6.9   Microbiology Recent Results (from the past 240 hour(s))  MRSA Next Gen by PCR, Nasal     Status: Abnormal   Collection Time: 12/09/21  8:28 PM   Specimen: Nasal Mucosa; Nasal Swab  Result Value Ref Range Status   MRSA by PCR Next Gen DETECTED (A) NOT DETECTED Final    Comment: RESULT CALLED TO,  READ BACK BY AND VERIFIED WITH: CLAPP,B. 12/10/21 _0  BY SEEL,M. (NOTE) The GeneXpert MRSA Assay (FDA approved for NASAL specimens only), is one component of a comprehensive MRSA colonization surveillance program. It is not intended to diagnose MRSA  infection nor to guide or monitor treatment for MRSA infections. Test performance is not FDA approved in patients less than 2 years old. Performed at [facility], [place] [address], [place], [place] [address]      Time coordinating discharge: 35 minutes  SIGNED:   Aline August, MD  [facility] 12/13/2021, 11:20 AM

## 2021-12-13 NOTE — Progress Notes (Signed)
Patient and family member given discharge, follow up, and medication instructions, verbalized understanding, IV and telemetry monitor removed, personal belongings with patient, family to transport home

## 2021-12-13 NOTE — Progress Notes (Signed)
  Transition of Care [facility]) Screening Note   Patient Details  Name: Kevin Todd Date of Birth: [birth year]   Transition of Care [facility]) CM/SW Contact:    Roseanne Kaufman, RN Phone Number: 12/13/2021, 11:36 AM    Transition of Care Department [facility]) has reviewed patient and no TOC needs have been identified at this time. We will continue to monitor patient advancement through interdisciplinary progression rounds. If new patient transition needs arise, please place a TOC consult.

## 2021-12-13 NOTE — Plan of Care (Signed)

## 2021-12-21 ENCOUNTER — Telehealth (INDEPENDENT_AMBULATORY_CARE_PROVIDER_SITE_OTHER): Payer: Commercial Managed Care - HMO | Admitting: Nurse Practitioner

## 2021-12-21 ENCOUNTER — Other Ambulatory Visit: Payer: Self-pay | Admitting: Pharmacist

## 2021-12-21 ENCOUNTER — Encounter: Payer: Self-pay | Admitting: Nurse Practitioner

## 2021-12-21 VITALS — BP 138/72 | Ht 68.0 in | Wt 307.0 lb

## 2021-12-21 DIAGNOSIS — I10 Essential (primary) hypertension: Secondary | ICD-10-CM

## 2021-12-21 DIAGNOSIS — N184 Chronic kidney disease, stage 4 (severe): Secondary | ICD-10-CM | POA: Diagnosis not present

## 2021-12-21 DIAGNOSIS — E1122 Type 2 diabetes mellitus with diabetic chronic kidney disease: Secondary | ICD-10-CM | POA: Diagnosis not present

## 2021-12-21 DIAGNOSIS — R252 Cramp and spasm: Secondary | ICD-10-CM

## 2021-12-21 MED ORDER — TIZANIDINE HCL 4 MG PO TABS: 4.0000 mg | ORAL_TABLET | Freq: Every day | ORAL | 0 refills | Status: DC

## 2021-12-21 MED ORDER — FREESTYLE LIBRE 3 SENSOR MISC: 11 refills | Status: DC

## 2021-12-21 NOTE — Chronic Care Management (AMB) (Signed)
Chief Complaint  Patient presents with   Diabetes    Kevin Todd is a 46 y.o. year old male who presented for a telephone visit.   They were referred to the pharmacist by their PCP for assistance in managing diabetes.   Subjective:  Care Team: Primary Care Provider: [name], NP ; Next Scheduled Visit: not scheduled  Medication Access/Adherence  Current Pharmacy:  [id number] [id number]: [phone]: [phone]  Patient reports affordability concerns with their medications: No  Patient reports access/transportation concerns to their pharmacy: No  Patient reports adherence concerns with their medications:  No     Diabetes:  Current medications: Lantus 50 units daily, Novolog 10 units three times daily, Ozempic 0.25 mg weekly x 4 weeks then increase to 0.5 mg weekly. Medications tried in the past:   Recent admission for DKA. Reports this was a new diagnosis of diabetes. He was told by his nephrologist that his glucose was elevated on outside labs and before he could be seen by PCP, he reported to Urgent Care in DKA.   Current glucose readings: pre-meal readings ~200-300s  Asks about use of CGM today  Patient denies hypoglycemic s/sx including dizziness, shakiness, sweating. Patient reports hyperglycemic symptoms including blurred vision and decreased libido. Reports blurred vision was more with far sightedness in the time leading up to hospitalization, but now more with nearsightedness   Health Maintenance  Health Maintenance Due  Topic Date Due   COVID-19 Vaccine (1) Never done   FOOT EXAM  Never done   OPHTHALMOLOGY EXAM  Never done   Hepatitis C Screening  Never done   TETANUS/TDAP  Never done   INFLUENZA VACCINE  12/13/2021     Objective: Lab Results  Component Value Date   HGBA1C 13.1 (H) 12/09/2021    Lab Results   Component Value Date   CREATININE 3.15 (H) 12/13/2021   BUN 26 (H) 12/13/2021   NA 137 12/13/2021   K 3.8 12/13/2021   CL 108 12/13/2021   CO2 20 (L) 12/13/2021    Lab Results  Component Value Date   CHOL 190 09/18/2019   HDL 39 (L) 09/18/2019   LDLCALC 126 (H) 09/18/2019   TRIG 124 09/18/2019   CHOLHDL 4.9 09/18/2019    Medications Reviewed Today     Reviewed by HOsker Mason RPH-CPP (Pharmacist) on 12/21/21 at 1410  Med List Status: <None>   Medication Order Taking? Sig Documenting Provider Last Dose Status Informant  amLODipine (NORVASC) 10 MG tablet [id number] Take 1 tablet (10 mg total) by mouth daily. SRaiford NobleLWest Jordan DNevadaTaking Active Self  blood glucose meter kit and supplies KIT [id number] Dispense based on patient and insurance preference. Use up to four times daily as directed. AAline August MD Taking Active   Continuous Blood Gluc Sensor (FREESTYLE LIBRE 3 SENSOR) [id number] Place 1 sensor on the skin every 14 days. Use to check glucose continuously NFenton Foy NP  Active   HUMALOG KWIKPEN 100 UNIT/ML KwikPen [id number] SMARTSIG:10 Unit(s) SUB-Q 3 Times Daily [provider] Taking Active   hydrALAZINE (APRESOLINE) 25 MG tablet [id number] Take 75 mg by mouth in the morning, at noon, and at bedtime. [provider] Taking Active Self  Insulin Glargine (BASAGLAR KWIKPEN) 100 UNIT/ML [id number] Inject 50 Units into the skin daily. AAline August MD  Taking Active   Insulin Pen Needle 32G X 4 MM MISC [id number] Yes Glargine 50 units daily and Novolog 10 units TID with meals Alekh, Kshitiz, MD Taking Active   JYNARQUE 90 & 30 MG TBPK [id number] Yes Take 1 tablet by mouth in the morning and at bedtime. [provider] Taking Active Self  metoprolol succinate (TOPROL-XL) 50 MG 24 hr tablet [id number] Yes Take 50 mg by mouth daily. [provider] Taking Active Self  Semaglutide (OZEMPIC, 0.25 OR 0.5  MG/DOSE, [place]) [id number] Yes Inject 0.25 mg into the skin once a week. [provider] Taking Active   sodium bicarbonate 650 MG tablet [id number] Yes Take 650 mg by mouth 2 (two) times daily. [provider] Taking Active Self  tiZANidine (ZANAFLEX) 4 MG tablet [id number] Yes Take 1 tablet (4 mg total) by mouth at bedtime. [name], NP Taking Active               Assessment/Plan:   Diabetes: - Currently uncontrolled - Reviewed long term cardiovascular and renal outcomes of uncontrolled blood sugar - Reviewed goal A1c, goal fasting, and goal 2 hour post prandial glucose - Reviewed impact of rapid glucose improvement on eyesight. Discussed impact of hyperglycemia on vascular issues. Encouraged patience with gradual improvement in glycemic control.  - Recommend to follow up with nutrition as scheduled - Given elevations, continue Lantus 50 units daily + Humalog 10 units three times daily with meals, along with Ozempic. He will continue regimen with fasting and 2 hour post prandial checks over the net 2 days; we will review readings on Friday to determine if any insulin reduction needs to be made before the weekend.  - Ordered Libre 3. Patient will investigate if his insurance covers.  - Discussed signs/symptoms of hypoglycemia. Discussed Rule of 15.     Follow Up Plan: phone call in 2 days  Catie TJodi Mourning, PharmD, [facility] [phone]

## 2021-12-21 NOTE — Patient Instructions (Signed)
Remberto,   It was great talking to you today!  Continue Lantus (long acting insulin) 50 units daily, Humalog (mealtime insulin) 10 units before each meal. You started Ozempic today.   Check your blood sugars twice daily:  1) Fasting, first thing in the morning before breakfast and  2) 2 hours after your largest meal.   For a goal A1c of less than 7%, goal fasting readings are less than 130 and goal 2 hour after meal readings are less than 180.   Write down your readings, and we will talk about them Friday.   If you have a low blood sugar less than 70, please eat 15 grams of carbohydrates (4 oz of juice, soda, 4 glucose tablets, or 3-4 pieces of hard candy). Wait 15 minutes and then recheck your blood sugar. If your sugar is still less than 70, eat another 15 grams of carbohydrates. Wait another 15 minutes and recheck your glucose. Continue this until your sugar is over 70. Then, eat a snack with protein in it to prevent your sugar from dropping again.    Take care!  Catie Hedwig Morton, PharmD, [facility] [phone]

## 2021-12-21 NOTE — Patient Instructions (Signed)
1. Nocturnal muscle cramps  - tiZANidine (ZANAFLEX) 4 MG tablet; Take 1 tablet (4 mg total) by mouth at bedtime.  Dispense: 30 tablet; Refill: 0  2. Stage 4 chronic kidney disease ([place])  -continue to follow with nephrology  3. Essential hypertension  Heart healthy renal diet  Continue current medications   4. Type 2 diabetes mellitus with stage 4 chronic kidney disease, without long-term current use of insulin ([place])  Will consult with pharmacist for medication management  Diabetic diet  Follow up:  Follow up in 3 months or sooner if needed

## 2021-12-21 NOTE — Progress Notes (Signed)
Virtual Visit via Video Note  I connected with Kevin Todd on 12/21/21 at 10:20 AM EDT by a video enabled telemedicine application and verified that I am speaking with the correct person using two identifiers.  Location: Patient: home Provider: office   I discussed the limitations of evaluation and management by telemedicine and the availability of in person appointments. The patient expressed understanding and agreed to proceed.  Recent significant events:  Hospital admission: 12/09/21 - 12/13/21  Hospital course: DKA New diagnosis of possible uncontrolled diabetes mellitus type 2 -Presented with blood sugar of 1034 with an anion gap of 18 and bicarb of 17 -Insulin drip has been transitioned to long-acting insulin.  Blood sugars still elevated but improving. -A1c 13.1.  Diabetes coordinator evaluated the patient. -Tolerating carb modified diet. -Currently feels okay to go home today.  Discharge patient home on long-acting insulin 50 units daily and NovoLog 10 units 3 times daily with meals.  Continue carb modified diet at home.  Patient will need very close follow-up with PCP.   Hyponatremia -Possibly from hyperglycemia. Treated with IV fluids.  Improved.  Outpatient follow-up  Hypokalemia -Improved.    Acute kidney injury on chronic kidney disease stage IV Polycystic kidney disease -Follows up with Dr. Moshe Cipro as an outpatient.  Presented with creatinine of 4.6.  Creatinine 3.15 this morning.  Outpatient follow-up with nephrology.  Treated with IV fluids and discontinued. -On tolvaptan and oral sodium and bicarbonate tablets at home which are being continued. -Lasix to remain on hold till reevaluation by Dr. Moshe Cipro.   Hypertension -Monitor blood pressure.  Continue amlodipine, hydralazine, metoprolol  Morbid obesity -Outpatient follow-up  History of Present Illness:   46 y.o. male with history of chronic kidney disease stage IV, polycystic kidney disease,  hypertension  Patient presents today for hospital follow-up through video visit.  Please see notes above.  Patient states that he did follow-up with his nephrologist yesterday.  He did have blood work completed at nephrology office on Monday.  Blood work revealed that blood sugars continue to be elevated at 253.  Nephrology started patient on Ozempic.  He did take his first dose of this medication today.  Patient is on NovoLog 3 times daily and Basaglar once daily.  Will consult with pharmacist for diabetic medication management.  Patient does complain of leg cramps at night.  We will order tizanidine to try to help with this. Denies f/c/s, n/v/d, hemoptysis, PND, leg swelling Denies chest pain or edema      Observations/Objective:     12/21/2021   10:22 AM 12/13/2021   11:42 AM 12/13/2021    5:00 AM  Vitals with BMI  Height '5\' 8"'$     Weight 307 lbs  303 lbs 13 oz  BMI 46.96  29.5  Systolic 284 132   Diastolic 72 91   Pulse  60       Assessment and Plan:  1. Nocturnal muscle cramps  - tiZANidine (ZANAFLEX) 4 MG tablet; Take 1 tablet (4 mg total) by mouth at bedtime.  Dispense: 30 tablet; Refill: 0  2. Stage 4 chronic kidney disease ([place])  -continue to follow with nephrology  3. Essential hypertension  Heart healthy renal diet  Continue current medications   4. Type 2 diabetes mellitus with stage 4 chronic kidney disease, without long-term current use of insulin ([place])  Will consult with pharmacist for medication management  Diabetic diet  Follow up:  Follow up in 3 months or sooner if needed  I discussed the assessment and treatment plan with the patient. The patient was provided an opportunity to ask questions and all were answered. The patient agreed with the plan and demonstrated an understanding of the instructions.   The patient was advised to call back or seek an in-person evaluation if the symptoms worsen or if the condition fails to improve as  anticipated.  I provided 24 minutes of non-face-to-face time during this encounter.   [name], NP

## 2021-12-23 ENCOUNTER — Other Ambulatory Visit: Payer: Self-pay | Admitting: Pharmacist

## 2021-12-23 DIAGNOSIS — E1100 Type 2 diabetes mellitus with hyperosmolarity without nonketotic hyperglycemic-hyperosmolar coma (NKHHC): Secondary | ICD-10-CM

## 2021-12-23 MED ORDER — HUMALOG KWIKPEN 100 UNIT/ML ~~LOC~~ SOPN: 10.0000 [IU] | PEN_INJECTOR | Freq: Three times a day (TID) | SUBCUTANEOUS | 1 refills | Status: DC

## 2021-12-23 NOTE — Chronic Care Management (AMB) (Signed)
Chief Complaint  Patient presents with   Diabetes    Kevin Todd is a 46 y.o. year old male who presented for a telephone visit.   They were referred to the pharmacist by their PCP for assistance in managing diabetes.    Subjective:  Care Team: Primary Care Provider: [name], NP ; Next Scheduled Visit: not scheduled  Medication Access/Adherence  [facility]acy[facility]  [id number] [id number]: [phone]: [phone]  Patient reports affordability concerns with their medications: No  Patient reports access/transportation concerns to their pharmacy: No  Patient reports adherence concerns with their medications:  No     Diabetes:  Current medications: Ozempic 0.25 mg weekly, Basaglar 50 units daily, Humalog 10 units three times daily Medications tried in the past: metformin - renal function not appropriate for initiation   Current glucose readings: fasting: 140-150s; 2 hour post prandial: 160-180s  Reports his insurance did not cover [place]   Patient denies hypoglycemic s/sx including dizziness, shakiness, sweating.    Health Maintenance  Health Maintenance Due  Topic Date Due   COVID-19 Vaccine (1) Never done   FOOT EXAM  Never done   OPHTHALMOLOGY EXAM  Never done   Hepatitis C Screening  Never done   TETANUS/TDAP  Never done   INFLUENZA VACCINE  12/13/2021     Objective: Lab Results  Component Value Date   HGBA1C 13.1 (H) 12/09/2021    Lab Results  Component Value Date   CREATININE 3.15 (H) 12/13/2021   BUN 26 (H) 12/13/2021   NA 137 12/13/2021   K 3.8 12/13/2021   CL 108 12/13/2021   CO2 20 (L) 12/13/2021    Lab Results  Component Value Date   CHOL 190 09/18/2019   HDL 39 (L) 09/18/2019   LDLCALC 126 (H) 09/18/2019   TRIG 124 09/18/2019   CHOLHDL 4.9 09/18/2019    Medications Reviewed Today      Reviewed by HOsker Mason RPH-CPP (Pharmacist) on 12/21/21 at 1410  Med List Status: <None>   Medication Order Taking? Sig Documenting Provider Last Dose Status Informant  amLODipine (NORVASC) 10 MG tablet [id number] Take 1 tablet (10 mg total) by mouth daily. SRaiford NobleLNectar DN[place]Taking Active Self  blood glucose meter kit and supplies KIT [id number] Dispense based on patient and insurance preference. Use up to four times daily as directed. AAline August MD Taking Active   Continuous Blood Gluc Sensor (FREESTYLE LIBRE 3 SENSOR) [id number] Place 1 sensor on the skin every 14 days. Use to check glucose continuously NFenton Foy NP  Active   HUMALOG KWIKPEN 100 UNIT/ML KwikPen [id number] SMARTSIG:10 Unit(s) SUB-Q 3 Times Daily [provider] Taking Active   hydrALAZINE (APRESOLINE) 25 MG tablet [id number] Take 75 mg by mouth in the morning, at noon, and at bedtime. [provider] Taking Active Self  Insulin Glargine (BASAGLAR KWIKPEN) 100 UNIT/ML [id number] Inject 50 Units into the skin daily. AAline August MD Taking Active   Insulin Pen Needle 32G X 4 MM MISC [id number] Glargine 50 units daily and Novolog 10 units TID with meals Alekh, Kshitiz, MD Taking Active   JYNARQUE 90 & 30 MG TBPK [id number] Take 1 tablet by mouth in the morning and at bedtime. [provider] Taking Active Self  metoprolol succinate (TOPROL-XL) 50 MG 24 hr tablet  [id number] Yes Take 50 mg by mouth daily. [provider] Taking Active Self  Semaglutide (OZEMPIC, 0.25 OR 0.5 MG/DOSE, [place]) [id number] Yes Inject 0.25 mg into the skin once a week. [provider] Taking Active   sodium bicarbonate 650 MG tablet [id number] Yes Take 650 mg by mouth 2 (two) times daily. [provider] Taking Active Self  tiZANidine (ZANAFLEX) 4 MG tablet [id number] Yes Take 1 tablet (4 mg total) by mouth at bedtime. [name], NP Taking Active                Assessment/Plan:   Diabetes: - Currently uncontrolled but improving - Recommend to continue current regimen at this time.  - PA submitted on Cover My Meds for Libre 3. Will follow.    Follow Up Plan: phone call in 4 weeks  Catie TJodi Mourning, PharmD, [facility] [phone]

## 2021-12-23 NOTE — Patient Instructions (Addendum)
Jori Moll,     Continue Lantus (long acting insulin) 50 units daily, Humalog (mealtime insulin) 10 units before each meal, and Ozempic 0.25 mg weekly for a total of 4 weeks, then increase to 0.5 mg.   Check your blood sugars twice daily:  1) Fasting, first thing in the morning before breakfast and  2) 2 hours after your largest meal.    For a goal A1c of less than 7%, goal fasting readings are less than 130 and goal 2 hour after meal readings are less than 180.    Write down your readings, and we will talk about them Friday.    If you have a low blood sugar less than 70, please eat 15 grams of carbohydrates (4 oz of juice, soda, 4 glucose tablets, or 3-4 pieces of hard candy). Wait 15 minutes and then recheck your blood sugar. If your sugar is still less than 70, eat another 15 grams of carbohydrates. Wait another 15 minutes and recheck your glucose. Continue this until your sugar is over 70. Then, eat a snack with protein in it to prevent your sugar from dropping again.     Take care!   Catie Hedwig Morton, PharmD, [facility]

## 2021-12-28 ENCOUNTER — Encounter: Payer: Commercial Managed Care - HMO | Attending: Nurse Practitioner | Admitting: Skilled Nursing Facility1

## 2021-12-28 ENCOUNTER — Encounter: Payer: Self-pay | Admitting: Skilled Nursing Facility1

## 2021-12-28 DIAGNOSIS — N184 Chronic kidney disease, stage 4 (severe): Secondary | ICD-10-CM | POA: Insufficient documentation

## 2021-12-28 DIAGNOSIS — E1122 Type 2 diabetes mellitus with diabetic chronic kidney disease: Secondary | ICD-10-CM | POA: Insufficient documentation

## 2021-12-28 DIAGNOSIS — E1310 Other specified diabetes mellitus with ketoacidosis without coma: Secondary | ICD-10-CM | POA: Diagnosis present

## 2021-12-28 NOTE — Progress Notes (Signed)
Diabetes Self-Management Education  Visit Type: First/Initial   12/28/2021  Mr. Kevin Todd, identified by name and date of birth, is a 46 y.o. male with a diagnosis of Diabetes: Type 2.   ASSESSMENT  Height '5\' 8"'$  (1.727 m), weight (!) 306 lb (138.8 kg). Body mass index is 46.53 kg/m.  Pt arrives having made great changes already and believes he will continue with his changes.  DM Rx: Basaglar 50 units Novolog 10 units 3 times a day with meals but does not take with lunch due to blood sugars being 100 Ozempic   Pt states the ozempic helped a lot. Pt states he doe shave the CGM but the sensor keeps falling of.  Pt states he feels he is motivated. Pt states he does not have much of an appetite. Pt states he cares about his kidneys a lot.  Pt states he will check his blood sugars 4 times a day: 73-127 (before meals). Pt states he drinks over 100 ounces per day.   Pt states he is trying to be more active but has had an achillis issue reducing his mobility.   Pts wife states they already do not eat eta out and do not cook with salt.    Other Dx: HTN CKD stage 4  Goals: -ride your bike for 30 minutes 6 days a week -do your weights 2-3 times a week -avoid package foods -have a couple meals a week be plant based protein sources -choose complex carbohydrates  -eat at least 1 full cup of non starchy vegetables 2 times d ay -choose the whole fruit rather than juice or dried fruits    Diabetes Self-Management Education - 12/28/21 1353       Visit Information   Visit Type First/Initial      Initial Visit   Diabetes Type Type 2    Date Diagnosed July 2023    Are you currently following a meal plan? No    Are you taking your medications as prescribed? Yes      Health Coping   How would you rate your overall health? Poor      Psychosocial Assessment   Patient Belief/Attitude about Diabetes Afraid    What is the hardest part about your diabetes right now, causing you  the most concern, or is the most worrisome to you about your diabetes?   Making healty food and beverage choices    Self-care barriers None    Self-management support Family    Other persons present Spouse/SO    Patient Concerns Nutrition/Meal planning    Special Needs None    Preferred Learning Style Visual    Learning Readiness Contemplating    How often do you need to have someone help you when you read instructions, pamphlets, or other written materials from your doctor or pharmacy? 1 - Never      Pre-Education Assessment   Patient understands the diabetes disease and treatment process. Needs Instruction    Patient understands incorporating nutritional management into lifestyle. Needs Instruction    Patient undertands incorporating physical activity into lifestyle. Needs Instruction    Patient understands using medications safely. Needs Instruction    Patient understands monitoring blood glucose, interpreting and using results Needs Instruction    Patient understands prevention, detection, and treatment of acute complications. Needs Instruction    Patient understands prevention, detection, and treatment of chronic complications. Needs Instruction    Patient understands how to develop strategies to address psychosocial issues. Needs Instruction  Patient understands how to develop strategies to promote health/change behavior. Needs Instruction      Complications   Last HgB A1C per patient/outside source 13.1 %    How often do you check your blood sugar? 3-4 times/day    Fasting Blood glucose range (mg/dL) 70-129    Postprandial Blood glucose range (mg/dL) 70-129    Number of hypoglycemic episodes per month 0    Number of hyperglycemic episodes ( >'200mg'$ /dL): Never    Can you tell when your blood sugar is high? No    Have you had a dilated eye exam in the past 12 months? Yes    Have you had a dental exam in the past 12 months? No    Are you checking your feet? Yes    How many days  per week are you checking your feet? 7      Dietary Intake   Breakfast 1-2 eggs + 1-2 bacon + bowl of fruit    Snack (morning) fruit    Lunch salad: carrots, radish, lettuce, or spring mix + eggs or chicken + cucmber + dressing or tuna sandwich and bowl of fruit    Dinner 1 steak taco + 3 chicken wings    Snack (evening) sugar free popcicle    Beverage(s) water, sugar free gatorade      Activity / Exercise   Activity / Exercise Type ADL's    How many days per week do you exercise? 0    How many minutes per day do you exercise? 0    Total minutes per week of exercise 0      Patient Education   Previous Diabetes Education No    Disease Pathophysiology Definition of diabetes, type 1 and 2, and the diagnosis of diabetes;Factors that contribute to the development of diabetes    Healthy Eating Plate Method;Carbohydrate counting;Reviewed blood glucose goals for pre and post meals and how to evaluate the patients' food intake on their blood glucose level.;Information on hints to eating out and maintain blood glucose control.;Meal options for control of blood glucose level and chronic complications.;Role of diet in the treatment of diabetes and the relationship between the three main macronutrients and blood glucose level;Food label reading, portion sizes and measuring food.    Being Active Role of exercise on diabetes management, blood pressure control and cardiac health.;Identified with patient nutritional and/or medication changes necessary with exercise.;Helped patient identify appropriate exercises in relation to his/her diabetes, diabetes complications and other health issue.    Medications Taught/reviewed insulin/injectables, injection, site rotation, insulin/injectables storage and needle disposal.;Reviewed patients medication for diabetes, action, purpose, timing of dose and side effects.    Monitoring Taught/evaluated SMBG meter.;Taught/evaluated CGM (comment);Interpreting lab values - A1C,  lipid, urine microalbumina.;Daily foot exams;Yearly dilated eye exam    Acute complications Taught prevention, symptoms, and  treatment of hypoglycemia - the 15 rule.;Discussed and identified patients' prevention, symptoms, and treatment of hyperglycemia.    Chronic complications Assessed and discussed foot care and prevention of foot problems;Nephropathy, what it is, prevention of, the use of ACE, ARB's and early detection of through urine microalbumia.;Retinopathy and reason for yearly dilated eye exams;Dental care    Diabetes Stress and Support Worked with patient to identify barriers to care and solutions;Identified and addressed patients feelings and concerns about diabetes      Individualized Goals (developed by patient)   Nutrition Follow meal plan discussed;General guidelines for healthy choices and portions discussed    Physical Activity Exercise 5-7 days per week;30  minutes per day    Medications take my medication as prescribed    Monitoring  Test blood glucose pre and post meals as discussed    Problem Solving Eating Pattern    Reducing Risk treat hypoglycemia with 15 grams of carbs if blood glucose less than '70mg'$ /dL      Post-Education Assessment   Patient understands the diabetes disease and treatment process. Demonstrates understanding / competency    Patient understands incorporating nutritional management into lifestyle. Demonstrates understanding / competency    Patient undertands incorporating physical activity into lifestyle. Demonstrates understanding / competency    Patient understands using medications safely. Demonstrates understanding / competency    Patient understands monitoring blood glucose, interpreting and using results Demonstrates understanding / competency    Patient understands prevention, detection, and treatment of acute complications. Demonstrates understanding / competency    Patient understands prevention, detection, and treatment of chronic complications.  Demonstrates understanding / competency    Patient understands how to develop strategies to address psychosocial issues. Demonstrates understanding / competency    Patient understands how to develop strategies to promote health/change behavior. Demonstrates understanding / competency      Outcomes   Expected Outcomes Demonstrated interest in learning. Expect positive outcomes    Future DMSE PRN    Program Status Completed             Individualized Plan for Diabetes Self-Management Training:   Learning Objective:  Patient will have a greater understanding of diabetes self-management. Patient education plan is to attend individual and/or group sessions per assessed needs and concerns.   Expected Outcomes:  Demonstrated interest in learning. Expect positive outcomes  Education material provided: ADA - How to Thrive: A Guide for Your Journey with Diabetes, Food label handouts, Meal plan card, My Plate, and Snack sheet  If problems or questions, patient to contact team via:  Phone and Email  Future DSME appointment: PRN

## 2022-01-03 ENCOUNTER — Telehealth: Payer: Self-pay | Admitting: Pharmacist

## 2022-01-03 ENCOUNTER — Other Ambulatory Visit: Payer: Self-pay | Admitting: Pharmacist

## 2022-01-03 DIAGNOSIS — N184 Chronic kidney disease, stage 4 (severe): Secondary | ICD-10-CM

## 2022-01-03 DIAGNOSIS — I10 Essential (primary) hypertension: Secondary | ICD-10-CM

## 2022-01-03 DIAGNOSIS — E1122 Type 2 diabetes mellitus with diabetic chronic kidney disease: Secondary | ICD-10-CM

## 2022-01-03 NOTE — Telephone Encounter (Signed)
Patient to call the office to schedule 3 month follow up for DM/HTN/HLD. Schedule after October 28th (last A1c was July 28th).   Catie Hedwig Morton, PharmD, [facility] [phone]

## 2022-01-03 NOTE — Chronic Care Management (AMB) (Signed)
Chief Complaint  Patient presents with   Diabetes   Hypertension    Kevin Todd is a 46 y.o. year old male who presented for a telephone visit.   They were referred to the pharmacist by their PCP for assistance in managing diabetes.    Subjective:  Care Team: Primary Care Provider: [name], NP ; Next Scheduled Visit: not scheduled  Medication Access/Adherence  [facility]  [id number] [place] [id number]: [phone]: [phone]  Patient reports affordability concerns with their medications: No  Patient reports access/transportation concerns to their pharmacy: No  Patient reports adherence concerns with their medications:  No     Diabetes:  Current medications: Ozempic 0.25 mg weekly, Basaglar 50 units daily, Humalog 10 units with meals - though reports he has not been needing with lunch   Date of Download: 01/03/22 % Time CGM is active: 48% Average Glucose: 120 mg/dL Glucose Management Indicator: 6.2  Glucose Variability: 26.1 (goal <36%) Time in Goal:  - Time in range 70-180: 94% - Time above range: 4% - Time below range: 2% Observed patterns: highest spike after breakfast.     Reports he did have some issues with the sensor not staying on. Has since added adhesive.    Patient denies hypoglycemic s/sx including dizziness, shakiness, sweating.   Current meal patterns:  - Breakfast: eggs, bacon, fruit; coffee w/ sugar free cream - Lunch: salad;  - Supper: protein, vegetables.  - Drinks: sugar free gatorade, ocean spray  Hypertension:  Current medications: amlodipine 10 mg daily, metoprolol succinate 50 mg daily, hydralazine 75 mg three times daily  Hyperlipidemia/ASCVD Risk Reduction  Current lipid lowering medications: none Medications tried in the past:   Clinical ASCVD: No  The 10-year ASCVD risk score  (Arnett DK, et al., 2019) is: 26.1%   Values used to calculate the score:     Age: 89years     Sex: Male     Is Non-Hispanic African American: Yes     Diabetic: Yes     Tobacco smoker: Yes     Systolic Blood Pressure: 1160mmHg     Is BP treated: Yes     HDL Cholesterol: 39 mg/dL     Total Cholesterol: 190 mg/dL     Health Maintenance  Health Maintenance Due  Topic Date Due   COVID-19 Vaccine (1) Never done   FOOT EXAM  Never done   OPHTHALMOLOGY EXAM  Never done   Hepatitis C Screening  Never done   TETANUS/TDAP  Never done   INFLUENZA VACCINE  12/13/2021     Objective: Lab Results  Component Value Date   HGBA1C 13.1 (H) 12/09/2021    Lab Results  Component Value Date   CREATININE 3.15 (H) 12/13/2021   BUN 26 (H) 12/13/2021   NA 137 12/13/2021   K 3.8 12/13/2021   CL 108 12/13/2021   CO2 20 (L) 12/13/2021    Lab Results  Component Value Date   CHOL 190 09/18/2019   HDL 39 (L) 09/18/2019   LDLCALC 126 (H) 09/18/2019   TRIG 124 09/18/2019   CHOLHDL 4.9 09/18/2019    Medications Reviewed Today     Reviewed by HOsker Mason RPH-CPP (Pharmacist) on 01/03/22 at [phone] Med List Status: <None>   Medication Order Taking? Sig Documenting Provider Last Dose Status Informant  amLODipine (NORVASC) 10 MG tablet  [id number] Yes Take 1 tablet (10 mg total) by mouth daily. Raiford Noble [place] Taking Active Self  blood glucose meter kit and supplies KIT [id number]  Dispense based on patient and insurance preference. Use up to four times daily as directed. Aline August, MD  Active   Continuous Blood Gluc Sensor (FREESTYLE LIBRE 3 SENSOR) [place] [id number] Yes Place 1 sensor on the skin every 14 days. Use to check glucose continuously [name], NP Taking Active   HUMALOG KWIKPEN 100 UNIT/ML KwikPen [id number] Yes Inject 10 Units into the skin 3 (three) times daily. [name], NP Taking Active   hydrALAZINE (APRESOLINE) 25 MG tablet [id number] Yes Take 75 mg  by mouth in the morning, at noon, and at bedtime. [provider] Taking Active Self  Insulin Glargine (BASAGLAR KWIKPEN) 100 UNIT/ML [id number] Yes Inject 50 Units into the skin daily. Aline August, MD Taking Active   Insulin Pen Needle 32G X 4 MM MISC [id number]  Glargine 50 units daily and Novolog 10 units TID with meals Alekh, Kshitiz, MD  Active   JYNARQUE 90 & 30 MG TBPK [id number]  Take 1 tablet by mouth in the morning and at bedtime. [provider]  Active Self  metoprolol succinate (TOPROL-XL) 50 MG 24 hr tablet [id number] Yes Take 50 mg by mouth daily. [provider] Taking Active Self  Semaglutide (OZEMPIC, 0.25 OR 0.5 MG/DOSE, [place]) [id number] Yes Inject 0.25 mg into the skin once a week. [provider] Taking Active   sodium bicarbonate 650 MG tablet [id number]  Take 650 mg by mouth 2 (two) times daily. [provider]  Active Self  tiZANidine (ZANAFLEX) 4 MG tablet [id number] Yes Take 1 tablet (4 mg total) by mouth at bedtime. [name], NP Taking Active               Assessment/Plan:   Diabetes: - Currently uncontrolled but improving per CGM data  - Reviewed goal A1c, goal fasting, and goal 2 hour post prandial glucose - Reviewed dietary modifications including lean proteins, fruits and vegetables, whole grains - Reviewed lifestyle modifications including: - Recommend to continue current regimen. Recommend to increase Ozempic to 0.5 mg weekly on the 5th dose as prescribed by nephrology. At that time, would advise to reduce Lantus to 40 units daily, reduce Humalog to 6 units with his largest meal. Will discuss with PCP.  - Recommend to check glucose continuously using CGM. Discussed use of skin tac under sensor, or Skin Grips/Tegaderm over sensor.  - Advised patient to schedule PCP visit after October 28th for updated labs.    Hypertension: - Currently controlled per last office visit - Recommend to continue current regimen  along with collaboration with nephrology   Hyperlipidemia/ASCVD Risk Reduction: - Currently uncontrolled.  - Recommend to repeat lipids at next visit. Recommend initiation of statin   Follow Up Plan: phone call in 4 weeks  Catie Hedwig Morton, PharmD, [facility] [phone]

## 2022-01-03 NOTE — Patient Instructions (Signed)
Prabhav,   It was great talking to you today!  Keep up the great work. Like nephrology prescribed, continue Ozempic at 0.25 mg weekly for a total of 4 weeks, then on the 5th week, increase to 0.5 mg weekly. I recommend we reduce Lantus to 40 units daily and reduce Humalog to 6 units with your biggest meal at that time. I'll discuss this with Lazaro Arms.   Keep using the [place] 3. You can use "Skin Tac" on your skin before placing the sensor, or use "Skin Grips" or "Tegaderm" adhesive bandages over top of the sensor.   Please reach out if you develop any concerns with low blood sugars less than 80 or any symptoms of low blood sugars - dizziness, sweatiness, lightheadedness.   Take care!  Catie Hedwig Morton, PharmD, [facility] [phone]

## 2022-01-11 ENCOUNTER — Ambulatory Visit (INDEPENDENT_AMBULATORY_CARE_PROVIDER_SITE_OTHER): Payer: Commercial Managed Care - HMO | Admitting: Nurse Practitioner

## 2022-01-11 ENCOUNTER — Encounter: Payer: Self-pay | Admitting: Nurse Practitioner

## 2022-01-11 VITALS — BP 162/93 | HR 78 | Temp 98.4°F | Wt 304.5 lb

## 2022-01-11 DIAGNOSIS — E1122 Type 2 diabetes mellitus with diabetic chronic kidney disease: Secondary | ICD-10-CM | POA: Diagnosis not present

## 2022-01-11 DIAGNOSIS — G8929 Other chronic pain: Secondary | ICD-10-CM

## 2022-01-11 DIAGNOSIS — M25559 Pain in unspecified hip: Secondary | ICD-10-CM | POA: Diagnosis not present

## 2022-01-11 DIAGNOSIS — N184 Chronic kidney disease, stage 4 (severe): Secondary | ICD-10-CM

## 2022-01-11 LAB — POCT URINALYSIS DIP (CLINITEK)
Bilirubin, UA: NEGATIVE
Blood, UA: NEGATIVE
Glucose, UA: NEGATIVE mg/dL
Ketones, POC UA: NEGATIVE mg/dL
Leukocytes, UA: NEGATIVE
Nitrite, UA: NEGATIVE
POC PROTEIN,UA: >=300 — AB
Spec Grav, UA: 1.01 (ref 1.010–1.025)
Urobilinogen, UA: 0.2 E.U./dL
pH, UA: 6.5 (ref 5.0–8.0)

## 2022-01-11 LAB — POCT GLYCOSYLATED HEMOGLOBIN (HGB A1C)
HbA1c POC (<> result, manual entry): 10.7 % (ref 4.0–5.6)
HbA1c, POC (controlled diabetic range): 10.7 % — AB (ref 0.0–7.0)
HbA1c, POC (prediabetic range): 10.7 % — AB (ref 5.7–6.4)
Hemoglobin A1C: 10.7 % — AB (ref 4.0–5.6)

## 2022-01-11 MED ORDER — BASAGLAR KWIKPEN 100 UNIT/ML ~~LOC~~ SOPN: 40.0000 [IU] | PEN_INJECTOR | Freq: Every day | SUBCUTANEOUS | 0 refills | Status: DC

## 2022-01-11 MED ORDER — OZEMPIC (0.25 OR 0.5 MG/DOSE) 2 MG/3ML ~~LOC~~ SOPN: 0.2500 mg | PEN_INJECTOR | SUBCUTANEOUS | 0 refills | Status: DC

## 2022-01-11 MED ORDER — GABAPENTIN 100 MG PO CAPS: 100.0000 mg | ORAL_CAPSULE | Freq: Three times a day (TID) | ORAL | 3 refills | Status: DC

## 2022-01-11 NOTE — Assessment & Plan Note (Signed)
-   POCT glycosylated hemoglobin (Hb A1C) - Insulin Glargine (BASAGLAR KWIKPEN) 100 UNIT/ML; Inject 40 Units into the skin daily.  Dispense: 15 mL; Refill: 0 - Semaglutide,0.25 or 0.'5MG'$ /DOS, (OZEMPIC, 0.25 OR 0.5 MG/DOSE,) 2 MG/3ML SOPN; Inject 0.25 mg into the skin once a week.  Dispense: 3 mL; Refill: 0  Follow up:  Follow up in 2 months or sooner if needed

## 2022-01-11 NOTE — Progress Notes (Signed)
@Patient  ID: Kevin Todd, male    DOB: Jun 07, 1975, 46 y.o.   MRN: [id number]  Chief Complaint  Patient presents with   Follow-up    Pt is here for elevated A1C. Pt stated he has itching all over his body for the past 2 month's pt would like his insulin dosage changed. Pt is requesting a refill on all medications also would like pain meds for hip pain    Referring provider: [name], NP  HPI  46 year old male with history of hypertension, type 2 diabetes, CKD, tobacco abuse, and obesity.  Patient presents today for a follow-up visit.  He states that he needs to have his A1c rechecked that was checked 1 month ago.  He states that orthopedics is requesting this because I would like to do cortisone injections for patient's chronic hip pain.  Patient was started on Ozempic 1 month ago after his last visit here.  He states that this has significantly helped his blood sugars.  He is not ranging around 108 for the past 7 days.  He has stopped his NovoLog.  He is still taking Lantus but thinks that this may need to be decreased.  We discussed that he can decrease this to 40 units daily.  We will have patient return in 2 months to have A1c rechecked again. Denies f/c/s, n/v/d, hemoptysis, PND, leg swelling Denies chest pain or edema     Allergies  Allergen Reactions   Nsaids     Kidney Disease     There is no immunization history on file for this patient.  Past Medical History:  Diagnosis Date   Chronic kidney disease    Elevated creatine kinase 09/2019   Hypertension    Iliac crest spur of left hip    Insomnia 10/2019   Pneumothorax on right    Polycystic kidney disease    Vitamin D deficiency 09/2019    Tobacco History: Social History   Tobacco Use  Smoking Status Former   Packs/day: 0.25   Types: Cigarettes  Smokeless Tobacco Never  Tobacco Comments   4 cigarettes/pd recently quit 03/2021   Counseling given: Not Answered Tobacco comments: 4 cigarettes/pd  recently quit 03/2021   Outpatient Encounter Medications as of 01/11/2022  Medication Sig   amLODipine (NORVASC) 10 MG tablet Take 1 tablet (10 mg total) by mouth daily.   blood glucose meter kit and supplies KIT Dispense based on patient and insurance preference. Use up to four times daily as directed.   Continuous Blood Gluc Sensor (FREESTYLE LIBRE 3 SENSOR) MISC Place 1 sensor on the skin every 14 days. Use to check glucose continuously   gabapentin (NEURONTIN) 100 MG capsule Take 1 capsule (100 mg total) by mouth 3 (three) times daily.   HUMALOG KWIKPEN 100 UNIT/ML KwikPen Inject 10 Units into the skin 3 (three) times daily.   hydrALAZINE (APRESOLINE) 25 MG tablet Take 75 mg by mouth in the morning, at noon, and at bedtime.   Insulin Pen Needle 32G X 4 MM MISC Glargine 50 units daily and Novolog 10 units TID with meals   JYNARQUE 90 & 30 MG TBPK Take 1 tablet by mouth in the morning and at bedtime.   metoprolol succinate (TOPROL-XL) 50 MG 24 hr tablet Take 50 mg by mouth daily.   Semaglutide,0.25 or 0.5MG/DOS, (OZEMPIC, 0.25 OR 0.5 MG/DOSE,) 2 MG/3ML SOPN Inject 0.25 mg into the skin once a week.   sodium bicarbonate 650 MG tablet Take 650 mg by  mouth 2 (two) times daily.   tiZANidine (ZANAFLEX) 4 MG tablet Take 1 tablet (4 mg total) by mouth at bedtime.   [DISCONTINUED] Insulin Glargine (BASAGLAR KWIKPEN) 100 UNIT/ML Inject 50 Units into the skin daily.   [DISCONTINUED] Semaglutide (OZEMPIC, 0.25 OR 0.5 MG/DOSE, [place]) Inject 0.25 mg into the skin once a week.   Insulin Glargine (BASAGLAR KWIKPEN) 100 UNIT/ML Inject 40 Units into the skin daily.   No facility-administered encounter medications on file as of 01/11/2022.     Review of Systems  Review of Systems  Constitutional: Negative.   HENT: Negative.    Cardiovascular: Negative.   Gastrointestinal: Negative.   Musculoskeletal:        Bilateral hip pain  Allergic/Immunologic: Negative.   Neurological: Negative.    Psychiatric/Behavioral: Negative.         Physical Exam  BP (!) 162/93 (BP Location: Left Arm, Patient Position: Sitting, Cuff Size: Large)   Pulse 78   Temp 98.4 F (36.9 C)   Wt (!) 304 lb 8 oz (138.1 kg)   BMI 46.30 kg/m   Wt Readings from Last 5 Encounters:  01/11/22 (!) 304 lb 8 oz (138.1 kg)  12/28/21 (!) 306 lb (138.8 kg)  12/21/21 (!) 307 lb (139.3 kg)  12/13/21 (!) 303 lb 12.7 oz (137.8 kg)  04/14/21 (!) 321 lb 14 oz (146 kg)     Physical Exam Vitals and nursing note reviewed.  Constitutional:      General: He is not in acute distress.    Appearance: He is well-developed.  Cardiovascular:     Rate and Rhythm: Normal rate and regular rhythm.  Pulmonary:     Effort: Pulmonary effort is normal.     Breath sounds: Normal breath sounds.  Musculoskeletal:     Comments: Bilateral hip pain  Skin:    General: Skin is warm and dry.  Neurological:     Mental Status: He is alert and oriented to person, place, and time.      Lab Results:  CBC    Component Value Date/Time   WBC 6.9 12/11/2021 0801   RBC 3.69 (L) 12/11/2021 0801   HGB 11.7 (L) 12/11/2021 0801   HGB 11.5 (L) 09/29/2019 1402   HCT 33.8 (L) 12/11/2021 0801   HCT 33.3 (L) 09/29/2019 1402   PLT 245 12/11/2021 0801   PLT 349 09/29/2019 1402   MCV 91.6 12/11/2021 0801   MCV 88 09/29/2019 1402   MCH 31.7 12/11/2021 0801   MCHC 34.6 12/11/2021 0801   RDW 13.4 12/11/2021 0801   RDW 13.3 09/29/2019 1402   LYMPHSABS 1.3 12/11/2021 0801   LYMPHSABS 1.6 09/29/2019 1402   MONOABS 0.4 12/11/2021 0801   EOSABS 0.1 12/11/2021 0801   EOSABS 0.2 09/29/2019 1402   BASOSABS 0.1 12/11/2021 0801   BASOSABS 0.1 09/29/2019 1402    BMET    Component Value Date/Time   NA 137 12/13/2021 0413   NA 142 09/29/2019 1402   K 3.8 12/13/2021 0413   CL 108 12/13/2021 0413   CO2 20 (L) 12/13/2021 0413   GLUCOSE 253 (H) 12/13/2021 0413   BUN 26 (H) 12/13/2021 0413   BUN 33 (H) 09/29/2019 1402   CREATININE 3.15  (H) 12/13/2021 0413   CALCIUM 8.8 (L) 12/13/2021 0413   GFRNONAA 24 (L) 12/13/2021 0413   GFRAA 29 (L) 09/29/2019 1402     Assessment & Plan:   Type 2 diabetes 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  Dispense: 15 mL; Refill: 0 - Semaglutide,0.25 or 0.5MG/DOS, (OZEMPIC, 0.25 OR 0.5 MG/DOSE,) 2 MG/3ML SOPN; Inject 0.25 mg into the skin once a week.  Dispense: 3 mL; Refill: 0  Follow up:  Follow up in 2 months or sooner if needed     [name], NP 01/11/2022

## 2022-01-11 NOTE — Patient Instructions (Addendum)
1. 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  Dispense: 15 mL; Refill: 0 - Semaglutide,0.25 or 0.'5MG'$ /DOS, (OZEMPIC, 0.25 OR 0.5 MG/DOSE,) 2 MG/3ML SOPN; Inject 0.25 mg into the skin once a week.  Dispense: 3 mL; Refill: 0  Follow up:  Follow up in 2 months or sooner if needed

## 2022-01-11 NOTE — Addendum Note (Signed)
Addended by: Schuyler Amor on: 01/11/2022 12:50 PM   Modules accepted: Orders

## 2022-01-17 ENCOUNTER — Other Ambulatory Visit: Payer: Self-pay | Admitting: Nurse Practitioner

## 2022-01-17 DIAGNOSIS — R252 Cramp and spasm: Secondary | ICD-10-CM

## 2022-01-26 ENCOUNTER — Other Ambulatory Visit: Payer: Self-pay | Admitting: Nurse Practitioner

## 2022-01-26 DIAGNOSIS — R252 Cramp and spasm: Secondary | ICD-10-CM

## 2022-01-26 MED ORDER — TIZANIDINE HCL 4 MG PO TABS: 4.0000 mg | ORAL_TABLET | Freq: Every day | ORAL | 0 refills | Status: AC

## 2022-01-30 ENCOUNTER — Ambulatory Visit: Payer: Commercial Managed Care - HMO

## 2022-01-30 ENCOUNTER — Other Ambulatory Visit: Payer: Commercial Managed Care - HMO | Admitting: Pharmacist

## 2022-01-30 MED ORDER — GLUCOSE BLOOD VI STRP: ORAL_STRIP | 12 refills | Status: DC

## 2022-01-30 NOTE — Patient Instructions (Signed)
Eryx,   It was great talking to you today!  I agree with Dr. Moshe Cipro with trying Trulicity. Continue to monitor your blood sugars using the [facility]  Check your blood pressure three times weekly, and any time you have concerning symptoms like headache, chest pain, dizziness, shortness of breath, or vision changes.   Our goal is less than 130/80.  To appropriately check your blood pressure, make sure you do the following:  1) Avoid caffeine, exercise, or tobacco products for 30 minutes before checking. Empty your bladder. 2) Sit with your back supported in a flat-backed chair. Rest your arm on something flat (arm of the chair, table, etc). 3) Sit still with your feet flat on the floor, resting, for at least 5 minutes.  4) Check your blood pressure. Take 1-2 readings.  5) Write down these readings and bring with you to any provider appointments.  Bring your home blood pressure machine with you to a provider's office for accuracy comparison at least once a year.   Make sure you take your blood pressure medications before you come to any office visit, even if you were asked to fast for labs.  Take care!  Catie Hedwig Morton, PharmD, [facility] [phone]

## 2022-01-30 NOTE — Addendum Note (Signed)
Addended by: Kaylyn Layer T on: 01/30/2022 12:22 PM   Modules accepted: Orders

## 2022-01-30 NOTE — Progress Notes (Addendum)
Chief Complaint  Patient presents with   Diabetes   Hypertension    Kevin Todd is a 46 y.o. year old male who presented for a telephone visit.   They were referred to the pharmacist by their PCP for assistance in managing diabetes.    Subjective:  Care Team: Primary Care Provider: [name], NP ; Next Scheduled Visit: 03/13/22  Medication Access/Adherence  Current Pharmacy[facility]  [id number] [place] [id number]: [phone]: [phone]  Patient reports affordability concerns with their medications: No  Patient reports access/transportation concerns to their pharmacy: No  Patient reports adherence concerns with their medications:  No     Diabetes:  Current medications: patient reports that due to hypoglycemia, he has stopped BErnest Reports that his insurance no longer covers Ozempic so Dr. GMoshe Ciprochanged to Trulicity 1.5 mg weekly. He has picked up this dose. He skipped Ozempic dose last week because he was worried about hypoglycemia  Reports concerns about differences between fingerstick BG readings and Libre  Date of Download: 01/17/22-01/30/22 % Time CGM is active: 95% Average Glucose: 99 mg/dL Glucose Management Indicator: 5.7  Glucose Variability: 25.9 (goal <36%) Time in Goal:  - Time in range 70-180: 92% - Time above range: 1% - Time below range: 7%  Patient denies hypoglycemic s/sx including dizziness, shakiness, sweating.   Current meal patterns:  - Breakfast: eggs, bacon, toast - Lunch: lighter meal, tuna, leftovers, greens - Supper: meat and veggies, sometimes starch - Snacks: sugar free popsicles at night; regular ginger ale if sugar was dropping - Drinks: sugar free gatorade and water   Current physical activity: physical therapy twice weekly (hip); stationary bike, treadmill, rowers; ~60-75  minutes  Hypertension:  Current medications: amlodipine 10 mg daily, hydralazine 75 mg three times daily, metoprolol succinate 50 mg daily  Patient has a validated, automated, upper arm home BP cuff Current blood pressure readings readings: has not been checking recently    Health Maintenance  Health Maintenance Due  Topic Date Due   COVID-19 Vaccine (1) Never done   FOOT EXAM  Never done   OPHTHALMOLOGY EXAM  Never done   Diabetic kidney evaluation - Urine ACR  Never done   Hepatitis C Screening  Never done   TETANUS/TDAP  Never done   INFLUENZA VACCINE  Never done     Objective: Lab Results  Component Value Date   HGBA1C 10.7 (A) 01/11/2022   HGBA1C 10.7 01/11/2022   HGBA1C 10.7 (A) 01/11/2022   HGBA1C 10.7 (A) 01/11/2022    Lab Results  Component Value Date   CREATININE 3.15 (H) 12/13/2021   BUN 26 (H) 12/13/2021   NA 137 12/13/2021   K 3.8 12/13/2021   CL 108 12/13/2021   CO2 20 (L) 12/13/2021    Lab Results  Component Value Date   CHOL 190 09/18/2019   HDL 39 (L) 09/18/2019   LDLCALC 126 (H) 09/18/2019   TRIG 124 09/18/2019   CHOLHDL 4.9 09/18/2019    Medications Reviewed Today     Reviewed by HOsker Mason RPH-CPP (Pharmacist) on 01/30/22 at 1003  Med List Status: <None>   Medication Order Taking? Sig Documenting Provider Last Dose Status Informant  amLODipine (NORVASC) 10 MG tablet [id number] Take 1 tablet (10 mg total) by mouth daily. SRaiford Noble[place]ildford [place] DO Taking Active Self  blood glucose meter kit and supplies  KIT [id number] Yes Dispense based on patient and insurance preference. Use up to four times daily as directed. Aline August, MD Taking Active   Continuous Blood Gluc Sensor (FREESTYLE LIBRE 3 SENSOR) [place] [id number] Yes Place 1 sensor on the skin every 14 days. Use to check glucose continuously [name], NP Taking Active   gabapentin (NEURONTIN) 100 MG capsule [id number] Yes Take 1 capsule (100 mg total) by mouth 3 (three)  times daily. [name], NP Taking Active   HUMALOG KWIKPEN 100 UNIT/ML KwikPen [id number] No Inject 10 Units into the skin 3 (three) times daily.  Patient not taking: Reported on 01/30/2022   [name], NP Not Taking Active   hydrALAZINE (APRESOLINE) 25 MG tablet [id number] Yes Take 75 mg by mouth in the morning, at noon, and at bedtime. [provider] Taking Active Self  Insulin Glargine (BASAGLAR KWIKPEN) 100 UNIT/ML [id number] No Inject 40 Units into the skin daily.  Patient not taking: Reported on 01/30/2022   [name], NP Not Taking Active   Insulin Pen Needle 32G X 4 MM MISC [id number] Yes Glargine 50 units daily and Novolog 10 units TID with meals Alekh, Kshitiz, MD Taking Active   JYNARQUE 90 & 30 MG TBPK [id number] No Take 1 tablet by mouth in the morning and at bedtime. [provider] Unknown Active Self  metoprolol succinate (TOPROL-XL) 50 MG 24 hr tablet [id number] Yes Take 50 mg by mouth daily. [provider] Taking Active Self  Semaglutide,0.25 or 0.5MG/DOS, (OZEMPIC, 0.25 OR 0.5 MG/DOSE,) 2 MG/3ML SOPN [id number] Yes Inject 0.25 mg into the skin once a week.  Patient taking differently: Inject 0.5 mg into the skin once a week.   [name], NP Taking Active   sodium bicarbonate 650 MG tablet [id number] Yes Take 650 mg by mouth 2 (two) times daily. [provider] Taking Active Self  tiZANidine (ZANAFLEX) 4 MG tablet [id number] Yes Take 1 tablet (4 mg total) by mouth at bedtime. [name], NP Taking Active               Assessment/Plan:   Diabetes: - Currently controlled per CGM downloads - Educated on typical lag time/differences between blood glucose and interstitial tissue glucose.  - Discussed how to turn off glucose <70 alarm as patient is no longer on insulin. Discussed that Trulicity is typically less potent than Ozempic so we anticipate he will do well on Trulicity 1.5 mg weekly as prescribed by  nephrology. Recommend to start that therapy as prescribed.  - Recommend to continue to monitor glucose  - Praised for focus on dietary/lifestyle modifications - Patient requested refill on One Touch glucose strips. Will send per Primary Care Standing Order. Educated patient that there is no clinical benefit of comparing Libre results to finger sticks   Hypertension: - Currently uncontrolled - Reviewed long term cardiovascular and renal outcomes of uncontrolled blood pressure - Reviewed appropriate blood pressure monitoring technique and reviewed goal blood pressure. Recommended to check home blood pressure and heart rate at least three times weekly before next appointment - Recommend to continue current regimen.     Follow Up Plan: phone call in 2 weeks  Catie TJodi Mourning, PharmD, [facility] [phone]

## 2022-02-13 ENCOUNTER — Other Ambulatory Visit: Payer: Commercial Managed Care - HMO | Admitting: Pharmacist

## 2022-02-13 NOTE — Progress Notes (Signed)
Chief Complaint  Patient presents with   Diabetes   Hypertension   Hyperlipidemia   Medication Management    Kevin Todd is a 46 y.o. year old male who presented for a telephone visit.   They were referred to the pharmacist by their PCP for assistance in managing diabetes.    Subjective:  Care Team: Primary Care Provider: [name], NP ; Next Scheduled Visit: 03/13/22  Medication Access/Adherence  [facility]  [id number]: [phone]: [phone]  Patient reports affordability concerns with their medications: No  Patient reports access/transportation concerns to their pharmacy: No  Patient reports adherence concerns with their medications:  No     Diabetes:  Current medications: Trulicity 1.5 mg weekly Medications tried in the past: renal function inappropriate for metformin; no longer needing insulin therapy  Date of Download: 9/19-10/2/23 % Time CGM is active: 89% Average Glucose: 101 mg/dL Glucose Management Indicator: 5.7  Glucose Variability: 29.2 (goal <36%) Time in Goal:  - Time in range 70-180: 90% - Time above range: 2% - Time below range: 8%   Patient denies hypoglycemic s/sx including dizziness, shakiness, sweating.   Hypertension:  Current medications: amlodipine 10 mg daily, hydralazine 75 mg three times daily, metoprolol succinate 50 mg daily  Patient has an upper arm home BP cuff Current blood pressure readings readings: reports ranges from SBP 140-160 at home   Hyperlipidemia/ASCVD Risk Reduction  Current lipid lowering medications: none  Clinical ASCVD: No  The 10-year ASCVD risk score (Arnett DK, et al., 2019) is: 33.1%   Values used to calculate the score:     Age: [age]years     Sex: Male     Is Non-Hispanic African American: Yes     Diabetic: Yes     Tobacco smoker: Yes      Systolic Blood Pressure: 1657mmHg     Is BP treated: Yes     HDL Cholesterol: 39 mg/dL     Total Cholesterol: 190 mg/dL    Health Maintenance  Health Maintenance Due  Topic Date Due   COVID-19 Vaccine (1) Never done   FOOT EXAM  Never done   OPHTHALMOLOGY EXAM  Never done   Diabetic kidney evaluation - Urine ACR  Never done   Hepatitis C Screening  Never done   TETANUS/TDAP  Never done   INFLUENZA VACCINE  Never done     Objective: Lab Results  Component Value Date   HGBA1C 10.7 (A) 01/11/2022   HGBA1C 10.7 01/11/2022   HGBA1C 10.7 (A) 01/11/2022   HGBA1C 10.7 (A) 01/11/2022    Lab Results  Component Value Date   CREATININE 3.15 (H) 12/13/2021   BUN 26 (H) 12/13/2021   NA 137 12/13/2021   K 3.8 12/13/2021   CL 108 12/13/2021   CO2 20 (L) 12/13/2021    Lab Results  Component Value Date   CHOL 190 09/18/2019   HDL 39 (L) 09/18/2019   LDLCALC 126 (H) 09/18/2019   TRIG 124 09/18/2019   CHOLHDL 4.9 09/18/2019    Medications Reviewed Today     Reviewed by HOsker Mason RPH-CPP (Pharmacist) on 02/13/22 at 1158  Med List Status: <None>   Medication Order Taking? Sig Documenting Provider Last Dose Status Informant  amLODipine (NORVASC) 10 MG tablet [id number] Take 1 tablet (10 mg total) by mouth daily. SRaiford Noble  Latif, DO Taking Active Self  blood glucose meter kit and supplies KIT [id number]  Dispense based on patient and insurance preference. Use up to four times daily as directed. Aline August, MD  Active   Continuous Blood Gluc Sensor (FREESTYLE LIBRE 3 SENSOR) [place] [id number] Yes Place 1 sensor on the skin every 14 days. Use to check glucose continuously [name], NP Taking Active   furosemide (LASIX) 40 MG tablet [id number] No Take 40 mg by mouth daily.  Patient not taking: Reported on 02/13/2022   [provider] Not Taking Active   gabapentin (NEURONTIN) 100 MG capsule [id number] Yes Take 1 capsule (100 mg total) by mouth 3 (three)  times daily. [name], NP Taking Active   glucose blood test strip [id number] Yes Use as instructed to check glucose [name], NP Taking Active   hydrALAZINE (APRESOLINE) 25 MG tablet [id number] Yes Take 75 mg by mouth in the morning, at noon, and at bedtime. [provider] Taking Active Self  JYNARQUE 90 & 30 MG TBPK [id number] Yes Take 1 tablet by mouth in the morning and at bedtime. [provider] Taking Active Self  metoprolol succinate (TOPROL-XL) 50 MG 24 hr tablet [id number] Yes Take 50 mg by mouth daily. [provider] Taking Active Self  sodium bicarbonate 650 MG tablet [id number] Yes Take 650 mg by mouth 2 (two) times daily. [provider] Taking Active Self  tiZANidine (ZANAFLEX) 4 MG tablet [id number] Yes Take 1 tablet (4 mg total) by mouth at bedtime. [name], NP Taking Active   TRULICITY 1.5 ID/0.3UD [place] [facility] [id number] Yes Inject 1.5 mg into the skin once a week. [provider] Taking Active               Assessment/Plan:   Diabetes: - Currently controlled per CGM downloads - Reviewed goal time in range of 80-180 - Recommend to continue current regimen at this time. Patient will not be due for A1c until after 11/30, last A1c was 8/30. Will collaborate with PCP to determine if visit needs to be pushed out  Hypertension: - Currently uncontrolled per patient's reported home BP readings - Reviewed long term cardiovascular and renal outcomes of uncontrolled blood pressure - Reviewed appropriate blood pressure monitoring technique and reviewed goal blood pressure. Recommended to check home blood pressure and heart rate twice daily, document, and take with him to nephrology appointment next week. Encouraged to take home cuff for validation - Recommend to continue current regimen at this time  Hyperlipidemia/ASCVD Risk Reduction: - Currently untreated.  - Reviewed long term complications of uncontrolled  cholesterol - Recommend to update lipid panel with next lab work and start statin therapy for primary prevention    Follow Up Plan: phone call in ~ 4 weeks  Catie Hedwig Morton, PharmD, [facility] [phone]

## 2022-02-13 NOTE — Patient Instructions (Signed)
It was great talking to you today!  Check your blood pressure twice daily, and any time you have concerning symptoms like headache, chest pain, dizziness, shortness of breath, or vision changes.   Our goal is less than 130/80.  To appropriately check your blood pressure, make sure you do the following:  1) Avoid caffeine, exercise, or tobacco products for 30 minutes before checking. Empty your bladder. 2) Sit with your back supported in a flat-backed chair. Rest your arm on something flat (arm of the chair, table, etc). 3) Sit still with your feet flat on the floor, resting, for at least 5 minutes.  4) Check your blood pressure. Take 1-2 readings.  5) Write down these readings and bring with you to any provider appointments.  Bring your home blood pressure machine with you to a provider's office for accuracy comparison at least once a year.   Make sure you take your blood pressure medications before you come to any office visit, even if you were asked to fast for labs.  Take care!  Catie Hedwig Morton, PharmD, [facility] [phone]

## 2022-02-14 ENCOUNTER — Ambulatory Visit: Payer: Commercial Managed Care - HMO | Admitting: Skilled Nursing Facility1

## 2022-02-17 ENCOUNTER — Telehealth: Payer: Self-pay | Admitting: Nurse Practitioner

## 2022-02-17 NOTE — Telephone Encounter (Signed)
Libre sensor questionplease call pt back

## 2022-02-22 ENCOUNTER — Other Ambulatory Visit: Payer: Self-pay | Admitting: Nurse Practitioner

## 2022-02-22 DIAGNOSIS — R252 Cramp and spasm: Secondary | ICD-10-CM

## 2022-02-22 NOTE — Telephone Encounter (Signed)
Catie could you please check into this.

## 2022-02-22 NOTE — Telephone Encounter (Signed)
Called patient. Left voicemail for him to return my call at his convenience.   Catie Hedwig Morton, PharmD, [facility] [phone]

## 2022-03-13 ENCOUNTER — Ambulatory Visit: Payer: Self-pay | Admitting: Nurse Practitioner

## 2022-03-20 ENCOUNTER — Telehealth: Payer: Self-pay | Admitting: Pharmacist

## 2022-03-20 ENCOUNTER — Other Ambulatory Visit: Payer: Commercial Managed Care - HMO | Admitting: Pharmacist

## 2022-03-20 NOTE — Progress Notes (Unsigned)
Attempted to contact patient for scheduled appointment for medication management. Left HIPAA compliant message for patient to return my call at their convenience.   Catie T. Dannis Deroche, PharmD, BCACP [facility] [phone]  

## 2022-04-21 ENCOUNTER — Ambulatory Visit (INDEPENDENT_AMBULATORY_CARE_PROVIDER_SITE_OTHER): Payer: Commercial Managed Care - HMO | Admitting: Nurse Practitioner

## 2022-04-21 ENCOUNTER — Encounter: Payer: Self-pay | Admitting: Nurse Practitioner

## 2022-04-21 VITALS — BP 144/87 | HR 86 | Temp 97.6°F | Ht 68.0 in | Wt 306.0 lb

## 2022-04-21 DIAGNOSIS — N184 Chronic kidney disease, stage 4 (severe): Secondary | ICD-10-CM | POA: Diagnosis not present

## 2022-04-21 DIAGNOSIS — N529 Male erectile dysfunction, unspecified: Secondary | ICD-10-CM

## 2022-04-21 DIAGNOSIS — E1122 Type 2 diabetes mellitus with diabetic chronic kidney disease: Secondary | ICD-10-CM

## 2022-04-21 LAB — POCT GLYCOSYLATED HEMOGLOBIN (HGB A1C): Hemoglobin A1C: 5.9 % — AB (ref 4.0–5.6)

## 2022-04-21 MED ORDER — SILDENAFIL CITRATE 25 MG PO TABS: 25.0000 mg | ORAL_TABLET | Freq: Every day | ORAL | 0 refills | Status: DC | PRN

## 2022-04-21 NOTE — Progress Notes (Signed)
_0  ID: Kevin Todd, male    DOB: 11-01-1975, 46 y.o.   MRN: [id number]  Chief Complaint  Patient presents with   Diabetes    Referring provider: [name], NP   HPI  46 year old male with history of hypertension, type 2 diabetes, CKD, tobacco abuse, and obesity.   Patient presents today for diabetes follow-up.  He has been using Trulicity.  His A1c has dramatically improved and is at 5.9 now.  He has been followed by pharmacy.  Patient's dosage may need to be decreased at this point.  Patient is requesting something for erectile dysfunction. Denies f/c/s, n/v/d, hemoptysis, PND, leg swelling Denies chest pain or edema       Allergies  Allergen Reactions   Nsaids     Kidney Disease     There is no immunization history on file for this patient.  Past Medical History:  Diagnosis Date   Chronic kidney disease    Elevated creatine kinase 09/2019   Hypertension    Iliac crest spur of left hip    Insomnia 10/2019   Pneumothorax on right    Polycystic kidney disease    Vitamin D deficiency 09/2019    Tobacco History: Social History   Tobacco Use  Smoking Status Former   Packs/day: 0.25   Types: Cigarettes  Smokeless Tobacco Never  Tobacco Comments   4 cigarettes/pd recently quit 03/2021   Counseling given: Not Answered Tobacco comments: 4 cigarettes/pd recently quit 03/2021   Outpatient Encounter Medications as of 04/21/2022  Medication Sig   amLODipine (NORVASC) 10 MG tablet Take 1 tablet (10 mg total) by mouth daily.   blood glucose meter kit and supplies KIT Dispense based on patient and insurance preference. Use up to four times daily as directed.   Continuous Blood Gluc Sensor (FREESTYLE LIBRE 3 SENSOR) MISC Place 1 sensor on the skin every 14 days. Use to check glucose continuously   furosemide (LASIX) 40 MG tablet Take 40 mg by mouth daily.   gabapentin (NEURONTIN) 100 MG capsule Take 1 capsule (100 mg total) by mouth 3 (three) times  daily.   glucose blood test strip Use as instructed to check glucose   hydrALAZINE (APRESOLINE) 25 MG tablet Take 75 mg by mouth in the morning, at noon, and at bedtime.   JYNARQUE 90 & 30 MG TBPK Take 1 tablet by mouth in the morning and at bedtime.   metoprolol succinate (TOPROL-XL) 50 MG 24 hr tablet Take 50 mg by mouth daily.   sildenafil (VIAGRA) 25 MG tablet Take 1 tablet (25 mg total) by mouth daily as needed for erectile dysfunction.   sodium bicarbonate 650 MG tablet Take 650 mg by mouth 2 (two) times daily.   tiZANidine (ZANAFLEX) 4 MG tablet Take 1 tablet (4 mg total) by mouth at bedtime.   TRULICITY 1.5 GY/5.6LS SOPN Inject 1.5 mg into the skin once a week.   No facility-administered encounter medications on file as of 04/21/2022.     Review of Systems  Review of Systems  Constitutional: Negative.   HENT: Negative.    Cardiovascular: Negative.   Gastrointestinal: Negative.   Allergic/Immunologic: Negative.   Neurological: Negative.   Psychiatric/Behavioral: Negative.         Physical Exam  BP (!) 144/87   Pulse 86   Temp 97.6 F (36.4 C)   Ht _1  (1.727 m)   Wt (!) 306 lb (138.8 kg)   SpO2 99%   BMI 46.53  kg/m   Wt Readings from Last 5 Encounters:  04/21/22 (!) 306 lb (138.8 kg)  01/11/22 (!) 304 lb 8 oz (138.1 kg)  12/28/21 (!) 306 lb (138.8 kg)  12/21/21 (!) 307 lb (139.3 kg)  12/13/21 (!) 303 lb 12.7 oz (137.8 kg)     Physical Exam Vitals and nursing note reviewed.  Constitutional:      General: He is not in acute distress.    Appearance: He is well-developed.  Cardiovascular:     Rate and Rhythm: Normal rate and regular rhythm.  Pulmonary:     Effort: Pulmonary effort is normal.     Breath sounds: Normal breath sounds.  Skin:    General: Skin is warm and dry.  Neurological:     Mental Status: He is alert and oriented to person, place, and time.      Lab Results:  CBC    Component Value Date/Time   WBC 6.9 12/11/2021 0801   RBC  3.69 (L) 12/11/2021 0801   HGB 11.7 (L) 12/11/2021 0801   HGB 11.5 (L) 09/29/2019 1402   HCT 33.8 (L) 12/11/2021 0801   HCT 33.3 (L) 09/29/2019 1402   PLT 245 12/11/2021 0801   PLT 349 09/29/2019 1402   MCV 91.6 12/11/2021 0801   MCV 88 09/29/2019 1402   MCH 31.7 12/11/2021 0801   MCHC 34.6 12/11/2021 0801   RDW 13.4 12/11/2021 0801   RDW 13.3 09/29/2019 1402   LYMPHSABS 1.3 12/11/2021 0801   LYMPHSABS 1.6 09/29/2019 1402   MONOABS 0.4 12/11/2021 0801   EOSABS 0.1 12/11/2021 0801   EOSABS 0.2 09/29/2019 1402   BASOSABS 0.1 12/11/2021 0801   BASOSABS 0.1 09/29/2019 1402    BMET    Component Value Date/Time   NA 137 12/13/2021 0413   NA 142 09/29/2019 1402   K 3.8 12/13/2021 0413   CL 108 12/13/2021 0413   CO2 20 (L) 12/13/2021 0413   GLUCOSE 253 (H) 12/13/2021 0413   BUN 26 (H) 12/13/2021 0413   BUN 33 (H) 09/29/2019 1402   CREATININE 3.15 (H) 12/13/2021 0413   CALCIUM 8.8 (L) 12/13/2021 0413   GFRNONAA 24 (L) 12/13/2021 0413   GFRAA 29 (L) 09/29/2019 1402     Assessment & Plan:   Type 2 diabetes mellitus with stage 4 chronic kidney disease, without long-term current use of insulin (HCC) - POCT glycosylated hemoglobin (Hb A1C) - CBC - Comprehensive metabolic panel - Lipid Panel   Follow up:  Follow up in 3 months or sooner if needed     [name], NP 04/21/2022

## 2022-04-21 NOTE — Patient Instructions (Signed)
1. Type 2 diabetes mellitus with stage 4 chronic kidney disease, without long-term current use of insulin (HCC)  - POCT glycosylated hemoglobin (Hb A1C) - CBC - Comprehensive metabolic panel - Lipid Panel   Follow up:  Follow up in 3 months or sooner if needed

## 2022-04-21 NOTE — Assessment & Plan Note (Signed)
-   POCT glycosylated hemoglobin (Hb A1C) - CBC - Comprehensive metabolic panel - Lipid Panel   Follow up:  Follow up in 3 months or sooner if needed

## 2022-04-22 LAB — CBC
Hematocrit: 35.3 % — ABNORMAL LOW (ref 37.5–51.0)
Hemoglobin: 11.7 g/dL — ABNORMAL LOW (ref 13.0–17.7)
MCH: 30.1 pg (ref 26.6–33.0)
MCHC: 33.1 g/dL (ref 31.5–35.7)
MCV: 91 fL (ref 79–97)
Platelets: 284 10*3/uL (ref 150–450)
RBC: 3.89 x10E6/uL — ABNORMAL LOW (ref 4.14–5.80)
RDW: 14.6 % (ref 11.6–15.4)
WBC: 7 10*3/uL (ref 3.4–10.8)

## 2022-04-22 LAB — COMPREHENSIVE METABOLIC PANEL
ALT: 18 IU/L (ref 0–44)
AST: 15 IU/L (ref 0–40)
Albumin/Globulin Ratio: 2.1 (ref 1.2–2.2)
Albumin: 4.1 g/dL (ref 4.1–5.1)
Alkaline Phosphatase: 83 IU/L (ref 44–121)
BUN/Creatinine Ratio: 8 — ABNORMAL LOW (ref 9–20)
BUN: 29 mg/dL — ABNORMAL HIGH (ref 6–24)
Bilirubin Total: <0.2 mg/dL (ref 0.0–1.2)
CO2: 19 mmol/L — ABNORMAL LOW (ref 20–29)
Calcium: 9 mg/dL (ref 8.7–10.2)
Chloride: 106 mmol/L (ref 96–106)
Creatinine, Ser: 3.75 mg/dL — ABNORMAL HIGH (ref 0.76–1.27)
Globulin, Total: 2 g/dL (ref 1.5–4.5)
Glucose: 171 mg/dL — ABNORMAL HIGH (ref 70–99)
Potassium: 4.2 mmol/L (ref 3.5–5.2)
Sodium: 142 mmol/L (ref 134–144)
Total Protein: 6.1 g/dL (ref 6.0–8.5)
eGFR: 19 mL/min/{1.73_m2} — ABNORMAL LOW (ref >59)

## 2022-04-22 LAB — LIPID PANEL
Chol/HDL Ratio: 6.3 ratio — ABNORMAL HIGH (ref 0.0–5.0)
Cholesterol, Total: 208 mg/dL — ABNORMAL HIGH (ref 100–199)
HDL: 33 mg/dL — ABNORMAL LOW (ref >39)
LDL Chol Calc (NIH): 138 mg/dL — ABNORMAL HIGH (ref 0–99)
Triglycerides: 203 mg/dL — ABNORMAL HIGH (ref 0–149)
VLDL Cholesterol Cal: 37 mg/dL (ref 5–40)

## 2022-04-26 ENCOUNTER — Other Ambulatory Visit: Payer: Self-pay | Admitting: Nurse Practitioner

## 2022-04-26 DIAGNOSIS — E782 Mixed hyperlipidemia: Secondary | ICD-10-CM

## 2022-04-26 MED ORDER — SIMVASTATIN 20 MG PO TABS: 20.0000 mg | ORAL_TABLET | Freq: Every evening | ORAL | 11 refills | Status: DC

## 2022-05-05 ENCOUNTER — Telehealth: Payer: Self-pay

## 2022-05-05 NOTE — Telephone Encounter (Signed)
[facility] to [facility]. Please advise [facility]

## 2022-05-11 ENCOUNTER — Other Ambulatory Visit: Payer: Self-pay | Admitting: Nurse Practitioner

## 2022-05-11 DIAGNOSIS — N529 Male erectile dysfunction, unspecified: Secondary | ICD-10-CM

## 2022-05-11 MED ORDER — SILDENAFIL CITRATE 25 MG PO TABS: 25.0000 mg | ORAL_TABLET | Freq: Every day | ORAL | 0 refills | Status: DC | PRN

## 2022-05-15 IMAGING — MR MR ANKLE*R* W/O CM
4 of 6 series · 13 of 40 positions shown · non-contrast
Comparison: None.

CLINICAL DATA: Right ankle pain and swelling

EXAM:
MRI OF THE RIGHT ANKLE WITHOUT CONTRAST
TECHNIQUE: Multiplanar, multisequence MR imaging of the ankle was performed. No
intravenous contrast was administered.

[Series 3: T2 fat-sat · axial · right · 3.0mm · 0.30mm/px · z∈[-77,+22]mm · 3 of 35 slices shown (1 of 2)]
[im 5/35]
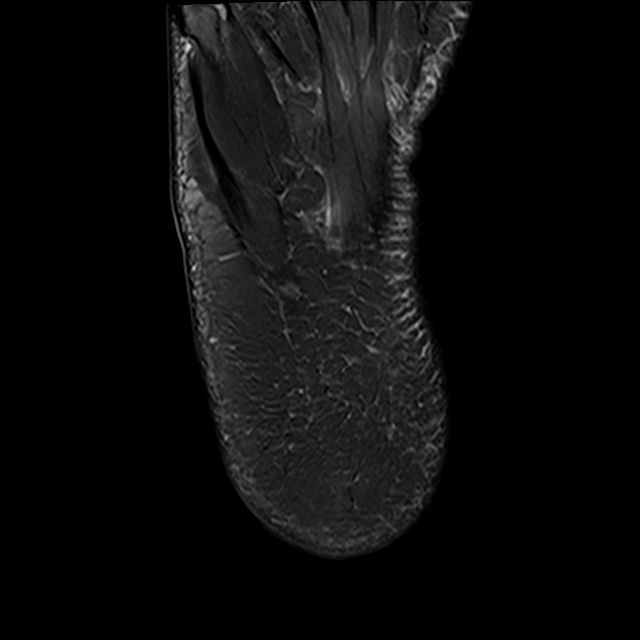
[im 18/35]
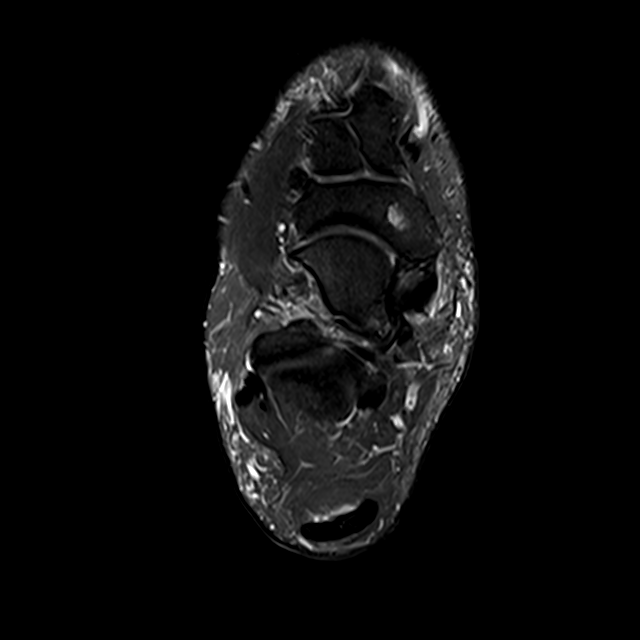
[im 30/35]
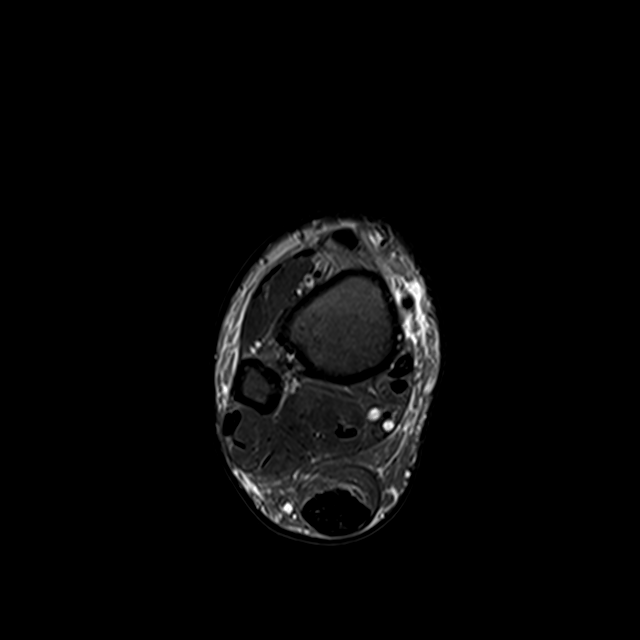

[Series 4: T1 · axial · right · 3.0mm · 0.30mm/px · z∈[-93,+38]mm · 3 of 12 slices shown]
[im 1/12]
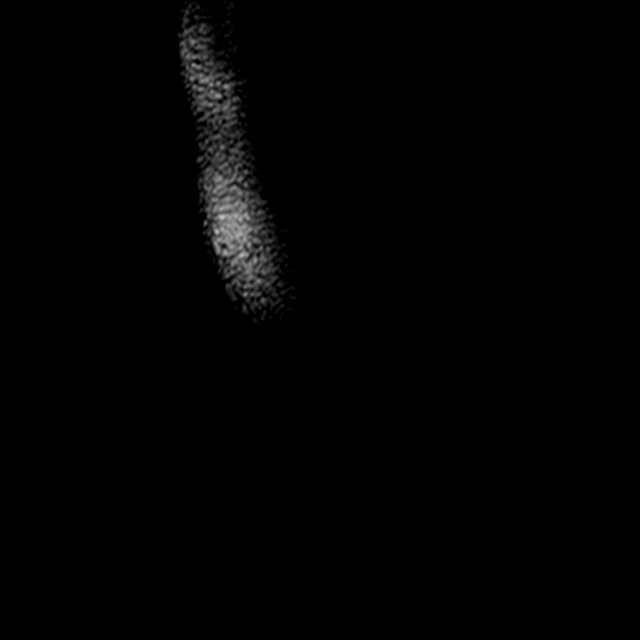
[im 6/12]
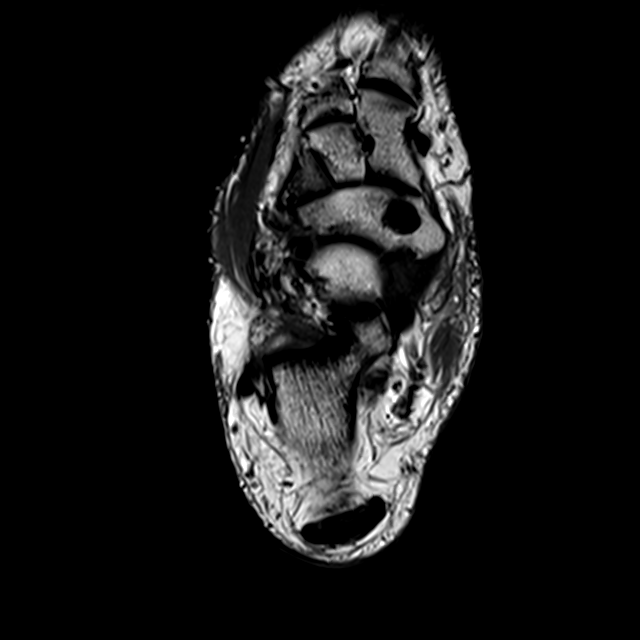
[im 12/12]
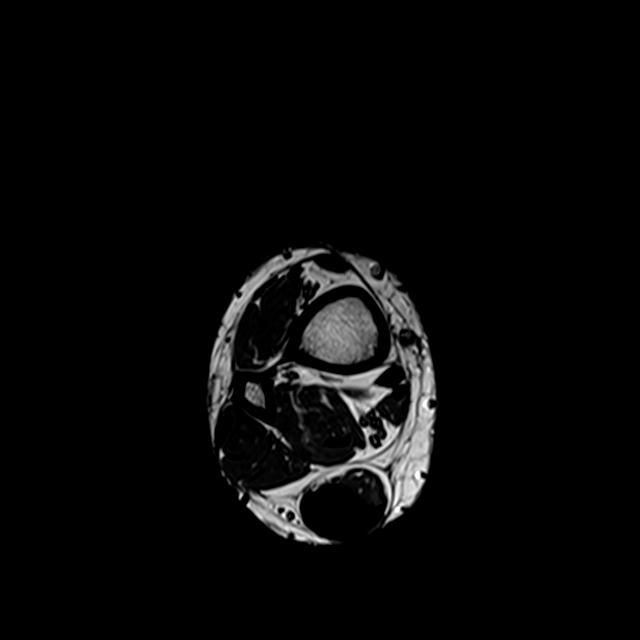

[Series 7: T2 fat-sat · coronal · right · 3.0mm · 0.27mm/px · 3 of 44 slices shown (2 of 2)]
[im 5/44]
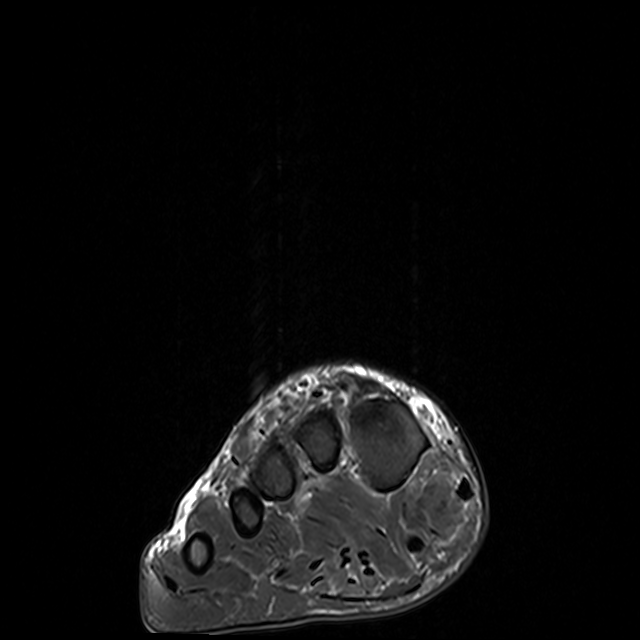
[im 24/44]
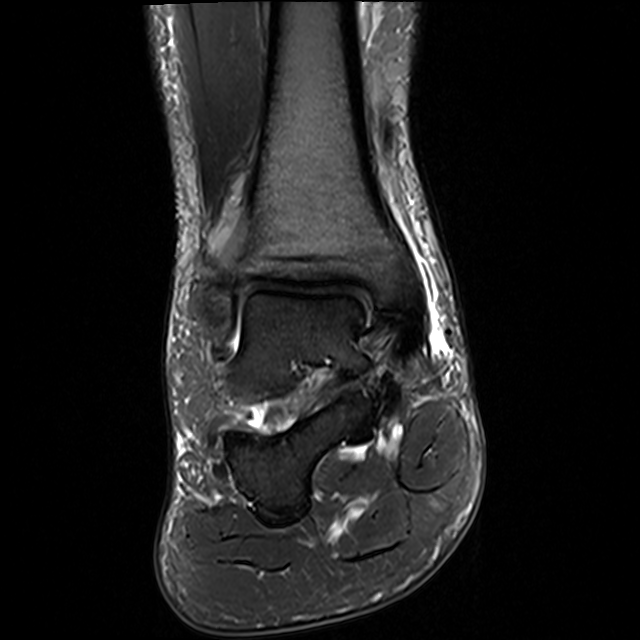
[im 39/44]
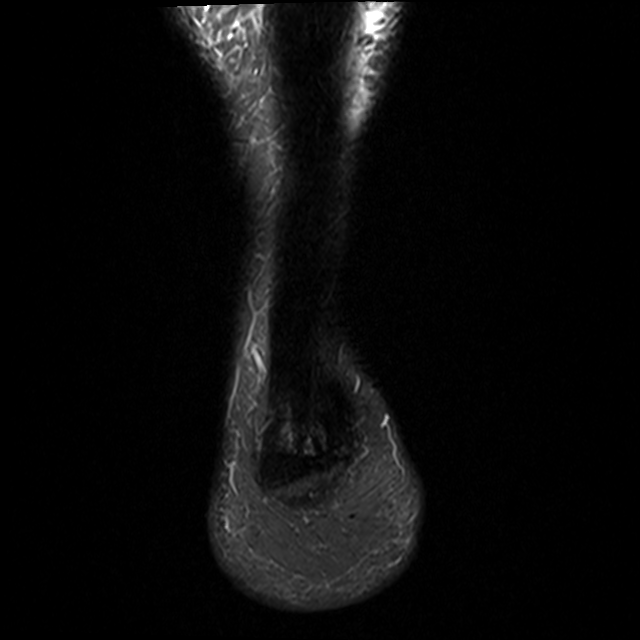

[Series 8: PD fat-sat · axial · right · 3.0mm · 0.30mm/px · z∈[-93,+22]mm · 4 of 35 slices shown]
[im 1/35]
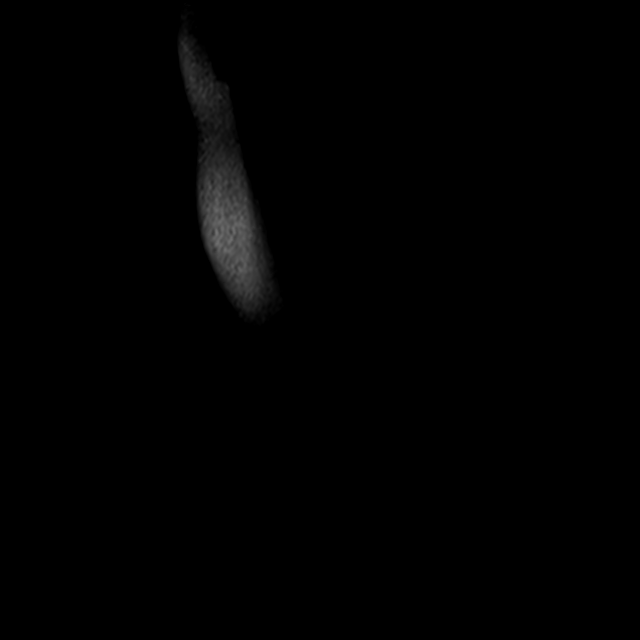
[im 5/35]
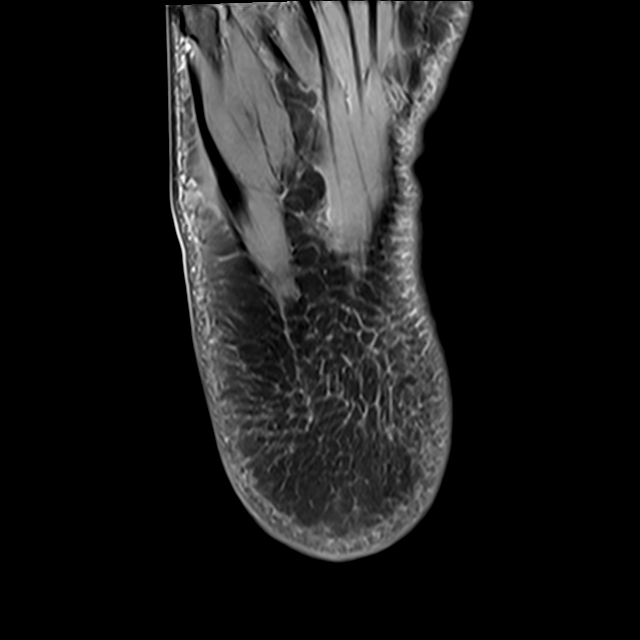
[im 20/35]
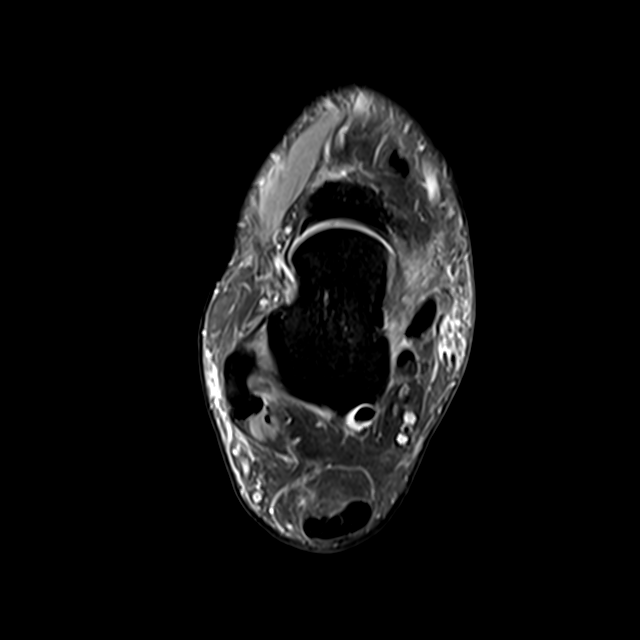
[im 30/35]
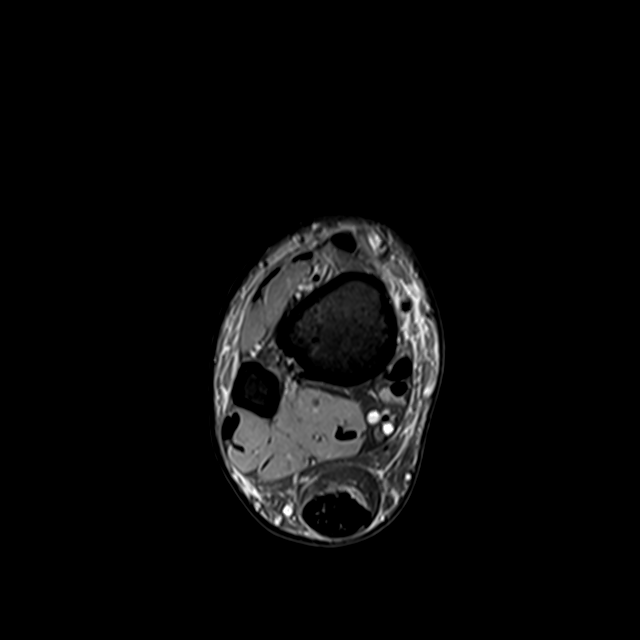

[13 of 40 positions shown; findings below may reference images not displayed]

FINDINGS: TENDONS

Peroneal: Peroneal longus and brevis tendons are intact.

Posteromedial: Mild tenosynovitis of the posterior tibial tendon
above and below the medial malleolus. Flexor digitorum longus tendon
intact. Flexor hallucis longus tendon intact.

Anterior: Tibialis anterior tendon intact. Extensor hallucis longus
tendon intact Extensor digitorum longus tendon intact.

Achilles: The Achilles tendon is intact, though demonstrating long
segment severe tendinosis centered at the level of the myotendinous
junction. There is mild paratendinitis and minimal retrocalcaneal
bursitis. Insertional Achilles enthesophyte formation on the
calcaneus.

Plantar Fascia: Intact.

LIGAMENTS

Lateral: Anterior talofibular ligament intact. Calcaneofibular
ligament intact. Posterior talofibular ligament intact. Anterior and
posterior tibiofibular ligaments intact.

Medial: Deltoid ligament intact. Spring ligament intact.

CARTILAGE

Ankle Joint: No joint effusion. Normal ankle mortise. No chondral
defect.

Subtalar Joints/Sinus Tarsi: Normal subtalar joints. No subtalar
joint effusion. Normal sinus tarsi.

Bones: No marrow signal abnormality. No fracture or dislocation.
Intraosseous ganglia or subchondral cyst formation within the medial
navicular bone.

Soft Tissue: No fluid collection or hematoma. Muscles are normal
without edema or atrophy. Tarsal tunnel is normal.
IMPRESSION: Severe long segment tendinosis of the Achilles tendon centered at
the myotendinous junction, without evidence of acute tendon tear.
Minimal paratendinitis and retrocalcaneal bursitis.

Mild tenosynovitis of the posterior tibial tendon above and below
the medial malleolus.

Intact ankle ligaments.

## 2022-05-17 ENCOUNTER — Other Ambulatory Visit: Payer: Commercial Managed Care - HMO | Admitting: Pharmacist

## 2022-05-17 DIAGNOSIS — E1122 Type 2 diabetes mellitus with diabetic chronic kidney disease: Secondary | ICD-10-CM

## 2022-05-17 MED ORDER — ROSUVASTATIN CALCIUM 10 MG PO TABS: 10.0000 mg | ORAL_TABLET | Freq: Every day | ORAL | 3 refills | Status: DC

## 2022-05-17 NOTE — Progress Notes (Signed)
05/17/2022 Name: Kevin Todd MRN: [id number] DOB: Apr 12, 1976  Chief Complaint  Patient presents with   Medication Management    Diabetes, Hypertension, Hyperlipidemia    Kevin Todd is a 47 y.o. year old male who presented for a telephone visit.   They were referred to the pharmacist by their PCP for assistance in managing diabetes and hypertension.   Subjective:  Care Team: Primary Care Provider: [name], NP ; Next Scheduled Visit: 07/21/2022  Medication Access/Adherence  Current Pharmacy[facility]  [id number] [place] [id number]: [phone]: [phone]  Patient reports affordability concerns with their medications: No  Patient reports access/transportation concerns to their pharmacy: No  Patient reports adherence concerns with their medications:  No    Diabetes:  Current medications: Trulicity 1.5 mg weekly (Saturday's) Medications tried in the past: renal function inappropriate for metformin; no longer needing insulin therapy   Date of Download: 04/23/22-05/06/2022 % Time CGM is active: 85% Average Glucose: 102 mg/dL Glucose Management Indicator: 5.7%  Glucose Variability: 23.7% (goal <36%) Time in Goal:  - Time in range 70-180: 95% - Time above range: 1% - Time below range: 4%  Patient denies hypoglycemic s/sx including dizziness, shakiness, sweating. Patient denies hyperglycemic symptoms including polyuria, polydipsia, polyphagia, nocturia, neuropathy, blurred vision.  Patient denies GI upset with Trulicity. Reports nocturia 2-3x/night   Hypertension:  Current medications: amlodipine 10 mg daily, hydralazine 100 mg BID, metoprolol succinate 50 mg daily, Lasix 40 mg daily   Patient has a validated, automated, upper arm home BP cuff. Checks 2-3x/week.  Current blood pressure readings readings: 140s/80s  Patient denies hypotensive s/sx including  dizziness, lightheadedness.  Patient denies hypertensive symptoms including headache, chest pain, shortness of breath  Hyperlipidemia/ASCVD Risk Reduction  Current lipid lowering medications: simvastatin 20 mg daily (recently started)   The 10-year ASCVD risk score (Arnett DK, et al., 2019) is: 29.9%   Values used to calculate the score:     Age: 89years     Sex: Male     Is Non-Hispanic African American: Yes     Diabetic: Yes     Tobacco smoker: Yes     Systolic Blood Pressure: 1916mmHg     Is BP treated: Yes     HDL Cholesterol: 33 mg/dL     Total Cholesterol: 208 mg/dL   Health Maintenance  Health Maintenance Due  Topic Date Due   COVID-19 Vaccine (1) Never done   FOOT EXAM  Never done   OPHTHALMOLOGY EXAM  Never done   Diabetic kidney evaluation - Urine ACR  Never done   Hepatitis C Screening  Never done   DTaP/Tdap/Td (1 - Tdap) Never done     Objective: Lab Results  Component Value Date   HGBA1C 5.9 (A) 04/21/2022    Lab Results  Component Value Date   CREATININE 3.75 (H) 04/21/2022   BUN 29 (H) 04/21/2022   NA 142 04/21/2022   K 4.2 04/21/2022   CL 106 04/21/2022   CO2 19 (L) 04/21/2022    Lab Results  Component Value Date   CHOL 208 (H) 04/21/2022   HDL 33 (L) 04/21/2022   LDLCALC 138 (H) 04/21/2022   TRIG 203 (H) 04/21/2022   CHOLHDL 6.3 (H) 04/21/2022    Medications Reviewed Today     Reviewed by SPauletta Browns [facility](Pharmacist) on 05/17/22 at 1122  Med List  Status: <None>   Medication Order Taking? Sig Documenting Provider Last Dose Status Informant  amLODipine (NORVASC) 10 MG tablet [id number] Yes Take 1 tablet (10 mg total) by mouth daily. Raiford Noble [place], [place] Taking Active Self  blood glucose meter kit and supplies KIT [id number]  Dispense based on patient and insurance preference. Use up to four times daily as directed. Aline August, MD  Active   Continuous Blood Gluc Sensor (FREESTYLE LIBRE 3 SENSOR) [place] [id number] Yes Place 1  sensor on the skin every 14 days. Use to check glucose continuously [name], NP Taking Active   furosemide (LASIX) 40 MG tablet [id number] Yes Take 40 mg by mouth daily. [provider] Taking Active   gabapentin (NEURONTIN) 100 MG capsule [id number] Yes Take 1 capsule (100 mg total) by mouth 3 (three) times daily. [name], NP Taking Active   glucose blood test strip [id number]  Use as instructed to check glucose [name], NP  Active   hydrALAZINE (APRESOLINE) 25 MG tablet [id number] Yes Take 100 mg by mouth in the morning and at bedtime. [provider] Taking Active Self  JYNARQUE 90 & 30 MG TBPK [id number] Yes Take 1 tablet by mouth in the morning and at bedtime. [provider] Taking Active Self  metoprolol succinate (TOPROL-XL) 50 MG 24 hr tablet [id number] Yes Take 50 mg by mouth daily. [provider] Taking Active Self  sildenafil (VIAGRA) 25 MG tablet [id number] Yes Take 1 tablet (25 mg total) by mouth daily as needed for erectile dysfunction. [name], NP Taking Active   simvastatin (ZOCOR) 20 MG tablet [id number] Yes Take 1 tablet (20 mg total) by mouth every evening. [name], NP Taking Active   sodium bicarbonate 650 MG tablet [id number] Yes Take 650 mg by mouth 2 (two) times daily. [provider] Taking Active Self  tiZANidine (ZANAFLEX) 4 MG tablet [id number] Yes Take 1 tablet (4 mg total) by mouth at bedtime. [name], NP Taking Active   TRULICITY 1.5 PP/5.0DT [place] [facility] [id number] Yes Inject 1.5 mg into the skin once a week. [provider] Taking Active             Assessment/Plan:   Diabetes: - Currently controlled per A1c (down to 5.9% from 10.7%). Congratulated patient on his success!  - Reviewed long term cardiovascular and renal outcomes of uncontrolled blood sugar - Reviewed goal A1c, goal fasting, and goal 2 hour post prandial glucose - Recommend to continue Trulicity 1.5 mg  weekly. Patient denies GI side effects.  - Recommend to place new CGM sensor. Patient recently sick, was not wearing CGM due to alarms but plans to put back on once he feels better.    Hypertension: - Currently uncontrolled per patient report, but close to goal. Endorses he was last seen by nephrology in December and no changes were made to his BP regimen. Reports his nephrologist believes he has some level of white coat hypertension.  - Reviewed long term cardiovascular and renal outcomes of uncontrolled blood pressure - Reviewed appropriate blood pressure monitoring technique and reviewed goal blood pressure. Recommended to check home blood pressure and heart rate daily.  - Recommend to continue current treatment. Patient endorses he has follow up with nephrology in March.    Hyperlipidemia/ASCVD Risk Reduction: - Currently uncontrolled (last LDL 138 on 04/21/2022).  - Recommend to switch simvastatin 20 mg daily to rosuvastatin 10 mg daily. Discussed with PCP and she is in agreement. LFTs stable  04/21/2022.    - Reviewed long term complications of uncontrolled cholesterol  Follow Up Plan:  Pharmacist: PRN PCP: 07/21/2022  Joseph Art, Pharm.D. PGY-2 Ambulatory Care Pharmacy Resident 05/17/2022 11:32 AM

## 2022-05-17 NOTE — Patient Instructions (Signed)
Mr. Grissinger,   It was nice talking to you today! Great job on the progress you've made on your A1c. Keep up the good work! You can [facility] up the new [facility] once you have finished the simvastatin you have on hand.   Your goal blood sugar is 80-130 before eating and less than 180 after eating.  Keep up the good work with diet and exercise. Aim for a diet full of vegetables, fruit and lean meats (chicken, [place], fish). Try to limit salt intake by eating fresh or frozen vegetables (instead of canned), rinse canned vegetables prior to cooking and do not add any additional salt to meals.   Take care!  Joseph Art, PharmD

## 2022-06-06 ENCOUNTER — Other Ambulatory Visit: Payer: Self-pay | Admitting: Nurse Practitioner

## 2022-06-06 DIAGNOSIS — E1122 Type 2 diabetes mellitus with diabetic chronic kidney disease: Secondary | ICD-10-CM

## 2022-06-06 DIAGNOSIS — G8929 Other chronic pain: Secondary | ICD-10-CM

## 2022-06-07 NOTE — Telephone Encounter (Signed)
[facility]. Please advise [facility]

## 2022-07-21 ENCOUNTER — Ambulatory Visit (INDEPENDENT_AMBULATORY_CARE_PROVIDER_SITE_OTHER): Payer: Commercial Managed Care - HMO | Admitting: Nurse Practitioner

## 2022-07-21 ENCOUNTER — Encounter: Payer: Self-pay | Admitting: Nurse Practitioner

## 2022-07-21 VITALS — BP 152/85 | HR 72 | Temp 97.3°F | Ht 68.0 in | Wt 297.0 lb

## 2022-07-21 DIAGNOSIS — N184 Chronic kidney disease, stage 4 (severe): Secondary | ICD-10-CM | POA: Diagnosis not present

## 2022-07-21 DIAGNOSIS — N529 Male erectile dysfunction, unspecified: Secondary | ICD-10-CM | POA: Diagnosis not present

## 2022-07-21 DIAGNOSIS — R0681 Apnea, not elsewhere classified: Secondary | ICD-10-CM | POA: Diagnosis not present

## 2022-07-21 DIAGNOSIS — E1122 Type 2 diabetes mellitus with diabetic chronic kidney disease: Secondary | ICD-10-CM | POA: Diagnosis not present

## 2022-07-21 DIAGNOSIS — R0683 Snoring: Secondary | ICD-10-CM | POA: Diagnosis not present

## 2022-07-21 LAB — POCT GLYCOSYLATED HEMOGLOBIN (HGB A1C): Hemoglobin A1C: 5.8 % — AB (ref 4.0–5.6)

## 2022-07-21 MED ORDER — SILDENAFIL CITRATE 25 MG PO TABS: 25.0000 mg | ORAL_TABLET | Freq: Every day | ORAL | 0 refills | Status: DC | PRN

## 2022-07-21 MED ORDER — TRULICITY 1.5 MG/0.5ML ~~LOC~~ SOAJ: 1.5000 mg | SUBCUTANEOUS | 3 refills | Status: DC

## 2022-07-21 NOTE — Assessment & Plan Note (Signed)
-   POCT glycosylated hemoglobin (Hb A1C) - Microalbumin/Creatinine Ratio, Urine - Ambulatory referral to Ophthalmology - Ambulatory referral to Podiatry - TRULICITY 1.5 AY/3.0ZS SOPN; Inject 1.5 mg into the skin once a week.  Dispense: 0.5 mL; Refill: 3  Lab Results  Component Value Date   HGBA1C 5.8 (A) 07/21/2022     2. Snores  - Ambulatory referral to Pulmonology  3. Witnessed episode of apnea  - Ambulatory referral to Pulmonology  4. Erectile dysfunction, unspecified erectile dysfunction type  - sildenafil (VIAGRA) 25 MG tablet; Take 1 tablet (25 mg total) by mouth daily as needed for erectile dysfunction.  Dispense: 10 tablet; Refill: 0   Follow up:  Follow up in 3 months

## 2022-07-21 NOTE — Progress Notes (Signed)
Subjective:    Patient ID: Kevin Todd, male    DOB: [birth year], 47 y.o.   MRN: [id number]  Kevin Todd is a 47 y.o. male who presents for follow-up of Type 2 diabetes mellitus.  Patient is checking home blood sugars.   Home blood sugar records: patient does not check sugars How often is blood sugars being checked: freestyle libre Current symptoms/problems include none and have been stable.   Patient states that he would like referral to pulmonary for sleep study.  He states that he has had recent witnessed apneic episodes.  He states that he is a heavy snorer.  Will place referral today.  Patient is compliant with medications for hypertension.  His blood pressure was slightly elevated in office today.  He states that he does check his blood pressures at home and they usually run under 140/90.  Denies f/c/s, n/v/d, hemoptysis, PND, leg swelling Denies chest pain or edema    The following portions of the patient's history were reviewed and updated as appropriate: allergies, current medications, past medical history, past social history and problem list.  Review of Systems  Constitutional: Negative.   HENT: Negative.    Eyes: Negative.   Respiratory: Negative.    Cardiovascular: Negative.   Gastrointestinal: Negative.   Genitourinary: Negative.   Musculoskeletal: Negative.   Skin: Negative.   Neurological: Negative.   Endo/Heme/Allergies: Negative.   Psychiatric/Behavioral: Negative.          Objective:     Physical Exam Constitutional:      General: He is not in acute distress. Cardiovascular:     Rate and Rhythm: Normal rate and regular rhythm.  Pulmonary:     Effort: Pulmonary effort is normal.     Breath sounds: Normal breath sounds.  Skin:    General: Skin is warm and dry.  Neurological:     Mental Status: He is alert and oriented to person, place, and time.  Psychiatric:        Mood and Affect: Mood and affect normal.        Behavior: Behavior normal.       Blood pressure (!) 152/85, pulse 72, temperature (!) 97.3 F (36.3 C), height '5\' 8"'$  (1.727 m), weight 297 lb (134.7 kg), SpO2 100 %.  Lab Review    Latest Ref Rng & Units 07/21/2022   10:27 AM 04/21/2022    9:59 AM 04/21/2022    9:39 AM 01/11/2022   11:22 AM 12/13/2021    4:13 AM  Diabetic Labs  HbA1c 4.0 - 5.6 % 5.8   5.9  10.7    10.7    10.7    10.7    Chol 100 - 199 mg/dL  208      HDL >39 mg/dL  33      Calc LDL 0 - 99 mg/dL  138      Triglycerides 0 - 149 mg/dL  203      Creatinine 0.76 - 1.27 mg/dL  3.75    3.15       07/21/2022   11:23 AM 07/21/2022   10:18 AM 04/21/2022    9:28 AM 01/11/2022   10:50 AM 12/28/2021    1:43 PM  BP/Weight  Systolic BP 0000000 123456 123456 0000000   Diastolic BP 85 74 87 93   Wt. (Lbs)  297 306 304.5 306  BMI  45.16 kg/m2 46.53 kg/m2 46.3 kg/m2 46.53 kg/m2       No data to display  Kevin Todd  reports that he has quit smoking. His smoking use included cigarettes. He smoked an average of .25 packs per day. He has never used smokeless tobacco. He reports that he does not currently use alcohol. He reports that he does not use drugs.     Assessment & Plan:    Type 2 diabetes mellitus with stage 4 chronic kidney disease, without long-term current use of insulin ([place]) - Plan: POCT glycosylated hemoglobin (Hb A1C), Microalbumin/Creatinine Ratio, Urine, Ambulatory referral to Ophthalmology, Ambulatory referral to Podiatry, TRULICITY 1.5 0000000 SOPN  Snores - Plan: Ambulatory referral to Pulmonology  Witnessed episode of apnea - Plan: Ambulatory referral to Pulmonology  Erectile dysfunction, unspecified erectile dysfunction type - Plan: sildenafil (VIAGRA) 25 MG tablet  Rx changes: none Education: Reviewed 'ABCs' of diabetes management (respective goals in parentheses):  A1C (<7), blood pressure (<130/80), and cholesterol (LDL <100). Compliance at present is estimated to be good. Efforts to improve compliance (if necessary) will be directed  at increased exercise. Follow up: 3 months   Lazaro Arms, FNP-C  07/21/22

## 2022-07-21 NOTE — Patient Instructions (Addendum)
1. Type 2 diabetes mellitus with stage 4 chronic kidney disease, without long-term current use of insulin (HCC)  - POCT glycosylated hemoglobin (Hb A1C) - Microalbumin/Creatinine Ratio, Urine - Ambulatory referral to Ophthalmology - Ambulatory referral to Podiatry - TRULICITY 1.5 0000000 SOPN; Inject 1.5 mg into the skin once a week.  Dispense: 0.5 mL; Refill: 3  Lab Results  Component Value Date   HGBA1C 5.8 (A) 07/21/2022     2. Snores  - Ambulatory referral to Pulmonology  3. Witnessed episode of apnea  - Ambulatory referral to Pulmonology  4. Erectile dysfunction, unspecified erectile dysfunction type  - sildenafil (VIAGRA) 25 MG tablet; Take 1 tablet (25 mg total) by mouth daily as needed for erectile dysfunction.  Dispense: 10 tablet; Refill: 0   Follow up:  Follow up in 3 months

## 2022-07-24 ENCOUNTER — Other Ambulatory Visit: Payer: Self-pay | Admitting: Nurse Practitioner

## 2022-07-24 DIAGNOSIS — N289 Disorder of kidney and ureter, unspecified: Secondary | ICD-10-CM

## 2022-07-24 LAB — MICROALBUMIN / CREATININE URINE RATIO
Creatinine, Urine: 27.7 mg/dL
Microalb/Creat Ratio: 1906 mg/g creat — ABNORMAL HIGH (ref 0–29)
Microalbumin, Urine: 528 ug/mL

## 2022-07-31 ENCOUNTER — Institutional Professional Consult (permissible substitution): Payer: Commercial Managed Care - HMO | Admitting: Primary Care

## 2022-08-01 ENCOUNTER — Ambulatory Visit: Payer: Commercial Managed Care - HMO | Admitting: Podiatry

## 2022-08-01 ENCOUNTER — Encounter: Payer: Self-pay | Admitting: Podiatry

## 2022-08-01 DIAGNOSIS — M79674 Pain in right toe(s): Secondary | ICD-10-CM

## 2022-08-01 DIAGNOSIS — E1122 Type 2 diabetes mellitus with diabetic chronic kidney disease: Secondary | ICD-10-CM | POA: Diagnosis not present

## 2022-08-01 DIAGNOSIS — M79675 Pain in left toe(s): Secondary | ICD-10-CM | POA: Diagnosis not present

## 2022-08-01 DIAGNOSIS — B351 Tinea unguium: Secondary | ICD-10-CM

## 2022-08-01 DIAGNOSIS — N184 Chronic kidney disease, stage 4 (severe): Secondary | ICD-10-CM | POA: Diagnosis not present

## 2022-08-01 NOTE — Progress Notes (Signed)
  Subjective:  Patient ID: Kevin Todd, male    DOB: Feb 11, 1976,   MRN: [id number]  Chief Complaint  Patient presents with   Diabetes    Rm 5 Establish care for yearly diabetic foot exam. Pt states he has no concerns. PCP referred pt to office. Light foul smell between toes. Nail thicknes with discoloration. Last A1c 13.1. No numbness, tingling or edema reported.     47 y.o. male presents for concern of thickened elongated and painful nails that are difficult to trim. Requesting to have them trimmed today. Denies burning and tingling in their feet. Patient is diabetic and last A1c was  Lab Results  Component Value Date   HGBA1C 5.8 (A) 07/21/2022   .   PCP:  [name], NP     PCP:  [name], NP    . Denies any other pedal complaints. Denies n/v/f/c.   Past Medical History:  Diagnosis Date   Chronic kidney disease    Elevated creatine kinase 09/2019   Hypertension    Iliac crest spur of left hip    Insomnia 10/2019   Pneumothorax on right    Polycystic kidney disease    Vitamin D deficiency 09/2019    Objective:  Physical Exam: Vascular: DP/PT pulses 2/4 bilateral. CFT <3 seconds. Absent hair growth on digits. Edema noted to bilateral lower extremities. Xerosis noted bilaterally.  Skin. No lacerations or abrasions bilateral feet. Nails 1-5 bilateral  are thickened discolored and elongated with subungual debris.  Musculoskeletal: MMT 5/5 bilateral lower extremities in DF, PF, Inversion and Eversion. Deceased ROM in DF of ankle joint.  Neurological: Sensation intact to light touch. Protective sensation diminished bilateral.    Assessment:   1. Pain due to onychomycosis of toenails of both feet   2. Type 2 diabetes mellitus with stage 4 chronic kidney disease, without long-term current use of insulin ([place])      Plan:  Patient was evaluated and treated and all questions answered. -Discussed and educated patient on diabetic foot care, especially with   regards to the vascular, neurological and musculoskeletal systems.  -Stressed the importance of good glycemic control and the detriment of not  controlling glucose levels in relation to the foot. -Discussed supportive shoes at all times and checking feet regularly.  -Mechanically debrided all nails 1-5 bilateral using sterile nail nipper and filed with dremel without incident  -Answered all patient questions -Patient to return  in 3 months for at risk foot care -Patient advised to call the office if any problems or questions arise in the meantime.   Lorenda Peck, DPM

## 2022-08-11 ENCOUNTER — Ambulatory Visit (INDEPENDENT_AMBULATORY_CARE_PROVIDER_SITE_OTHER): Payer: Commercial Managed Care - HMO | Admitting: Pulmonary Disease

## 2022-08-11 ENCOUNTER — Encounter: Payer: Self-pay | Admitting: Pulmonary Disease

## 2022-08-11 VITALS — BP 132/84 | HR 78 | Ht 68.0 in | Wt 301.0 lb

## 2022-08-11 DIAGNOSIS — G4719 Other hypersomnia: Secondary | ICD-10-CM | POA: Diagnosis not present

## 2022-08-11 DIAGNOSIS — R0683 Snoring: Secondary | ICD-10-CM | POA: Diagnosis not present

## 2022-08-11 NOTE — Patient Instructions (Addendum)
We will schedule you for home sleep study  We will update you with results as soon as reviewed  Weight loss efforts as able  Call us with significant concerns  Follow-up in 2 to 3 months  Living With Sleep Apnea Sleep apnea is a condition in which breathing pauses or becomes shallow during sleep. Sleep apnea is most commonly caused by a collapsed or blocked airway. People with sleep apnea usually snore loudly. They may have times when they gasp and stop breathing for 10 seconds or more during sleep. This may happen many times during the night. The breaks in breathing also interrupt the deep sleep that you need to feel rested. Even if you do not completely wake up from the gaps in breathing, your sleep may not be restful and you feel tired during the day. You may also have a headache in the morning and low energy during the day, and you may feel anxious or depressed. How can sleep apnea affect me? Sleep apnea increases your chances of extreme tiredness during the day (daytime fatigue). It can also increase your risk for health conditions, such as: Heart attack. Stroke. Obesity. Type 2 diabetes. Heart failure. Irregular heartbeat. High blood pressure. If you have daytime fatigue as a result of sleep apnea, you may be more likely to: Perform poorly at school or work. Fall asleep while driving. Have difficulty with attention. Develop depression or anxiety. Have sexual dysfunction. What actions can I take to manage sleep apnea? Sleep apnea treatment  If you were given a device to open your airway while you sleep, use it only as told by your health care provider. You may be given: An oral appliance. This is a custom-made mouthpiece that shifts your lower jaw forward. A continuous positive airway pressure (CPAP) device. This device blows air through a mask when you breathe out (exhale). A nasal expiratory positive airway pressure (EPAP) device. This device has valves that you put into each  nostril. A bi-level positive airway pressure (BIPAP) device. This device blows air through a mask when you breathe in (inhale) and breathe out (exhale). You may need surgery if other treatments do not work for you. Sleep habits Go to sleep and wake up at the same time every day. This helps set your internal clock (circadian rhythm) for sleeping. If you stay up later than usual, such as on weekends, try to get up in the morning within 2 hours of your normal wake time. Try to get at least 7-9 hours of sleep each night. Stop using a computer, tablet, and mobile phone a few hours before bedtime. Do not take long naps during the day. If you nap, limit it to 30 minutes. Have a relaxing bedtime routine. Reading or listening to music may relax you and help you sleep. Use your bedroom only for sleep. Keep your television and computer out of your bedroom. Keep your bedroom cool, dark, and quiet. Use a supportive mattress and pillows. Follow your health care provider's instructions for other changes to sleep habits. Nutrition Do not eat heavy meals in the evening. Do not have caffeine in the later part of the day. The effects of caffeine can last for more than 5 hours. Follow your health care provider's or dietitian's instructions for any diet changes. Lifestyle     Do not drink alcohol before bedtime. Alcohol can cause you to fall asleep at first, but then it can cause you to wake up in the middle of the night and have  trouble getting back to sleep. Do not use any products that contain nicotine or tobacco. These products include cigarettes, chewing tobacco, and vaping devices, such as e-cigarettes. If you need help quitting, ask your health care provider. Medicines Take over-the-counter and prescription medicines only as told by your health care provider. Do not use over-the-counter sleep medicine. You can become dependent on this medicine, and it can make sleep apnea worse. Do not use medicines,  such as sedatives and narcotics, unless told by your health care provider. Activity Exercise on most days, but avoid exercising in the evening. Exercising near bedtime can interfere with sleeping. If possible, spend time outside every day. Natural light helps regulate your circadian rhythm. General information Lose weight if you need to, and maintain a healthy weight. Keep all follow-up visits. This is important. If you are having surgery, make sure to tell your health care provider that you have sleep apnea. You may need to bring your device with you. Where to find more information Learn more about sleep apnea and daytime fatigue from: American Sleep Association: sleepassociation.Bibb: sleepfoundation.org National Heart, Lung, and Blood Institute: https://www.hartman-hill.biz/ Summary Sleep apnea is a condition in which breathing pauses or becomes shallow during sleep. Sleep apnea can cause daytime fatigue and other serious health conditions. You may need to wear a device while sleeping to help keep your airway open. If you are having surgery, make sure to tell your health care provider that you have sleep apnea. You may need to bring your device with you. Making changes to sleep habits, diet, lifestyle, and activity can help you manage sleep apnea. This information is not intended to replace advice given to you by your health care provider. Make sure you discuss any questions you have with your health care provider. Document Revised: 12/08/2020 Document Reviewed: 04/09/2020 Elsevier Patient Education  [place].

## 2022-08-11 NOTE — Progress Notes (Signed)
Kevin Todd    [id number]    October 17, 1975  Primary Care Physician:Nichols, Kriste Basque, NP  Referring Physician: [name], NP [facility] [address] 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   Continuous Blood Gluc Sensor (FREESTYLE LIBRE 3 SENSOR) MISC Place 1 sensor on the skin every 14 days. Use to check glucose continuously   furosemide (LASIX) 40 MG tablet Take 40 mg by mouth daily.   gabapentin (NEURONTIN) 100 MG capsule TAKE 1 CAPSULE (100 MG TOTAL) BY MOUTH THREE TIMES DAILY.   glucose blood test strip Use as instructed to check glucose   hydrALAZINE (APRESOLINE) 25 MG tablet Take 100 mg by mouth in the morning and at bedtime.   Insulin Glargine (BASAGLAR KWIKPEN) 100 UNIT/ML INJECT 40 UNITS INTO THE SKIN DAILY   JYNARQUE 90 & 30 MG TBPK Take 1 tablet by mouth in the morning and at bedtime.   metoprolol succinate (TOPROL-XL) 50 MG 24 hr tablet Take 50 mg by mouth daily.   rosuvastatin (CRESTOR) 10 MG tablet Take 1 tablet (10 mg total) by mouth daily.   sildenafil (VIAGRA) 25 MG tablet Take 1 tablet (25 mg  total) by mouth daily as needed for erectile dysfunction.   sodium bicarbonate 650 MG tablet Take 650 mg by mouth 2 (two) times daily.   tiZANidine (ZANAFLEX) 4 MG tablet Take 1 tablet (4 mg total) by mouth at bedtime.   TRULICITY 1.5 0000000 SOPN Inject 1.5 mg into the skin once a week.   No facility-administered encounter medications on file as of 08/11/2022.    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 04/14/2021   Procedure: Achilles tendon debridement and reconstruction, excision of Haglund;  Surgeon: Wylene Simmer, MD;  Location: [facility];  Service: Orthopedics;  Laterality: Right;   GASTROCNEMIUS RECESSION Right 04/14/2021   Procedure: Right gastroc recession;  Surgeon: Wylene Simmer, MD;  Location: [facility];  Service: Orthopedics;  Laterality: Right;    Family History  Problem Relation  Age of Onset   Hypertension Mother    Colon cancer Maternal Grandfather    Stomach cancer Neg Hx    Esophageal cancer Neg Hx    Pancreatic cancer Neg Hx     Social History   Socioeconomic History   Marital status: Single    Spouse name: Not on file   Number of children: Not on file   Years of education: Not on file   Highest education level: Not on file  Occupational History   Not on file  Tobacco Use   Smoking status: Former    Packs/day: .25    Types: Cigarettes   Smokeless tobacco: Never   Tobacco comments:    4 cigarettes/pd recently quit 03/2021  Vaping Use   Vaping Use: Never used  Substance and Sexual  Activity   Alcohol use: Not Currently   Drug use: No   Sexual activity: Yes  Other Topics Concern   Not on file  Social History Narrative   Not on file   Social Determinants of Health   Financial Resource Strain: Not on file  Food Insecurity: Not on file  Transportation Needs: Not on file  Physical Activity: Not on file  Stress: Not on file  Social Connections: Not on file  Intimate Partner Violence: Not on file    Review of Systems  Psychiatric/Behavioral:  Positive for sleep disturbance.     Vitals:   08/11/22 1329  BP: 132/84  Pulse: 78  SpO2: 98%     Physical Exam Constitutional:      Appearance: He is obese.  HENT:     Head: Normocephalic.     Nose: Nose normal.     Mouth/Throat:     Mouth: Mucous membranes are moist.     Comments: Mallampati 3, crowded oropharynx Eyes:     General: No scleral icterus.    Pupils: Pupils are equal, round, and reactive to light.  Cardiovascular:     Rate and Rhythm: Normal rate and regular rhythm.     Pulses: Normal pulses.     Heart sounds: Normal heart sounds.  Pulmonary:     Effort: No respiratory distress.     Breath sounds: No stridor. No wheezing or rhonchi.  Musculoskeletal:     Cervical back: No rigidity or tenderness.  Neurological:     Mental Status: He is alert.  Psychiatric:        Mood and Affect: Mood normal.       08/11/2022    1:00 PM  Results of the Epworth flowsheet  Sitting and reading 1  Watching TV 2  Sitting, inactive in a public place (e.g. a theatre or a meeting) 1  As a passenger in a car for an hour without a break 3  Lying down to rest in the afternoon when circumstances permit 3  Sitting and talking to someone 0  Sitting quietly after a lunch without alcohol 2  In a car, while stopped for a few minutes in traffic 0  Total score 12     Data Reviewed: No sleep study on record  Assessment:  Concern for significant obstructive sleep apnea  Excessive daytime sleepiness  Type 2  diabetes  Chronic kidney disease  Class III obesity  Pathophysiology of sleep disordered breathing discussed with patient Treatment options discussed with the patient  Plan/Recommendations: Will schedule patient for home sleep study  Will be updated with findings as soon as reviewed  Encouraged weight loss efforts  Follow-up in about 2  to 3 months  Encouraged to call with significant concerns   Sherrilyn Rist MD [facility] 08/11/2022, 2:02 PM  CC: [name], NP

## 2022-08-28 ENCOUNTER — Other Ambulatory Visit: Payer: Self-pay

## 2022-08-28 ENCOUNTER — Other Ambulatory Visit: Payer: Commercial Managed Care - HMO | Admitting: Pharmacist

## 2022-08-28 DIAGNOSIS — E1122 Type 2 diabetes mellitus with diabetic chronic kidney disease: Secondary | ICD-10-CM

## 2022-08-28 MED ORDER — TRULICITY 1.5 MG/0.5ML ~~LOC~~ SOAJ
1.5000 mg | SUBCUTANEOUS | 2 refills | Status: DC
  Filled 2022-08-28: qty 2, 28d supply, fill #0
  Filled 2022-10-17: qty 2, 28d supply, fill #1
  Filled 2022-11-10: qty 2, 28d supply, fill #2

## 2022-08-28 NOTE — Patient Instructions (Signed)
Check your blood pressure twice weekly, and any time you have concerning symptoms like headache, chest pain, dizziness, shortness of breath, or vision changes.   Our goal is less than 130/80.  To appropriately check your blood pressure, make sure you do the following:  1) Avoid caffeine, exercise, or tobacco products for 30 minutes before checking. Empty your bladder. 2) Sit with your back supported in a flat-backed chair. Rest your arm on something flat (arm of the chair, table, etc). 3) Sit still with your feet flat on the floor, resting, for at least 5 minutes.  4) Check your blood pressure. Take 1-2 readings.  5) Write down these readings and bring with you to any provider appointments.  Bring your home blood pressure machine with you to a provider's office for accuracy comparison at least once a year.   Make sure you take your blood pressure medications before you come to any office visit, even if you were asked to fast for labs.   Take care!  Catie T. Dory Demont, PharmD, BCACP, CPP [facility] [phone]  

## 2022-08-28 NOTE — Progress Notes (Signed)
08/28/2022 Name: Kevin Todd MRN: [id number] DOB: Mar 04, 1976  Chief Complaint  Patient presents with   Medication Management   Hypertension   Diabetes   Hyperlipidemia    Kevin Todd is a 47 y.o. year old male who presented for a telephone visit.   They were referred to the pharmacist by their PCP for assistance in managing diabetes, hypertension, and hyperlipidemia.    Subjective:  Care Team: Primary Care Provider: Ivonne Andrew, NP ; Next Scheduled Visit: 10/27/22  Medication Access/Adherence  Current Pharmacy:  [id number] [place], [place] - [address] AT [facility] [address] [place] [place] [address] Phone: [phone] Fax: [phone]  [facility] [address], [address] [place] [address] Phone: [phone] Fax: [phone]   Patient reports affordability concerns with their medications: No  Patient reports access/transportation concerns to their pharmacy: Yes  Patient reports adherence concerns with their medications:  No    Reports his pharmacy has been unable to get in Trulicity   Diabetes:  Current medications: Trulicity 1.5 mg weekly Medications tried in the past: no history of SGLT2  Patient denies hypoglycemic s/sx including dizziness, shakiness, sweating.   Hypertension:  Current medications: hydralazine 100 mg twice daily, amlodipine 10 mg daily, metoprolol succinate 50 mg daily   Patient has a validated, automated, upper arm home BP cuff Current blood pressure readings readings: 130s/80s  Patient denies hypotensive s/sx including dizziness, lightheadedness.  Patient denies hypertensive symptoms including headache, chest pain, shortness of breath   Hyperlipidemia/ASCVD Risk Reduction  Current lipid lowering medications: rosuvastatin 10 mg daily  Objective:  Lab Results  Component Value Date   HGBA1C 5.8 (A) 07/21/2022    Lab Results   Component Value Date   CREATININE 3.75 (H) 04/21/2022   BUN 29 (H) 04/21/2022   NA 142 04/21/2022   K 4.2 04/21/2022   CL 106 04/21/2022   CO2 19 (L) 04/21/2022    Lab Results  Component Value Date   CHOL 208 (H) 04/21/2022   HDL 33 (L) 04/21/2022   LDLCALC 138 (H) 04/21/2022   TRIG 203 (H) 04/21/2022   CHOLHDL 6.3 (H) 04/21/2022    Medications Reviewed Today     Reviewed by Alden Hipp, RPH-CPP (Pharmacist) on 08/28/22 at 1448  Med List Status: <None>   Medication Order Taking? Sig Documenting Provider Last Dose Status Informant  amLODipine (NORVASC) 10 MG tablet [id number] Yes Take 1 tablet (10 mg total) by mouth daily. Marguerita Merles [place], DO Taking Active Self  Continuous Blood Gluc Sensor (FREESTYLE LIBRE 3 SENSOR) [place] [id number] Yes Place 1 sensor on the skin every 14 days. Use to check glucose continuously Ivonne Andrew, NP Taking Active   furosemide (LASIX) 40 MG tablet [id number] Yes Take 40 mg by mouth daily. [provider] Taking Active   gabapentin (NEURONTIN) 100 MG capsule [id number] Yes TAKE 1 CAPSULE (100 MG TOTAL) BY MOUTH THREE TIMES DAILY. Ivonne Andrew, NP Taking Active   glucose blood test strip [id number]  Use as instructed to check glucose Ivonne Andrew, NP  Active   hydrALAZINE (APRESOLINE) 100 MG tablet [id number] Yes Take 100 mg by mouth 2 (two) times daily. [provider] Taking Active   JYNARQUE 90 & 30 MG TBPK [id number] Yes Take 1 tablet by mouth in the morning and at bedtime. [provider] Taking Active Self  metoprolol succinate (TOPROL-XL) 50 MG 24 hr tablet  [id number] Yes Take 50 mg by mouth daily. [provider] Taking Active Self  rosuvastatin (CRESTOR) 10 MG tablet [id number] Yes Take 1 tablet (10 mg total) by mouth daily. Ivonne Andrew, NP Taking Active   sildenafil (VIAGRA) 25 MG tablet [id number] Yes Take 1 tablet (25 mg total) by mouth daily as needed for erectile dysfunction. Ivonne Andrew, NP Taking Active   sodium bicarbonate 650 MG tablet [id number] Yes Take 650 mg by mouth 2 (two) times daily. [provider] Taking Active Self  tiZANidine (ZANAFLEX) 4 MG tablet [id number] Yes Take 1 tablet (4 mg total) by mouth at bedtime. Ivonne Andrew, NP Taking Active   TRULICITY 1.5 MG/0.5ML Namon Cirri [id number] Yes Inject 1.5 mg into the skin once a week. Ivonne Andrew, NP Taking Active               Assessment/Plan:   Diabetes: - Currently controlled but CKD likely impacting A1c accuracy.  - Will collaborate with PCP to send Trulicity script to one of our [employer] who do have Trulicity 1.5 mg in stock.  - Consider SGLT2 given elevated UACR. Contacted Dr. Jon Gills office, left message. Patient's eGFR has fluctuated around 20-25 on recent checks.  - Recommend to continue Trulicity at this time  Hypertension: - Currently moderately well controlled - Reviewed long term cardiovascular and renal outcomes of uncontrolled blood pressure - Reviewed appropriate blood pressure monitoring technique and reviewed goal blood pressure. Recommended to check home blood pressure and heart rate daily, document, and provide to team at follow up - Recommend to continue current regimen at this time   Hyperlipidemia/ASCVD Risk Reduction: - Currently uncontrolled but anticipate improvement.  - Recommend to recheck lipids at next visit  Follow Up Plan: follow up with PCP.   Catie Eppie Gibson, PharmD, BCACP, CPP [facility] [phone]

## 2022-08-29 ENCOUNTER — Other Ambulatory Visit: Payer: Self-pay

## 2022-09-01 ENCOUNTER — Telehealth: Payer: Self-pay | Admitting: Pulmonary Disease

## 2022-09-01 ENCOUNTER — Other Ambulatory Visit: Payer: Self-pay

## 2022-09-01 DIAGNOSIS — R0683 Snoring: Secondary | ICD-10-CM

## 2022-09-01 NOTE — Telephone Encounter (Signed)
PT was supposed to be sched for a HST and has not heard. No active order in place. Pls call to advise @ [phone]

## 2022-09-01 NOTE — Telephone Encounter (Signed)
Spoke with patient. Advised I re-submitted HST stat since order was missed a last apt. Advised pt to call back in a week or so if no one has contacted him. He verbalized understanding. NFN

## 2022-09-11 ENCOUNTER — Other Ambulatory Visit (HOSPITAL_COMMUNITY): Payer: Self-pay

## 2022-09-27 ENCOUNTER — Other Ambulatory Visit: Payer: Self-pay | Admitting: Nurse Practitioner

## 2022-10-17 ENCOUNTER — Other Ambulatory Visit: Payer: Self-pay

## 2022-10-17 ENCOUNTER — Other Ambulatory Visit: Payer: Self-pay | Admitting: Pharmacist

## 2022-10-17 NOTE — Progress Notes (Signed)
Pharmacy Quality Measure Review  This patient is appearing on a report for being at risk of failing the adherence measure for diabetes medications this calendar year.   Medication: Trulicity 1.5 mg Last fill date: 08/29/22 for 1 month supply MAD PDC: 44%  Contacted Pharmacy at [facility]. They will fill Trulicity for him.   Catie Eppie Gibson, PharmD, BCACP, CPP [facility] [phone]

## 2022-10-18 ENCOUNTER — Ambulatory Visit: Payer: Commercial Managed Care - HMO | Admitting: Pulmonary Disease

## 2022-10-27 ENCOUNTER — Ambulatory Visit (INDEPENDENT_AMBULATORY_CARE_PROVIDER_SITE_OTHER): Payer: Medicaid Other | Admitting: Nurse Practitioner

## 2022-10-27 ENCOUNTER — Encounter: Payer: Self-pay | Admitting: Nurse Practitioner

## 2022-10-27 VITALS — BP 138/82 | HR 79 | Temp 97.7°F | Wt 301.0 lb

## 2022-10-27 DIAGNOSIS — N529 Male erectile dysfunction, unspecified: Secondary | ICD-10-CM

## 2022-10-27 DIAGNOSIS — E1122 Type 2 diabetes mellitus with diabetic chronic kidney disease: Secondary | ICD-10-CM | POA: Diagnosis not present

## 2022-10-27 DIAGNOSIS — N184 Chronic kidney disease, stage 4 (severe): Secondary | ICD-10-CM

## 2022-10-27 LAB — POCT GLYCOSYLATED HEMOGLOBIN (HGB A1C): Hemoglobin A1C: 6 % — AB (ref 4.0–5.6)

## 2022-10-27 MED ORDER — SILDENAFIL CITRATE 100 MG PO TABS: 50.0000 mg | ORAL_TABLET | Freq: Every day | ORAL | 11 refills | Status: DC | PRN

## 2022-10-27 NOTE — Patient Instructions (Signed)
1. Type 2 diabetes mellitus with stage 4 chronic kidney disease, without long-term current use of insulin ([facility])  - POCT glycosylated hemoglobin (Hb A1C) -continue diabetic diet  2. Erectile dysfunction, unspecified erectile dysfunction type  - sildenafil (VIAGRA) 100 MG tablet; Take 0.5-1 tablets (50-100 mg total) by mouth daily as needed for erectile dysfunction.  Dispense: 5 tablet; Refill: 11  Follow up:  Follow up in 3 months

## 2022-10-27 NOTE — Progress Notes (Signed)
@Patient  ID: Kevin Todd, male    DOB: 17-Mar-1976, 47 y.o.   MRN: [id number]  Chief Complaint  Patient presents with   Diabetes    Follow u6p    Referring provider: Ivonne Andrew, NP   HPI  47 year old male with history of hypertension, type 2 diabetes, CKD, tobacco abuse, and obesity.    Patient presents today for diabetes follow-up.  He has been using Trulicity.  His A1c has dramatically improved and is at 6.0 now.  He has been followed by pharmacy.  Patient is requesting increased dose of viagra.  Patient did have full lab workup through nephrology a couple weeks ago.  We will try to get these lab results sent to our office.  Denies f/c/s, n/v/d, hemoptysis, PND, leg swelling Denies chest pain or edema        Allergies  Allergen Reactions   Nsaids     Kidney Disease     There is no immunization history on file for this patient.  Past Medical History:  Diagnosis Date   Chronic kidney disease    Elevated creatine kinase 09/2019   Hypertension    Iliac crest spur of left hip    Insomnia 10/2019   Pneumothorax on right    Polycystic kidney disease    Vitamin D deficiency 09/2019    Tobacco History: Social History   Tobacco Use  Smoking Status Former   Packs/day: .25   Types: Cigarettes  Smokeless Tobacco Never  Tobacco Comments   4 cigarettes/pd recently quit 03/2021   Counseling given: Not Answered Tobacco comments: 4 cigarettes/pd recently quit 03/2021   Outpatient Encounter Medications as of 10/27/2022  Medication Sig   amLODipine (NORVASC) 10 MG tablet Take 1 tablet (10 mg total) by mouth daily.   Continuous Blood Gluc Sensor (FREESTYLE LIBRE 3 SENSOR) MISC Place 1 sensor on the skin every 14 days. Use to check glucose continuously   Dulaglutide (TRULICITY) 1.5 MG/0.5ML SOPN Inject 1.5 mg into the skin once a week.   furosemide (LASIX) 40 MG tablet Take 40 mg by mouth daily.   gabapentin (NEURONTIN) 100 MG capsule TAKE 1 CAPSULE (100 MG  TOTAL) BY MOUTH THREE TIMES DAILY.   glucose blood (ACCU-CHEK GUIDE) test strip Use as directed to test blood sugars BID   hydrALAZINE (APRESOLINE) 100 MG tablet Take 100 mg by mouth 2 (two) times daily.   JYNARQUE 90 & 30 MG TBPK Take 1 tablet by mouth in the morning and at bedtime.   metoprolol succinate (TOPROL-XL) 50 MG 24 hr tablet Take 50 mg by mouth daily.   rosuvastatin (CRESTOR) 10 MG tablet Take 1 tablet (10 mg total) by mouth daily.   sildenafil (VIAGRA) 100 MG tablet Take 0.5-1 tablets (50-100 mg total) by mouth daily as needed for erectile dysfunction.   sodium bicarbonate 650 MG tablet Take 650 mg by mouth 2 (two) times daily.   tiZANidine (ZANAFLEX) 4 MG tablet Take 1 tablet (4 mg total) by mouth at bedtime.   [DISCONTINUED] sildenafil (VIAGRA) 25 MG tablet Take 1 tablet (25 mg total) by mouth daily as needed for erectile dysfunction.   No facility-administered encounter medications on file as of 10/27/2022.     Review of Systems  Review of Systems  Constitutional: Negative.   HENT: Negative.    Cardiovascular: Negative.   Gastrointestinal: Negative.   Allergic/Immunologic: Negative.   Neurological: Negative.   Psychiatric/Behavioral: Negative.         Physical Exam  BP 138/82   Pulse 79   Temp 97.7 F (36.5 C)   Wt (!) 301 lb (136.5 kg)   SpO2 100%   BMI 45.77 kg/m   Wt Readings from Last 5 Encounters:  10/27/22 (!) 301 lb (136.5 kg)  08/11/22 (!) 301 lb (136.5 kg)  07/21/22 297 lb (134.7 kg)  04/21/22 (!) 306 lb (138.8 kg)  01/11/22 (!) 304 lb 8 oz (138.1 kg)     Physical Exam Vitals and nursing note reviewed.  Constitutional:      General: He is not in acute distress.    Appearance: He is well-developed.  Cardiovascular:     Rate and Rhythm: Normal rate and regular rhythm.  Pulmonary:     Effort: Pulmonary effort is normal.     Breath sounds: Normal breath sounds.  Skin:    General: Skin is warm and dry.  Neurological:     Mental Status:  He is alert and oriented to person, place, and time.      Lab Results:  CBC    Component Value Date/Time   WBC 7.0 04/21/2022 0959   WBC 6.9 12/11/2021 0801   RBC 3.89 (L) 04/21/2022 0959   RBC 3.69 (L) 12/11/2021 0801   HGB 11.7 (L) 04/21/2022 0959   HCT 35.3 (L) 04/21/2022 0959   PLT 284 04/21/2022 0959   MCV 91 04/21/2022 0959   MCH 30.1 04/21/2022 0959   MCH 31.7 12/11/2021 0801   MCHC 33.1 04/21/2022 0959   MCHC 34.6 12/11/2021 0801   RDW 14.6 04/21/2022 0959   LYMPHSABS 1.3 12/11/2021 0801   LYMPHSABS 1.6 09/29/2019 1402   MONOABS 0.4 12/11/2021 0801   EOSABS 0.1 12/11/2021 0801   EOSABS 0.2 09/29/2019 1402   BASOSABS 0.1 12/11/2021 0801   BASOSABS 0.1 09/29/2019 1402    BMET    Component Value Date/Time   NA 142 04/21/2022 0959   K 4.2 04/21/2022 0959   CL 106 04/21/2022 0959   CO2 19 (L) 04/21/2022 0959   GLUCOSE 171 (H) 04/21/2022 0959   GLUCOSE 253 (H) 12/13/2021 0413   BUN 29 (H) 04/21/2022 0959   CREATININE 3.75 (H) 04/21/2022 0959   CALCIUM 9.0 04/21/2022 0959   GFRNONAA 24 (L) 12/13/2021 0413   GFRAA 29 (L) 09/29/2019 1402      Assessment & Plan:   Type 2 diabetes mellitus with stage 4 chronic kidney disease, without long-term current use of insulin (HCC) - POCT glycosylated hemoglobin (Hb A1C) -continue diabetic diet  2. Erectile dysfunction, unspecified erectile dysfunction type  - sildenafil (VIAGRA) 100 MG tablet; Take 0.5-1 tablets (50-100 mg total) by mouth daily as needed for erectile dysfunction.  Dispense: 5 tablet; Refill: 11  Follow up:  Follow up in 3 months     Ivonne Andrew, NP 10/27/2022

## 2022-10-27 NOTE — Assessment & Plan Note (Signed)
-   POCT glycosylated hemoglobin (Hb A1C) -continue diabetic diet  2. Erectile dysfunction, unspecified erectile dysfunction type  - sildenafil (VIAGRA) 100 MG tablet; Take 0.5-1 tablets (50-100 mg total) by mouth daily as needed for erectile dysfunction.  Dispense: 5 tablet; Refill: 11  Follow up:  Follow up in 3 months

## 2022-11-01 ENCOUNTER — Encounter: Payer: Self-pay | Admitting: Podiatry

## 2022-11-01 ENCOUNTER — Ambulatory Visit (INDEPENDENT_AMBULATORY_CARE_PROVIDER_SITE_OTHER): Payer: Medicaid Other | Admitting: Podiatry

## 2022-11-01 VITALS — BP 155/81

## 2022-11-01 DIAGNOSIS — M79675 Pain in left toe(s): Secondary | ICD-10-CM

## 2022-11-01 DIAGNOSIS — B351 Tinea unguium: Secondary | ICD-10-CM | POA: Diagnosis not present

## 2022-11-01 DIAGNOSIS — N184 Chronic kidney disease, stage 4 (severe): Secondary | ICD-10-CM

## 2022-11-01 DIAGNOSIS — M79674 Pain in right toe(s): Secondary | ICD-10-CM | POA: Diagnosis not present

## 2022-11-01 DIAGNOSIS — E1122 Type 2 diabetes mellitus with diabetic chronic kidney disease: Secondary | ICD-10-CM

## 2022-11-01 NOTE — Progress Notes (Signed)
This patient returns to my office for at risk foot care.  This patient requires this care by a professional since this patient will be at risk due to having diabetes with kidney disease.  This patient is unable to cut nails himself since the patient cannot reach his nails.These nails are painful walking and wearing shoes.  This patient presents for at risk foot care today.  General Appearance  Alert, conversant and in no acute stress.  Vascular  Dorsalis pedis and posterior tibial  pulses are palpable  bilaterally.  Capillary return is within normal limits  bilaterally. Temperature is within normal limits  bilaterally.  Neurologic  Senn-Weinstein monofilament wire test within normal limits  bilaterally. Muscle power within normal limits bilaterally.  Nails Thick disfigured discolored nails with subungual debris  from hallux to fifth toes bilaterally. No evidence of bacterial infection or drainage bilaterally.  Orthopedic  No limitations of motion  feet .  No crepitus or effusions noted.  No bony pathology or digital deformities noted.  Skin  normotropic skin with no porokeratosis noted bilaterally.  No signs of infections or ulcers noted.     Onychomycosis  Pain in right toes  Pain in left toes  Consent was obtained for treatment procedures.   Mechanical debridement of nails 1-5  bilaterally performed with a nail nipper.  Filed with dremel without incident.    Return office visit    3 months                  Told patient to return for periodic foot care and evaluation due to potential at risk complications.   Uri Turnbough DPM  

## 2022-11-13 ENCOUNTER — Telehealth: Payer: Self-pay | Admitting: Pulmonary Disease

## 2022-11-13 NOTE — Telephone Encounter (Signed)
Patient would like results for sleep study. Patient phone number is [phone].

## 2022-11-15 ENCOUNTER — Other Ambulatory Visit: Payer: Self-pay

## 2022-11-15 NOTE — Telephone Encounter (Signed)
Called and spoke with patient. He was calling for his sleep study results. Confirmed he completed the test via Snap. I advised him that as soon as we have the results from Dr. Wynona Neat, we would let him know. He verbalized understanding.   Dr. Wynona Neat, can you please advise on his sleep study results when you get a chance? Thanks!

## 2022-11-17 ENCOUNTER — Ambulatory Visit (INDEPENDENT_AMBULATORY_CARE_PROVIDER_SITE_OTHER): Payer: Medicaid Other

## 2022-11-17 DIAGNOSIS — R0683 Snoring: Secondary | ICD-10-CM

## 2022-11-17 DIAGNOSIS — G4733 Obstructive sleep apnea (adult) (pediatric): Secondary | ICD-10-CM | POA: Diagnosis not present

## 2022-11-17 NOTE — Telephone Encounter (Signed)
Call patient  Sleep study result  Date of study: 10/25/2022  Impression: Mild obstructive sleep apnea Mild oxygen desaturations  Recommendation: Options of treatment for mild obstructive sleep apnea will include  1.  CPAP therapy if there is significant daytime sleepiness or other comorbidities including history of CVA or cardiac disease  -If CPAP is chosen as an option of treatment auto titrating CPAP with a pressure setting of 5-15 will be appropriate  2.  Watchful waiting with emphasis on weight loss measures, sleep position modification to optimize lateral sleep, elevating the head of the bed by about 30 degrees may also help.  3.  An oral device may be fashioned for the treatment of mild sleep disordered breathing, will involve referral to dentist.   Follow-up as previously scheduled

## 2022-11-19 ENCOUNTER — Encounter (HOSPITAL_COMMUNITY): Payer: Self-pay | Admitting: *Deleted

## 2022-11-19 ENCOUNTER — Ambulatory Visit (HOSPITAL_COMMUNITY)
Admission: EM | Admit: 2022-11-19 | Discharge: 2022-11-19 | Disposition: A | Discharge status: Home / Self Care | Payer: Medicaid Other | Attending: Emergency Medicine | Admitting: Emergency Medicine

## 2022-11-19 DIAGNOSIS — M1712 Unilateral primary osteoarthritis, left knee: Secondary | ICD-10-CM

## 2022-11-19 DIAGNOSIS — M1612 Unilateral primary osteoarthritis, left hip: Secondary | ICD-10-CM | POA: Diagnosis not present

## 2022-11-19 MED ORDER — KETOROLAC TROMETHAMINE 30 MG/ML IJ SOLN
INTRAMUSCULAR | Status: AC
  Filled 2022-11-19: qty 1

## 2022-11-19 MED ORDER — METHYLPREDNISOLONE SODIUM SUCC 125 MG IJ SOLR
60.0000 mg | Freq: Once | INTRAMUSCULAR | Status: AC
  Administered 2022-11-19: 60 mg via INTRAMUSCULAR

## 2022-11-19 MED ORDER — METHYLPREDNISOLONE SODIUM SUCC 125 MG IJ SOLR
INTRAMUSCULAR | Status: AC
  Filled 2022-11-19: qty 2

## 2022-11-19 MED ORDER — KETOROLAC TROMETHAMINE 60 MG/2ML IM SOLN
15.0000 mg | Freq: Once | INTRAMUSCULAR | Status: AC
  Administered 2022-11-19: 15 mg via INTRAMUSCULAR

## 2022-11-19 NOTE — ED Triage Notes (Signed)
Pt states he usually sees [facility] for his left hip and knee pain. He states he had cortisone injections about a month ago. He is now having left hip and knee pain x 2 weeks. He is taking some oxy he has at home from an old rx which he states isnt helping the pain at all. He states he can't walk the pain is so bad.

## 2022-11-19 NOTE — ED Provider Notes (Signed)
[facility]    CSN: [id number] Arrival date & time: 11/19/22  1212      History   Chief Complaint Chief Complaint  Patient presents with   Hip Pain   Knee Pain    HPI Kevin Todd is a 47 y.o. male.   Patient presents to clinic for complaints of left hip and left knee pain.  He recently got a cortisone injection in his joints about a month ago from [place].  His left hip and left knee pain has been ongoing for the past 2 weeks.  He did take some leftover oxycodone at home which did not help with his pain.  Reports he cannot walk due to the pain severity.  Of note, he ambulated into the urgent care.  Also ambulated from his downstairs bedroom this morning.  Denies any recent falls or trauma.  He is diabetic, last A1c was 5.9.  Known history of bilateral hip osteoarthritis.      The history is provided by the patient, medical records and the spouse.  Hip Pain  Knee Pain   Past Medical History:  Diagnosis Date   Chronic kidney disease    Elevated creatine kinase 09/2019   Hypertension    Iliac crest spur of left hip    Insomnia 10/2019   Pneumothorax on right    Polycystic kidney disease    Vitamin D deficiency 09/2019    Patient Active Problem List   Diagnosis Date Noted   Type 2 diabetes mellitus with stage 4 chronic kidney disease, without long-term current use of insulin (HCC) 12/21/2021   Hyponatremia 12/10/2021   Hypokalemia 12/10/2021   DKA (diabetic ketoacidosis) (HCC) 12/09/2021   Acquired short Achilles tendon of right lower extremity 03/09/2021   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   Chronic hip pain 11/03/2019   AKI (acute kidney injury) (HCC)    LLQ abdominal pain    Rectal bleeding    ARF (acute renal failure) (HCC) 09/17/2019   Hypertensive urgency 09/17/2019   Gross hematuria 09/17/2019   Acute pyelonephritis 09/17/2019   Complicated UTI (urinary tract infection) 09/17/2019   Polycystic kidney disease 09/17/2019    Essential hypertension    Chronic kidney disease    Chest pain 02/28/2016   Acute kidney injury (HCC) 02/28/2016   Morbid obesity (HCC) 02/28/2016   Tobacco abuse 02/28/2016   Hypertensive emergency 02/28/2016   Spontaneous pneumothorax 01/24/2013    Past Surgical History:  Procedure Laterality Date   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 Right 04/14/2021   Procedure: Achilles tendon debridement and reconstruction, excision of Haglund;  Surgeon: Toni Arthurs, MD;  Location: [facility];  Service: Orthopedics;  Laterality: Right;   GASTROCNEMIUS RECESSION Right 04/14/2021   Procedure: Right gastroc recession;  Surgeon: Toni Arthurs, MD;  Location: [facility];  Service: Orthopedics;  Laterality: Right;       Home Medications    Prior to Admission medications   Medication Sig Start Date End Date Taking? Authorizing Provider  amLODipine (NORVASC) 10 MG tablet Take 1 tablet (10 mg total) by mouth daily. 09/19/19  Yes Sheikh, Omair Latif, DO  Continuous Blood Gluc Sensor (FREESTYLE LIBRE 3 SENSOR) MISC Place 1 sensor on the skin every 14 days. Use to check glucose continuously 12/21/21  Yes Ivonne Andrew, NP  Dulaglutide (TRULICITY) 1.5 MG/0.5ML SOPN Inject 1.5 mg into the skin once a week. 08/28/22  Yes Ivonne Andrew, NP  furosemide (LASIX) 40 MG tablet Take 40  mg by mouth daily. 02/12/22  Yes [provider]  gabapentin (NEURONTIN) 100 MG capsule TAKE 1 CAPSULE (100 MG TOTAL) BY MOUTH THREE TIMES DAILY. 06/07/22  Yes Ivonne Andrew, NP  glucose blood (ACCU-CHEK GUIDE) test strip Use as directed to test blood sugars BID 09/28/22  Yes Ivonne Andrew, NP  hydrALAZINE (APRESOLINE) 100 MG tablet Take 100 mg by mouth 2 (two) times daily. 07/09/22  Yes [provider]  JYNARQUE 90 & 30 MG TBPK Take 1 tablet by mouth in the morning and at bedtime. 12/02/21  Yes [provider]  metoprolol succinate (TOPROL-XL) 50 MG 24 hr  tablet Take 50 mg by mouth daily. 01/16/20  Yes [provider]  rosuvastatin (CRESTOR) 10 MG tablet Take 1 tablet (10 mg total) by mouth daily. 05/17/22  Yes Ivonne Andrew, NP  sodium bicarbonate 650 MG tablet Take 650 mg by mouth 2 (two) times daily. 10/31/21  Yes [provider]  tiZANidine (ZANAFLEX) 4 MG tablet Take 1 tablet (4 mg total) by mouth at bedtime. 01/26/22  Yes Ivonne Andrew, NP  sildenafil (VIAGRA) 100 MG tablet Take 0.5-1 tablets (50-100 mg total) by mouth daily as needed for erectile dysfunction. 10/27/22   Ivonne Andrew, NP    Family History Family History  Problem Relation Age of Onset   Hypertension Mother    Colon cancer Maternal Grandfather    Stomach cancer Neg Hx    Esophageal cancer Neg Hx    Pancreatic cancer Neg Hx     Social History Social History   Tobacco Use   Smoking status: Former    Packs/day: .25    Types: Cigarettes   Smokeless tobacco: Never   Tobacco comments:    4 cigarettes/pd recently quit 03/2021  Vaping Use   Vaping Use: Never used  Substance Use Topics   Alcohol use: Not Currently   Drug use: No     Allergies   Nsaids   Review of Systems Review of Systems  Musculoskeletal:  Positive for gait problem.     Physical Exam Triage Vital Signs ED Triage Vitals  Enc Vitals Group     BP 11/19/22 1250 (!) 160/92     Pulse Rate 11/19/22 1250 66     Resp 11/19/22 1250 18     Temp 11/19/22 1250 98.1 F (36.7 C)     Temp Source 11/19/22 1250 Oral     SpO2 11/19/22 1250 98 %     Weight --      Height --      Head Circumference --      Peak Flow --      Pain Score 11/19/22 1248 10     Pain Loc --      Pain Edu? --      Excl. in GC? --    No data found.  Updated Vital Signs BP (!) 160/92 (BP Location: Right Arm)   Pulse 66   Temp 98.1 F (36.7 C) (Oral)   Resp 18   SpO2 98%   Visual Acuity Right Eye Distance:   Left Eye Distance:   Bilateral Distance:    Right Eye Near:   Left Eye Near:     Bilateral Near:     Physical Exam Vitals and nursing note reviewed.  Constitutional:      Appearance: Normal appearance.  HENT:     Head: Normocephalic and atraumatic.     Right Ear: External ear normal.  Left Ear: External ear normal.     Nose: Nose normal.     Mouth/Throat:     Mouth: Mucous membranes are moist.  Eyes:     Conjunctiva/sclera: Conjunctivae normal.  Cardiovascular:     Rate and Rhythm: Normal rate.  Pulmonary:     Effort: Pulmonary effort is normal. No respiratory distress.  Musculoskeletal:     Cervical back: Normal range of motion.  Neurological:     General: No focal deficit present.     Mental Status: He is alert and oriented to person, place, and time.  Psychiatric:        Mood and Affect: Mood normal.      UC Treatments / Results  Labs (all labs ordered are listed, but only abnormal results are displayed) Labs Reviewed - No data to display  EKG   Radiology No results found.  Procedures Procedures (including critical care time)  Medications Ordered in UC Medications  ketorolac (TORADOL) injection 15 mg (has no administration in time range)  methylPREDNISolone sodium succinate (SOLU-MEDROL) 125 mg/2 mL injection 60 mg (has no administration in time range)    Initial Impression / Assessment and Plan / UC Course  I have reviewed the triage vital signs and the nursing notes.  Pertinent labs & imaging results that were available during my care of the patient were reviewed by me and considered in my medical decision making (see chart for details).  Vitals and triage reviewed, patient is hemodynamically stable.  Ambulatory in clinic, wheelchair in room.  Atraumatic.  Symptoms consistent with osteoarthritis flare, given very low dose of Toradol in clinic as well as a IM steroid one-time dose.  Encouraged to follow-up with [facility] for further evaluation and follow-up.  Discussed that narcotics are not indicated for chronic pain.  Plan of  care, follow-up care and return precautions given, no questions at this time.     Final Clinical Impressions(s) / UC Diagnoses   Final diagnoses:  Osteoarthritis of left hip, unspecified osteoarthritis type  Osteoarthritis of left knee, unspecified osteoarthritis type     Discharge Instructions      Your symptoms are consistent with osteoarthritis.  We have given you injections in clinic to help with your pain.  You can take 500 mg of Tylenol every 6-8 hours to help with your pain as well once you get home.  It is important that you follow-up with orthopedics for reevaluation.  Please return to clinic for any new or urgent symptoms.      ED Prescriptions   None    PDMP not reviewed this encounter.   Binnie Droessler, [place], [place] 11/19/22 [phone]

## 2022-11-19 NOTE — Discharge Instructions (Addendum)
Your symptoms are consistent with osteoarthritis.  We have given you injections in clinic to help with your pain.  You can take 500 mg of Tylenol every 6-8 hours to help with your pain as well once you get home.  It is important that you follow-up with orthopedics for reevaluation.  Please return to clinic for any new or urgent symptoms.

## 2022-11-23 NOTE — Telephone Encounter (Signed)
Spoke with patient regarding sleep study result's  Date of study: 10/25/2022   Impression: Mild obstructive sleep apnea Mild oxygen desaturations   Recommendation: Options of treatment for mild obstructive sleep apnea will include   1.  CPAP therapy if there is significant daytime sleepiness or other comorbidities including history of CVA or cardiac disease   -If CPAP is chosen as an option of treatment auto titrating CPAP with a pressure setting of 5-15 will be appropriate   2.  Watchful waiting with emphasis on weight loss measures, sleep position modification to optimize lateral sleep, elevating the head of the bed by about 30 degrees may also help.   3.  An oral device may be fashioned for the treatment of mild sleep disordered breathing, will involve referral to dentist.     Follow-up as previously scheduled  Patient was scheduled with Beth to go over sleep study on 11/28/22.  Patient's voice was understanding.Nothing else further needed.

## 2022-11-28 ENCOUNTER — Encounter: Payer: Self-pay | Admitting: Primary Care

## 2022-11-28 ENCOUNTER — Ambulatory Visit (INDEPENDENT_AMBULATORY_CARE_PROVIDER_SITE_OTHER): Payer: Medicaid Other | Admitting: Primary Care

## 2022-11-28 VITALS — BP 144/84 | HR 96 | Temp 98.4°F | Ht 68.0 in | Wt 297.0 lb

## 2022-11-28 DIAGNOSIS — G4730 Sleep apnea, unspecified: Secondary | ICD-10-CM

## 2022-11-28 NOTE — Progress Notes (Signed)
@Patient  ID: Kevin Todd, male    DOB: 1976/01/14, 47 y.o.   MRN: [id number]  Chief Complaint  Patient presents with   Follow-up    Review HST    Referring provider: Ivonne Andrew, NP  HPI 47 year old male, former smoker.  Past medical history significant HTN, spontaneous pneumothorax, type 2 diabetes, polycycstic kidney disease, obesity, tobacco abuse.  Previous [facility] pulmonary encounter:  History of snoring, history of witnessed apneas  Usually goes to bed between 11 and 12 Falls asleep easily 4-5 awakenings Final wake up time about 8 AM Does not feel rested when he wakes up in the morning Weight is up about 10 to 20 pounds  He works as a Administrator, [employer] about a pack of cigarettes a week  Admits to dryness of his mouth in the morning Occasional headaches No night sweats Memory is fair  Has difficulty sometimes driving long distances but has not had any problems with driving specifically  Parents do not snore as far as he can remember  No pets  History of hypertension, diabetes, history of kidney disease    11/28/2022 - Interim hx  Patient presents today to review sleep study results.  Patient was seen for sleep consult due to snoring symptoms and witnessed apnea.  His sleep is extremely restless, he wakes up 4-5 times on average at night.  He does take medication that makes him have to use the restroom at night due to his kidney disease. HST on 10/25/22 showed very mild OSA, AHI 5.9/hour with SpO2 88%. He spent 0 minutes with oxygen level <88%.  We discussed treatment options including weight loss, oral appliance, CPAP therapy or referral to ENT for possible surgical options.  Patient typically is a side sleeper.  He is working on weight loss.  He elected to be referred for oral appliance.  He feels that this would be a better option than CPAP as he moves around a lot in his sleep.    Allergies  Allergen Reactions   Nsaids     Kidney Disease     There is no  immunization history on file for this patient.  Past Medical History:  Diagnosis Date   Chronic kidney disease    Elevated creatine kinase 09/2019   Hypertension    Iliac crest spur of left hip    Insomnia 10/2019   Pneumothorax on right    Polycystic kidney disease    Vitamin D deficiency 09/2019    Tobacco History: Social History   Tobacco Use  Smoking Status Former   Average packs/day: 0.3 packs/day for 32.2 years (8.0 ttl pk-yrs)   Types: Cigarettes   Start date: 07/18/1990  Smokeless Tobacco Never  Tobacco Comments   4 cigarettes/pd recently quit 03/2021   Restarted smoking.  Quit smoking as of May 2024.  Updated 11/28/2022.  am   Counseling given: Not Answered Tobacco comments: 4 cigarettes/pd recently quit 03/2021 Restarted smoking.  Quit smoking as of May 2024.  Updated 11/28/2022.  am   Outpatient Medications Prior to Visit  Medication Sig Dispense Refill   amLODipine (NORVASC) 10 MG tablet Take 1 tablet (10 mg total) by mouth daily. 30 tablet 1   Continuous Blood Gluc Sensor (FREESTYLE LIBRE 3 SENSOR) MISC Place 1 sensor on the skin every 14 days. Use to check glucose continuously 2 each 11   Dulaglutide (TRULICITY) 1.5 MG/0.5ML SOPN Inject 1.5 mg into the skin once a week. 2 mL 2   furosemide (LASIX)  40 MG tablet Take 40 mg by mouth daily.     gabapentin (NEURONTIN) 300 MG capsule Take 300 mg by mouth 3 (three) times daily.     glucose blood (ACCU-CHEK GUIDE) test strip Use as directed to test blood sugars BID 200 strip 5   hydrALAZINE (APRESOLINE) 100 MG tablet Take 100 mg by mouth 2 (two) times daily.     JYNARQUE 90 & 30 MG TBPK Take 1 tablet by mouth in the morning and at bedtime.     metoprolol succinate (TOPROL-XL) 50 MG 24 hr tablet Take 50 mg by mouth daily.     rosuvastatin (CRESTOR) 10 MG tablet Take 1 tablet (10 mg total) by mouth daily. 90 tablet 3   sildenafil (VIAGRA) 100 MG tablet Take 0.5-1 tablets (50-100 mg total) by mouth daily as needed for  erectile dysfunction. 5 tablet 11   sodium bicarbonate 650 MG tablet Take 650 mg by mouth 2 (two) times daily.     tiZANidine (ZANAFLEX) 4 MG tablet Take 1 tablet (4 mg total) by mouth at bedtime. 30 tablet 0   gabapentin (NEURONTIN) 100 MG capsule TAKE 1 CAPSULE (100 MG TOTAL) BY MOUTH THREE TIMES DAILY. (Patient not taking: Reported on 11/28/2022) 90 capsule 3   No facility-administered medications prior to visit.   Review of Systems  Review of Systems  Constitutional:  Positive for fatigue.  Respiratory: Negative.    Psychiatric/Behavioral:  Positive for sleep disturbance.    Physical Exam  BP (!) 144/84 (BP Location: Left Arm, Patient Position: Sitting, Cuff Size: Large)   Pulse 96   Temp 98.4 F (36.9 C) (Oral)   Ht 5\' 8"  (1.727 m)   Wt 297 lb (134.7 kg)   SpO2 99%   BMI 45.16 kg/m  Physical Exam Constitutional:      General: He is not in acute distress.    Appearance: Normal appearance. He is not ill-appearing.  HENT:     Head: Normocephalic and atraumatic.     Mouth/Throat:     Mouth: Mucous membranes are moist.     Pharynx: Oropharynx is clear.  Cardiovascular:     Rate and Rhythm: Normal rate and regular rhythm.  Pulmonary:     Effort: Pulmonary effort is normal.     Breath sounds: Normal breath sounds.     Comments: CTA Musculoskeletal:        General: Normal range of motion.  Skin:    General: Skin is warm and dry.  Neurological:     General: No focal deficit present.     Mental Status: He is alert and oriented to person, place, and time. Mental status is at baseline.  Psychiatric:        Mood and Affect: Mood normal.        Behavior: Behavior normal.        Thought Content: Thought content normal.        Judgment: Judgment normal.      Lab Results:  CBC    Component Value Date/Time   WBC 7.0 04/21/2022 0959   WBC 6.9 12/11/2021 0801   RBC 3.89 (L) 04/21/2022 0959   RBC 3.69 (L) 12/11/2021 0801   HGB 11.7 (L) 04/21/2022 0959   HCT 35.3 (L)  04/21/2022 0959   PLT 284 04/21/2022 0959   MCV 91 04/21/2022 0959   MCH 30.1 04/21/2022 0959   MCH 31.7 12/11/2021 0801   MCHC 33.1 04/21/2022 0959   MCHC 34.6 12/11/2021 0801   RDW 14.6 04/21/2022  0959   LYMPHSABS 1.3 12/11/2021 0801   LYMPHSABS 1.6 09/29/2019 1402   MONOABS 0.4 12/11/2021 0801   EOSABS 0.1 12/11/2021 0801   EOSABS 0.2 09/29/2019 1402   BASOSABS 0.1 12/11/2021 0801   BASOSABS 0.1 09/29/2019 1402    BMET    Component Value Date/Time   NA 142 04/21/2022 0959   K 4.2 04/21/2022 0959   CL 106 04/21/2022 0959   CO2 19 (L) 04/21/2022 0959   GLUCOSE 171 (H) 04/21/2022 0959   GLUCOSE 253 (H) 12/13/2021 0413   BUN 29 (H) 04/21/2022 0959   CREATININE 3.75 (H) 04/21/2022 0959   CALCIUM 9.0 04/21/2022 0959   GFRNONAA 24 (L) 12/13/2021 0413   GFRAA 29 (L) 09/29/2019 1402    BNP No results found for: "BNP"  ProBNP No results found for: "PROBNP"  Imaging: No results found.   Assessment & Plan:   Mild sleep apnea - HST 10/25/22 showed very mild obstructive sleep apnea, AHI 5.9 apneic events an hour with SpO2 low 88%. Reviewed treatment options including weight loss, side sleeping position, oral appliance, CPAP therapy referral to ENT for possible surgical options. Patient elected to be referred for oral appliance. He will notify our office if oral appliance is not an affordable option for him and he would like to proceed with CPAP.   Recommendations Work on weight loss efforts Do not drive experiencing excessive daytime sleepiness fatigue Avoid excessive alcohol use or sedatives prior to bedtime as these can worsen underlying sleep apnea  Order: Refer to orthodontics Dr. Althea Grimmer [phone]   Follow-up 6 months with [facility] NP or sooner    Glenford Bayley, NP 11/28/2022

## 2022-11-28 NOTE — Patient Instructions (Addendum)
Sleep study showed very mild obstructive sleep apnea, you had on average 5.4 apneic events an hour  Options include weight loss, side sleeping position, oral appliance, CPAP therapy referral to ENT for possible surgical options  Due to mild severity of your sleep apnea we have decided oral appliance may be a good treatment options for you, we will refer you to orthodontics.  If this is not affordable option for you or you are not a candidate please send me a MyChart message if he would like to proceed with CPAP therapy  Recommendations Work on weight loss efforts Do not drive experiencing excessive daytime sleepiness fatigue Avoid excessive alcohol use or sedatives prior to bedtime as these can worsen underlying sleep apnea  Order: Refer to orthodontics Dr. Althea Grimmer [phone]   Follow-up 6 months with [facility] NP or sooner  Sleep Apnea Sleep apnea is a condition in which breathing pauses or becomes shallow during sleep. People with sleep apnea usually snore loudly. They may have times when they gasp and stop breathing for 10 seconds or more during sleep. This may happen many times during the night. Sleep apnea disrupts your sleep and keeps your body from getting the rest that it needs. This condition can increase your risk of certain health problems, including: Heart attack. Stroke. Obesity. Type 2 diabetes. Heart failure. Irregular heartbeat. High blood pressure. The goal of treatment is to help you breathe normally again. What are the causes?  The most common cause of sleep apnea is a collapsed or blocked airway. There are three kinds of sleep apnea: Obstructive sleep apnea. This kind is caused by a blocked or collapsed airway. Central sleep apnea. This kind happens when the part of the brain that controls breathing does not send the correct signals to the muscles that control breathing. Mixed sleep apnea. This is a combination of obstructive and central sleep apnea. What  increases the risk? You are more likely to develop this condition if you: Are overweight. Smoke. Have a smaller than normal airway. Are older. Are male. Drink alcohol. Take sedatives or tranquilizers. Have a family history of sleep apnea. Have a tongue or tonsils that are larger than normal. What are the signs or symptoms? Symptoms of this condition include: Trouble staying asleep. Loud snoring. Morning headaches. Waking up gasping. Dry mouth or sore throat in the morning. Daytime sleepiness and tiredness. If you have daytime fatigue because of sleep apnea, you may be more likely to have: Trouble concentrating. Forgetfulness. Irritability or mood swings. Personality changes. Feelings of depression. Sexual dysfunction. This may include loss of interest if you are male, or erectile dysfunction if you are male. How is this diagnosed? This condition may be diagnosed with: A medical history. A physical exam. A series of tests that are done while you are sleeping (sleep study). These tests are usually done in a sleep lab, but they may also be done at home. How is this treated? Treatment for this condition aims to restore normal breathing and to ease symptoms during sleep. It may involve managing health issues that can affect breathing, such as high blood pressure or obesity. Treatment may include: Sleeping on your side. Using a decongestant if you have nasal congestion. Avoiding the use of depressants, including alcohol, sedatives, and narcotics. Losing weight if you are overweight. Making changes to your diet. Quitting smoking. Using a device to open your airway while you sleep, such as: An oral appliance. This is a custom-made mouthpiece that shifts your lower jaw forward.  A continuous positive airway pressure (CPAP) device. This device blows air through a mask when you breathe out (exhale). A nasal expiratory positive airway pressure (EPAP) device. This device has valves that  you put into each nostril. A bi-level positive airway pressure (BIPAP) device. This device blows air through a mask when you breathe in (inhale) and breathe out (exhale). Having surgery if other treatments do not work. During surgery, excess tissue is removed to create a wider airway. Follow these instructions at home: Lifestyle Make any lifestyle changes that your health care provider recommends. Eat a healthy, well-balanced diet. Take steps to lose weight if you are overweight. Avoid using depressants, including alcohol, sedatives, and narcotics. Do not use any products that contain nicotine or tobacco. These products include cigarettes, chewing tobacco, and vaping devices, such as e-cigarettes. If you need help quitting, ask your health care provider. General instructions Take over-the-counter and prescription medicines only as told by your health care provider. If you were given a device to open your airway while you sleep, use it only as told by your health care provider. If you are having surgery, make sure to tell your health care provider you have sleep apnea. You may need to bring your device with you. Keep all follow-up visits. This is important. Contact a health care provider if: The device that you received to open your airway during sleep is uncomfortable or does not seem to be working. Your symptoms do not improve. Your symptoms get worse. Get help right away if: You develop: Chest pain. Shortness of breath. Discomfort in your back, arms, or stomach. You have: Trouble speaking. Weakness on one side of your body. Drooping in your face. These symptoms may represent a serious problem that is an emergency. Do not wait to see if the symptoms will go away. Get medical help right away. Call your local emergency services (911 in the [place] Do not drive yourself to the hospital. Summary Sleep apnea is a condition in which breathing pauses or becomes shallow during sleep. The most  common cause is a collapsed or blocked airway. The goal of treatment is to restore normal breathing and to ease symptoms during sleep. This information is not intended to replace advice given to you by your health care provider. Make sure you discuss any questions you have with your health care provider. Document Revised: 12/08/2020 Document Reviewed: 04/09/2020 Elsevier Patient Education  2024 [employer].

## 2022-11-28 NOTE — Assessment & Plan Note (Signed)
-   HST 10/25/22 showed very mild obstructive sleep apnea, AHI 5.9 apneic events an hour with SpO2 low 88%. Reviewed treatment options including weight loss, side sleeping position, oral appliance, CPAP therapy referral to ENT for possible surgical options. Patient elected to be referred for oral appliance. He will notify our office if oral appliance is not an affordable option for him and he would like to proceed with CPAP.   Recommendations Work on weight loss efforts Do not drive experiencing excessive daytime sleepiness fatigue Avoid excessive alcohol use or sedatives prior to bedtime as these can worsen underlying sleep apnea  Order: Refer to orthodontics Dr. Althea Grimmer [phone]   Follow-up 6 months with [facility] NP or sooner

## 2022-12-05 ENCOUNTER — Telehealth: Payer: Self-pay

## 2022-12-05 NOTE — Telephone Encounter (Unsigned)
A prior authorization request has been submitted to patients insurance today via CoverMyMeds Key: [id number]

## 2022-12-06 ENCOUNTER — Other Ambulatory Visit: Payer: Self-pay

## 2022-12-07 ENCOUNTER — Other Ambulatory Visit: Payer: Self-pay

## 2023-01-04 NOTE — Telephone Encounter (Signed)
Caller & Relationship to patient:  MRN #  [id number]   Call Back Number:   Date of Last Office Visit: 12/05/2022     Date of Next Office Visit: Visit date not found    Medication(s) to be Refilled: Trulicity  Preferred Pharmacy:   ** Please notify patient to allow 48-72 hours to process** **Let patient know to contact pharmacy at the end of the day to make sure medication is ready. ** **If patient has not been seen in a year or longer, book an appointment **Advise to use MyChart for refill requests OR to contact their pharmacy

## 2023-01-08 ENCOUNTER — Ambulatory Visit (INDEPENDENT_AMBULATORY_CARE_PROVIDER_SITE_OTHER): Payer: Medicaid Other | Admitting: Nurse Practitioner

## 2023-01-08 ENCOUNTER — Encounter: Payer: Self-pay | Admitting: Nurse Practitioner

## 2023-01-08 VITALS — BP 142/83 | HR 81 | Wt 300.0 lb

## 2023-01-08 DIAGNOSIS — Z6841 Body Mass Index (BMI) 40.0 and over, adult: Secondary | ICD-10-CM | POA: Diagnosis not present

## 2023-01-08 DIAGNOSIS — M25559 Pain in unspecified hip: Secondary | ICD-10-CM | POA: Diagnosis not present

## 2023-01-08 DIAGNOSIS — E66813 Obesity, class 3: Secondary | ICD-10-CM

## 2023-01-08 DIAGNOSIS — G8929 Other chronic pain: Secondary | ICD-10-CM

## 2023-01-08 MED ORDER — OXYCODONE-ACETAMINOPHEN 5-325 MG PO TABS
1.0000 | ORAL_TABLET | Freq: Three times a day (TID) | ORAL | 0 refills | Status: DC | PRN
Start: 2023-01-08 — End: 2023-01-29

## 2023-01-08 NOTE — Assessment & Plan Note (Signed)
-   Amb Ref to Medical Weight Management - AMB Referral to Pharmacy Medication Management   2. Chronic hip pain, unspecified laterality  - Ambulatory referral to Pain Clinic - oxyCODONE-acetaminophen (PERCOCET/ROXICET) 5-325 MG tablet; Take 1 tablet by mouth every 8 (eight) hours as needed for severe pain.  Dispense: 20 tablet; Refill: 0   Follow up:  Follow up in 3 months

## 2023-01-08 NOTE — Progress Notes (Signed)
Patient states to having some depression

## 2023-01-08 NOTE — Patient Instructions (Signed)
1. Class 3 severe obesity due to excess calories with serious comorbidity and body mass index (BMI) of 45.0 to 49.9 in adult (HCC)  - Amb Ref to Medical Weight Management - AMB Referral to Pharmacy Medication Management   2. Chronic hip pain, unspecified laterality  - Ambulatory referral to Pain Clinic - oxyCODONE-acetaminophen (PERCOCET/ROXICET) 5-325 MG tablet; Take 1 tablet by mouth every 8 (eight) hours as needed for severe pain.  Dispense: 20 tablet; Refill: 0   Follow up:  Follow up in 3 months

## 2023-01-08 NOTE — Progress Notes (Signed)
@Patient  ID: Kevin Todd, male    DOB: May 26, 1975, 47 y.o.   MRN: [id number]  Chief Complaint  Patient presents with   Hip Pain    Referring provider: Ivonne Andrew, NP   HPI  47 year old male with history of hypertension, type 2 diabetes, CKD, tobacco abuse, and obesity.    Patient presents today with hip pain.  He is currently being followed by Ortho and did see them today.  He was told that he does need a total hip replacement to both hips but does need to lose weight first.  We discussed that we will refer him to the weight loss clinic.  We will also give him a referral for pain clinic.  We will give him a short course of Percocet to use sparingly until seen by pain clinic. Denies f/c/s, n/v/d, hemoptysis, PND, leg swelling Denies chest pain or edema    Allergies  Allergen Reactions   Nsaids     Kidney Disease     There is no immunization history on file for this patient.  Past Medical History:  Diagnosis Date   Chronic kidney disease    Elevated creatine kinase 09/2019   Hypertension    Iliac crest spur of left hip    Insomnia 10/2019   Pneumothorax on right    Polycystic kidney disease    Vitamin D deficiency 09/2019    Tobacco History: Social History   Tobacco Use  Smoking Status Former   Average packs/day: 0.3 packs/day for 32.2 years (8.0 ttl pk-yrs)   Types: Cigarettes   Start date: 07/18/1990  Smokeless Tobacco Never  Tobacco Comments   4 cigarettes/pd recently quit 03/2021   Restarted smoking.  Quit smoking as of May 2024.  Updated 11/28/2022.  am   Counseling given: Not Answered Tobacco comments: 4 cigarettes/pd recently quit 03/2021 Restarted smoking.  Quit smoking as of May 2024.  Updated 11/28/2022.  am   Outpatient Encounter Medications as of 01/08/2023  Medication Sig   amLODipine (NORVASC) 10 MG tablet Take 1 tablet (10 mg total) by mouth daily.   Continuous Blood Gluc Sensor (FREESTYLE LIBRE 3 SENSOR) MISC Place 1 sensor on the skin  every 14 days. Use to check glucose continuously   Dulaglutide (TRULICITY) 1.5 MG/0.5ML SOPN Inject 1.5 mg into the skin once a week.   furosemide (LASIX) 40 MG tablet Take 40 mg by mouth daily.   gabapentin (NEURONTIN) 300 MG capsule Take 300 mg by mouth 3 (three) times daily.   glucose blood (ACCU-CHEK GUIDE) test strip Use as directed to test blood sugars BID   hydrALAZINE (APRESOLINE) 100 MG tablet Take 100 mg by mouth 2 (two) times daily.   JYNARQUE 90 & 30 MG TBPK Take 1 tablet by mouth in the morning and at bedtime.   metoprolol succinate (TOPROL-XL) 50 MG 24 hr tablet Take 50 mg by mouth daily.   oxyCODONE-acetaminophen (PERCOCET/ROXICET) 5-325 MG tablet Take 1 tablet by mouth every 8 (eight) hours as needed for severe pain.   rosuvastatin (CRESTOR) 10 MG tablet Take 1 tablet (10 mg total) by mouth daily.   sildenafil (VIAGRA) 100 MG tablet Take 0.5-1 tablets (50-100 mg total) by mouth daily as needed for erectile dysfunction.   sodium bicarbonate 650 MG tablet Take 650 mg by mouth 2 (two) times daily.   tiZANidine (ZANAFLEX) 4 MG tablet Take 1 tablet (4 mg total) by mouth at bedtime.   [DISCONTINUED] gabapentin (NEURONTIN) 100 MG capsule TAKE 1 CAPSULE (  100 MG TOTAL) BY MOUTH THREE TIMES DAILY.   No facility-administered encounter medications on file as of 01/08/2023.     Review of Systems  Review of Systems  Constitutional: Negative.   HENT: Negative.    Cardiovascular: Negative.   Gastrointestinal: Negative.   Musculoskeletal:        Bilateral hip pain  Allergic/Immunologic: Negative.   Neurological: Negative.   Psychiatric/Behavioral: Negative.         Physical Exam  BP (!) 142/83   Pulse 81   Wt 300 lb (136.1 kg)   SpO2 92%   BMI 45.61 kg/m   Wt Readings from Last 5 Encounters:  01/08/23 300 lb (136.1 kg)  11/28/22 297 lb (134.7 kg)  10/27/22 (!) 301 lb (136.5 kg)  08/11/22 (!) 301 lb (136.5 kg)  07/21/22 297 lb (134.7 kg)     Physical Exam Vitals and  nursing note reviewed.  Constitutional:      General: He is not in acute distress.    Appearance: He is well-developed.  Cardiovascular:     Rate and Rhythm: Normal rate and regular rhythm.  Pulmonary:     Effort: Pulmonary effort is normal.     Breath sounds: Normal breath sounds.  Musculoskeletal:     Right hip: Tenderness present. Decreased range of motion.     Left hip: Tenderness present. Decreased range of motion.  Skin:    General: Skin is warm and dry.  Neurological:     Mental Status: He is alert and oriented to person, place, and time.      Lab Results:  CBC    Component Value Date/Time   WBC 7.0 04/21/2022 0959   WBC 6.9 12/11/2021 0801   RBC 3.89 (L) 04/21/2022 0959   RBC 3.69 (L) 12/11/2021 0801   HGB 11.7 (L) 04/21/2022 0959   HCT 35.3 (L) 04/21/2022 0959   PLT 284 04/21/2022 0959   MCV 91 04/21/2022 0959   MCH 30.1 04/21/2022 0959   MCH 31.7 12/11/2021 0801   MCHC 33.1 04/21/2022 0959   MCHC 34.6 12/11/2021 0801   RDW 14.6 04/21/2022 0959   LYMPHSABS 1.3 12/11/2021 0801   LYMPHSABS 1.6 09/29/2019 1402   MONOABS 0.4 12/11/2021 0801   EOSABS 0.1 12/11/2021 0801   EOSABS 0.2 09/29/2019 1402   BASOSABS 0.1 12/11/2021 0801   BASOSABS 0.1 09/29/2019 1402    BMET    Component Value Date/Time   NA 142 04/21/2022 0959   K 4.2 04/21/2022 0959   CL 106 04/21/2022 0959   CO2 19 (L) 04/21/2022 0959   GLUCOSE 171 (H) 04/21/2022 0959   GLUCOSE 253 (H) 12/13/2021 0413   BUN 29 (H) 04/21/2022 0959   CREATININE 3.75 (H) 04/21/2022 0959   CALCIUM 9.0 04/21/2022 0959   GFRNONAA 24 (L) 12/13/2021 0413   GFRAA 29 (L) 09/29/2019 1402      Assessment & Plan:   Class 3 severe obesity due to excess calories with serious comorbidity and body mass index (BMI) of 45.0 to 49.9 in adult (HCC) - Amb Ref to Medical Weight Management - AMB Referral to Pharmacy Medication Management   2. Chronic hip pain, unspecified laterality  - Ambulatory referral to Pain  Clinic - oxyCODONE-acetaminophen (PERCOCET/ROXICET) 5-325 MG tablet; Take 1 tablet by mouth every 8 (eight) hours as needed for severe pain.  Dispense: 20 tablet; Refill: 0   Follow up:  Follow up in 3 months     Ivonne Andrew, NP 01/08/2023

## 2023-01-09 ENCOUNTER — Other Ambulatory Visit: Payer: Self-pay

## 2023-01-09 DIAGNOSIS — E1122 Type 2 diabetes mellitus with diabetic chronic kidney disease: Secondary | ICD-10-CM

## 2023-01-09 MED ORDER — TRULICITY 1.5 MG/0.5ML ~~LOC~~ SOAJ
1.5000 mg | SUBCUTANEOUS | 2 refills | Status: DC
Start: 2023-01-09 — End: 2023-02-22

## 2023-01-10 ENCOUNTER — Telehealth: Payer: Self-pay

## 2023-01-10 NOTE — Progress Notes (Signed)
   Care Guide Note  01/10/2023 Name: ROMY HALLMARK MRN: [id number] DOB: 1975-05-17  Referred by: Ivonne Andrew, NP Reason for referral : Care Coordination (Outreach to schedule with Pharmd )   JARY HAMMANS is a 47 y.o. year old male who is a primary care patient of Ivonne Andrew, NP. Courtney Paris was referred to the pharmacist for assistance related to DM.    Successful contact was made with the patient to discuss pharmacy services including being ready for the pharmacist to call at least 5 minutes before the scheduled appointment time, to have medication bottles and any blood sugar or blood pressure readings ready for review. The patient agreed to meet with the pharmacist via with the pharmacist via telephone visit on (date/time).  02/19/2023  Penne Lash, RMA Care Guide [facility]  [place], [place] [address] Direct Dial: [phone] [email or web address]

## 2023-01-12 ENCOUNTER — Encounter: Payer: Self-pay | Admitting: Physical Medicine & Rehabilitation

## 2023-01-29 ENCOUNTER — Other Ambulatory Visit: Payer: Self-pay | Admitting: Nurse Practitioner

## 2023-01-29 ENCOUNTER — Other Ambulatory Visit: Payer: Self-pay

## 2023-01-29 ENCOUNTER — Encounter: Payer: Self-pay | Admitting: Nurse Practitioner

## 2023-01-29 ENCOUNTER — Ambulatory Visit (INDEPENDENT_AMBULATORY_CARE_PROVIDER_SITE_OTHER): Payer: Medicaid Other | Admitting: Nurse Practitioner

## 2023-01-29 VITALS — BP 153/92 | HR 76 | Temp 98.6°F | Ht 69.0 in | Wt 303.0 lb

## 2023-01-29 DIAGNOSIS — M25559 Pain in unspecified hip: Secondary | ICD-10-CM | POA: Diagnosis not present

## 2023-01-29 DIAGNOSIS — Z13220 Encounter for screening for lipoid disorders: Secondary | ICD-10-CM

## 2023-01-29 DIAGNOSIS — E1122 Type 2 diabetes mellitus with diabetic chronic kidney disease: Secondary | ICD-10-CM | POA: Diagnosis not present

## 2023-01-29 DIAGNOSIS — N184 Chronic kidney disease, stage 4 (severe): Secondary | ICD-10-CM | POA: Diagnosis not present

## 2023-01-29 DIAGNOSIS — G8929 Other chronic pain: Secondary | ICD-10-CM

## 2023-01-29 LAB — POCT GLYCOSYLATED HEMOGLOBIN (HGB A1C): HbA1c, POC (controlled diabetic range): 6 % (ref 0.0–7.0)

## 2023-01-29 MED ORDER — TRAMADOL HCL 50 MG PO TABS
50.0000 mg | ORAL_TABLET | Freq: Two times a day (BID) | ORAL | 0 refills | Status: AC | PRN
Start: 2023-01-29 — End: 2023-02-05

## 2023-01-29 NOTE — Progress Notes (Signed)
Patient states for some reason he still can't get his pain medicine, pharacist advised him to speak to his pcp.

## 2023-01-29 NOTE — Progress Notes (Signed)
Subjective   Patient ID: Kevin Todd, male    DOB: [birth year], 47 y.o.   MRN: [id number]  Chief Complaint  Patient presents with   Medical Management of Chronic Issues    Referring provider: Ivonne Andrew, NP  Kevin Todd is a 47 y.o. male with Past Medical History: No date: Chronic kidney disease 09/2019: Elevated creatine kinase No date: Hypertension No date: Iliac crest spur of left hip 10/2019: Insomnia No date: Pneumothorax on right No date: Polycystic kidney disease 09/2019: Vitamin D deficiency   HPI  47 year old male with history of hypertension, type 2 diabetes, CKD, tobacco abuse, and obesity.    Patient presents today to follow-up on diabetes. A1C in office today is 6.0.  Patient is compliant with medications.  Patient does have chronic hip pain.  He is currently being followed by Ortho.  He was told that he does need a total hip replacement to both hips but does need to lose weight first.  We discussed that we will refer him to the weight loss clinic at last visit, but appointment has not been made yet.  We also referred him to pain clinic.  His appointment is in October. We will give him a short course of Ultram to use sparingly until seen by pain clinic. Denies f/c/s, n/v/d, hemoptysis, PND, leg swelling Denies chest pain or edema  PDMP reviewed this encounter.   Allergies  Allergen Reactions   Nsaids     Kidney Disease     There is no immunization history on file for this patient.  Tobacco History: Social History   Tobacco Use  Smoking Status Former   Average packs/day: 0.3 packs/day for 32.2 years (8.0 ttl pk-yrs)   Types: Cigarettes   Start date: 07/18/1990  Smokeless Tobacco Never  Tobacco Comments   4 cigarettes/pd recently quit 03/2021   Restarted smoking.  Quit smoking as of May 2024.  Updated 11/28/2022.  am   Counseling given: Not Answered Tobacco comments: 4 cigarettes/pd recently quit 03/2021 Restarted smoking.  Quit smoking as  of May 2024.  Updated 11/28/2022.  am   Outpatient Encounter Medications as of 01/29/2023  Medication Sig   amLODipine (NORVASC) 10 MG tablet Take 1 tablet (10 mg total) by mouth daily.   Continuous Blood Gluc Sensor (FREESTYLE LIBRE 3 SENSOR) MISC Place 1 sensor on the skin every 14 days. Use to check glucose continuously   Dulaglutide (TRULICITY) 1.5 MG/0.5ML SOPN Inject 1.5 mg into the skin once a week.   furosemide (LASIX) 40 MG tablet Take 40 mg by mouth daily.   gabapentin (NEURONTIN) 300 MG capsule Take 300 mg by mouth 3 (three) times daily.   glucose blood (ACCU-CHEK GUIDE) test strip Use as directed to test blood sugars BID   hydrALAZINE (APRESOLINE) 100 MG tablet Take 100 mg by mouth 2 (two) times daily.   JYNARQUE 90 & 30 MG TBPK Take 1 tablet by mouth in the morning and at bedtime.   metoprolol succinate (TOPROL-XL) 50 MG 24 hr tablet Take 50 mg by mouth daily.   rosuvastatin (CRESTOR) 10 MG tablet Take 1 tablet (10 mg total) by mouth daily.   sildenafil (VIAGRA) 100 MG tablet Take 0.5-1 tablets (50-100 mg total) by mouth daily as needed for erectile dysfunction.   sodium bicarbonate 650 MG tablet Take 650 mg by mouth 2 (two) times daily.   tiZANidine (ZANAFLEX) 4 MG tablet Take 1 tablet (4 mg total) by mouth at bedtime.  traMADol (ULTRAM) 50 MG tablet Take 1 tablet (50 mg total) by mouth every 12 (twelve) hours as needed for up to 7 days.   [DISCONTINUED] oxyCODONE-acetaminophen (PERCOCET/ROXICET) 5-325 MG tablet Take 1 tablet by mouth every 8 (eight) hours as needed for severe pain.   No facility-administered encounter medications on file as of 01/29/2023.    Review of Systems  Review of Systems  Constitutional: Negative.   HENT: Negative.    Cardiovascular: Negative.   Gastrointestinal: Negative.   Musculoskeletal:  Positive for arthralgias and myalgias.  Allergic/Immunologic: Negative.   Psychiatric/Behavioral: Negative.       Objective:   BP (!) 153/92   Pulse 76    Temp 98.6 F (37 C) (Oral)   Ht 5\' 9"  (1.753 m)   Wt (!) 303 lb (137.4 kg)   SpO2 100%   BMI 44.75 kg/m   Wt Readings from Last 5 Encounters:  01/29/23 (!) 303 lb (137.4 kg)  01/08/23 300 lb (136.1 kg)  11/28/22 297 lb (134.7 kg)  10/27/22 (!) 301 lb (136.5 kg)  08/11/22 (!) 301 lb (136.5 kg)     Physical Exam Vitals and nursing note reviewed.  Constitutional:      General: He is not in acute distress.    Appearance: He is well-developed.  Cardiovascular:     Rate and Rhythm: Normal rate and regular rhythm.  Pulmonary:     Effort: Pulmonary effort is normal.     Breath sounds: Normal breath sounds.  Skin:    General: Skin is warm and dry.  Neurological:     Mental Status: He is alert and oriented to person, place, and time.     Last hemoglobin A1c Lab Results  Component Value Date   HGBA1C 6.0 01/29/2023      Assessment & Plan:   Type 2 diabetes mellitus with stage 4 chronic kidney disease, without long-term current use of insulin (HCC) -     POCT glycosylated hemoglobin (Hb A1C) -     CBC -     Basic metabolic panel  Chronic hip pain, unspecified laterality -     traMADol HCl; Take 1 tablet (50 mg total) by mouth every 12 (twelve) hours as needed for up to 7 days.  Dispense: 14 tablet; Refill: 0  Lipid screening -     Lipid panel     Return in about 3 months (around 04/30/2023).   Ivonne Andrew, NP 01/29/2023

## 2023-01-29 NOTE — Patient Instructions (Signed)
1. Type 2 diabetes mellitus with stage 4 chronic kidney disease, without long-term current use of insulin (HCC)  - POCT glycosylated hemoglobin (Hb A1C) - CBC - Basic Metabolic Panel  2. Chronic hip pain, unspecified laterality  - traMADol (ULTRAM) 50 MG tablet; Take 1 tablet (50 mg total) by mouth every 12 (twelve) hours as needed for up to 7 days.  Dispense: 14 tablet; Refill: 0  3. Lipid screening  - Lipid Panel  Follow up:  Follow up in 3 months or sooner if needed

## 2023-01-30 LAB — CBC
Hematocrit: 38 % (ref 37.5–51.0)
Hemoglobin: 12.6 g/dL — ABNORMAL LOW (ref 13.0–17.7)
MCH: 30.9 pg (ref 26.6–33.0)
MCHC: 33.2 g/dL (ref 31.5–35.7)
MCV: 93 fL (ref 79–97)
Platelets: 267 10*3/uL (ref 150–450)
RBC: 4.08 x10E6/uL — ABNORMAL LOW (ref 4.14–5.80)
RDW: 13.2 % (ref 11.6–15.4)
WBC: 8.5 10*3/uL (ref 3.4–10.8)

## 2023-01-30 LAB — BASIC METABOLIC PANEL
BUN/Creatinine Ratio: 6 — ABNORMAL LOW (ref 9–20)
BUN: 22 mg/dL (ref 6–24)
CO2: 18 mmol/L — ABNORMAL LOW (ref 20–29)
Calcium: 9.1 mg/dL (ref 8.7–10.2)
Chloride: 109 mmol/L — ABNORMAL HIGH (ref 96–106)
Creatinine, Ser: 3.66 mg/dL — ABNORMAL HIGH (ref 0.76–1.27)
Glucose: 96 mg/dL (ref 70–99)
Potassium: 4.5 mmol/L (ref 3.5–5.2)
Sodium: 142 mmol/L (ref 134–144)
eGFR: 20 mL/min/{1.73_m2} — ABNORMAL LOW (ref >59)

## 2023-01-30 LAB — LIPID PANEL
Chol/HDL Ratio: 4.2 ratio (ref 0.0–5.0)
Cholesterol, Total: 159 mg/dL (ref 100–199)
HDL: 38 mg/dL — ABNORMAL LOW (ref >39)
LDL Chol Calc (NIH): 93 mg/dL (ref 0–99)
Triglycerides: 159 mg/dL — ABNORMAL HIGH (ref 0–149)
VLDL Cholesterol Cal: 28 mg/dL (ref 5–40)

## 2023-01-31 ENCOUNTER — Other Ambulatory Visit: Payer: Self-pay | Admitting: Nurse Practitioner

## 2023-01-31 DIAGNOSIS — N289 Disorder of kidney and ureter, unspecified: Secondary | ICD-10-CM

## 2023-02-01 ENCOUNTER — Encounter: Payer: Self-pay | Admitting: Podiatry

## 2023-02-01 ENCOUNTER — Encounter: Payer: Self-pay | Admitting: Physical Medicine & Rehabilitation

## 2023-02-01 ENCOUNTER — Encounter: Payer: Medicaid Other | Attending: Physical Medicine & Rehabilitation | Admitting: Physical Medicine & Rehabilitation

## 2023-02-01 ENCOUNTER — Ambulatory Visit (INDEPENDENT_AMBULATORY_CARE_PROVIDER_SITE_OTHER): Payer: Medicaid Other | Admitting: Podiatry

## 2023-02-01 ENCOUNTER — Other Ambulatory Visit: Payer: Self-pay

## 2023-02-01 VITALS — BP 165/111 | HR 83 | Ht 69.0 in | Wt 301.0 lb

## 2023-02-01 DIAGNOSIS — M79675 Pain in left toe(s): Secondary | ICD-10-CM | POA: Diagnosis not present

## 2023-02-01 DIAGNOSIS — M25551 Pain in right hip: Secondary | ICD-10-CM | POA: Diagnosis present

## 2023-02-01 DIAGNOSIS — Z79891 Long term (current) use of opiate analgesic: Secondary | ICD-10-CM | POA: Diagnosis not present

## 2023-02-01 DIAGNOSIS — F39 Unspecified mood [affective] disorder: Secondary | ICD-10-CM | POA: Diagnosis present

## 2023-02-01 DIAGNOSIS — N184 Chronic kidney disease, stage 4 (severe): Secondary | ICD-10-CM

## 2023-02-01 DIAGNOSIS — G894 Chronic pain syndrome: Secondary | ICD-10-CM | POA: Diagnosis present

## 2023-02-01 DIAGNOSIS — M25552 Pain in left hip: Secondary | ICD-10-CM | POA: Diagnosis present

## 2023-02-01 DIAGNOSIS — Z5181 Encounter for therapeutic drug level monitoring: Secondary | ICD-10-CM

## 2023-02-01 DIAGNOSIS — B351 Tinea unguium: Secondary | ICD-10-CM

## 2023-02-01 DIAGNOSIS — E1122 Type 2 diabetes mellitus with diabetic chronic kidney disease: Secondary | ICD-10-CM

## 2023-02-01 DIAGNOSIS — M79674 Pain in right toe(s): Secondary | ICD-10-CM

## 2023-02-01 MED ORDER — SERTRALINE HCL 50 MG PO TABS
50.0000 mg | ORAL_TABLET | Freq: Every day | ORAL | 1 refills | Status: DC
Start: 2023-02-01 — End: 2023-05-10

## 2023-02-01 NOTE — Progress Notes (Signed)
This patient returns to my office for at risk foot care.  This patient requires this care by a professional since this patient will be at risk due to having diabetes with kidney disease.  This patient is unable to cut nails himself since the patient cannot reach his nails.These nails are painful walking and wearing shoes.  This patient presents for at risk foot care today.  General Appearance  Alert, conversant and in no acute stress.  Vascular  Dorsalis pedis and posterior tibial  pulses are palpable  bilaterally.  Capillary return is within normal limits  bilaterally. Temperature is within normal limits  bilaterally.  Neurologic  Senn-Weinstein monofilament wire test within normal limits  bilaterally. Muscle power within normal limits bilaterally.  Nails Thick disfigured discolored nails with subungual debris  from hallux to fifth toes bilaterally. No evidence of bacterial infection or drainage bilaterally.  Orthopedic  No limitations of motion  feet .  No crepitus or effusions noted.  No bony pathology or digital deformities noted.  Skin  normotropic skin with no porokeratosis noted bilaterally.  No signs of infections or ulcers noted.     Onychomycosis  Pain in right toes  Pain in left toes  Consent was obtained for treatment procedures.   Mechanical debridement of nails 1-5  bilaterally performed with a nail nipper.  Filed with dremel without incident.    Return office visit    3 months                  Told patient to return for periodic foot care and evaluation due to potential at risk complications.   Helane Gunther DPM

## 2023-02-01 NOTE — Progress Notes (Addendum)
Subjective:    Patient ID: Kevin Todd, male    DOB: [birth year], 47 y.o.   MRN: [id number]  HPI    HPI  Kevin Todd is a 47 y.o. year old male  who  has a past medical history of Chronic kidney disease, Elevated creatine kinase (09/2019), Hypertension, Iliac crest spur of left hip, Insomnia (10/2019), Pneumothorax on right, Polycystic kidney disease, and Vitamin D deficiency (09/2019).   They are presenting to PM&R clinic as a new patient for pain management evaluation. They were referred by Kevin Todd for treatment of hip pain.  Patient reports he has been having severe hip pain worsening for about 5 to 6 years.  He reports both of his hips hurt however pain is the worst in his left hip.  He also has less severe pain in his lower back.  Pain is worsened with any kind of activity.  He says he cannot sleep due to the pain.  He also cannot sit for too long without the pain worsening.  He often has to change positions.  Walking severely worsens his pain.  He has been following with [facility] and has been given gabapentin.  He is also received IA injections for his bilateral hips without significant benefit reported.  Patient reports he would like to improve his activities so that he could potentially work one day.  He will occasionally have tingling along his left thigh.  Patient reports that orthopedics told him he would have to lose weight before he could be considered for hip replacement.  Patient reports his mood is significantly decreased and would like medication to help this.  He denies SI or HI.   Red flag symptoms: No red flags for back pain endorsed in Hx or ROS  Medications tried: Topical medications denies  Nsaids kidney disease Tylenol  minimal benefit Opiates Tramdol- waiting on insurance to approve  Gabapentin - Helps a little  TCAs - Denies  SNRIs - Denies    Other treatments: PT/OT  Therapy heped a little TENs unit - Denies Injections  Hip cortisone did not  help, appears these were intra-articular injections bilaterally Surgery  Denies   Goals for pain control: Improve his pain so he can be more active  Prior UDS results:     Component Value Date/Time   LABOPIA NONE DETECTED 02/28/2016 0315   COCAINSCRNUR NONE DETECTED 02/28/2016 0315   LABBENZ NONE DETECTED 02/28/2016 0315   AMPHETMU NONE DETECTED 02/28/2016 0315   THCU POSITIVE (A) 02/28/2016 0315   LABBARB NONE DETECTED 02/28/2016 0315      Pain Inventory Average Pain 9 Pain Right Now 5 My pain is sharp, stabbing, and aching  In the last 24 hours, has pain interfered with the following? General activity 6 Relation with others 7 Enjoyment of life 7 What TIME of day is your pain at its worst? morning , daytime, and night Sleep (in general) Poor  Pain is worse with: walking, bending, sitting, standing, and some activites Pain improves with: pacing activities and medication Relief from Meds: 6  walk with assistance how many minutes can you walk? 5 do you drive?  yes  not employed: date last employed . I need assistance with the following:  household duties  weakness numbness tremor trouble walking depression  Any changes since last visit?  no  Any changes since last visit?  no    Family History  Problem Relation Age of Onset   Hypertension Mother    Colon cancer  Maternal Grandfather    Stomach cancer Neg Hx    Esophageal cancer Neg Hx    Pancreatic cancer Neg Hx    Social History   Socioeconomic History   Marital status: Single    Spouse name: Not on file   Number of children: Not on file   Years of education: Not on file   Highest education level: Not on file  Occupational History   Not on file  Tobacco Use   Smoking status: Former    Average packs/day: 0.3 packs/day for 32.2 years (8.0 ttl pk-yrs)    Types: Cigarettes    Start date: 07/18/1990   Smokeless tobacco: Never   Tobacco comments:    4 cigarettes/pd recently quit 03/2021    Restarted  smoking.  Quit smoking as of May 2024.  Updated 11/28/2022.  am  Vaping Use   Vaping status: Never Used  Substance and Sexual Activity   Alcohol use: Not Currently   Drug use: No   Sexual activity: Yes  Other Topics Concern   Not on file  Social History Narrative   Not on file   Social Determinants of Health   Financial Resource Strain: Medium Risk (01/29/2023)   Overall Financial Resource Strain (CARDIA)    Difficulty of Paying Living Expenses: Somewhat hard  Food Insecurity: Food Insecurity Present (01/29/2023)   Hunger Vital Sign    Worried About Running Out of Food in the Last Year: Sometimes true    Ran Out of Food in the Last Year: Sometimes true  Transportation Needs: Not on file  Physical Activity: Insufficiently Active (01/29/2023)   Exercise Vital Sign    Days of Exercise per Week: 3 days    Minutes of Exercise per Session: 10 min  Stress: Stress Concern Present (01/29/2023)   [employer] of Occupational Health - Occupational Stress Questionnaire    Feeling of Stress : Very much  Social Connections: Moderately Isolated (01/29/2023)   Social Connection and Isolation Panel [NHANES]    Frequency of Communication with Friends and Family: More than three times a week    Frequency of Social Gatherings with Friends and Family: Once a week    Attends Religious Services: Never    Database administrator or Organizations: No    Attends Engineer, structural: Not on file    Marital Status: Living with partner   Past Surgical History:  Procedure Laterality Date   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 Right 04/14/2021   Procedure: Achilles tendon debridement and reconstruction, excision of Haglund;  Surgeon: Toni Arthurs, MD;  Location: [facility];  Service: Orthopedics;  Laterality: Right;   GASTROCNEMIUS RECESSION Right 04/14/2021   Procedure: Right gastroc recession;  Surgeon: Toni Arthurs, MD;  Location: [facility];   Service: Orthopedics;  Laterality: Right;   Past Medical History:  Diagnosis Date   Chronic kidney disease    Elevated creatine kinase 09/2019   Hypertension    Iliac crest spur of left hip    Insomnia 10/2019   Pneumothorax on right    Polycystic kidney disease    Vitamin D deficiency 09/2019   Ht 5\' 9"  (1.753 m)   Wt (!) 301 lb (136.5 kg)   BMI 44.45 kg/m   Opioid Risk Score:   Fall Risk Score:  `1  Depression screen [facility] 2/9     02/01/2023   11:13 AM 10/27/2022    9:55 AM 07/21/2022   10:16 AM 12/28/2021  1:52 PM 09/29/2019    1:17 PM  Depression screen PHQ 2/9  Decreased Interest 3 0 0 0 0  Down, Depressed, Hopeless 2 1 0 0 0  PHQ - 2 Score 5 1 0 0 0  Altered sleeping 3  3    Tired, decreased energy 3      Change in appetite 3  0    Feeling bad or failure about yourself  2  0    Trouble concentrating 1  0    Moving slowly or fidgety/restless 2  2    Suicidal thoughts 1  0    PHQ-9 Score 20  5    Difficult doing work/chores Extremely dIfficult  Not difficult at all        Review of Systems  Musculoskeletal:        L hip pain  All other systems reviewed and are negative.      Objective:   Physical Exam   Gen: no distress, normal appearing HEENT: oral mucosa pink and moist, NCAT Chest: normal effort, normal rate of breathing Abd: soft, non-distended Ext: no edema Psych: pleasant, normal affect Skin: intact Neuro:  RUE: 5/5 Deltoid, 5/5 Biceps, 5/5 Triceps, 5/5 Wrist Ext, 5/5 Grip LUE: 5/5 Deltoid, 5/5 Biceps, 5/5 Triceps, 5/5 Wrist Ext, 5/5 Grip RLE: HF 4+/5, KE 4+/5,  ADF 5/5, APF 5/5 LLE: HF 4+/5, KE 4+/5,  ADF 5/5, APF 5/5 -Hip and knee strength appears to be effort/pain limited DTR 1+ b/l LE Exam pain limited Sensory exam normal for light touch and pain in all 4 limbs. No limb ataxia or cerebellar signs. No abnormal tone appreciated.   Musculoskeletal:  Tender to palpation b/l Greater trochanter L spine paraspinal tenderness SLR negative  b/l  Pain with hip internal and external rotation L>R, pain with abduction and hip flexion L > R Antalgic gait, no assistive device Mild L spine paraspinal tenderness   Excerpt from [facility] note on 05/18/2022 I did review his x-rays from May 2022 and August of 2023 (left hip). Mild degenerative changes of the left hip. No other acute osseous abnormalities.     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  Denies SI or HI 5) Left thigh tingling 6) Obesity Body mass index is 44.45 kg/m.  Plan  1) continue gabapentin current dose as it is reported to be providing some benefit 2) PCP has ordered short course of tramadol and patient is waiting for insurance to approve this so he can try this, would like to see how he does with this medication 3) UDS and pain contract today, consider continuation of tramadol 50 mg twice daily as needed at next visit 4) Consider referral for hip nerve ablation [facility] , pt says she will think about this 5) Order Tens,4 lead for hip pain Zynex not covered 6) provided list of foods that can be helpful for pain 7) will start Zoloft 50 mg daily, could consider duloxetine however caution due to CKD 8) recommend weight loss weight loss, was referred by PCP to weight loss clinic.  I think this is a good idea if he will follow with this

## 2023-02-02 ENCOUNTER — Telehealth: Payer: Self-pay

## 2023-02-02 ENCOUNTER — Other Ambulatory Visit: Payer: Self-pay

## 2023-02-02 NOTE — Telephone Encounter (Signed)
Pharmacy Patient Advocate Encounter  Received notification from [facility] Medicaid that Prior Authorization for TRAMADOL has been APPROVED from 01/29/2023 to 07/28/2023   PA #/Case ID/Reference #: [id number]

## 2023-02-08 LAB — TOXASSURE SELECT,+ANTIDEPR,UR

## 2023-02-13 ENCOUNTER — Telehealth: Payer: Self-pay | Admitting: *Deleted

## 2023-02-13 NOTE — Telephone Encounter (Signed)
Carrolyn Meiers, case manager from [facility] called and says that Zynex is not in network for this patient. She gave me two companies that are in network. They are:  [employer] (out of [place])  ph [phone]  [employer] ph  [phone]

## 2023-02-16 ENCOUNTER — Encounter: Payer: Medicaid Other | Admitting: Physical Medicine & Rehabilitation

## 2023-02-19 ENCOUNTER — Other Ambulatory Visit: Payer: Medicaid Other | Admitting: Pharmacist

## 2023-02-22 ENCOUNTER — Other Ambulatory Visit (INDEPENDENT_AMBULATORY_CARE_PROVIDER_SITE_OTHER): Payer: Medicaid Other | Admitting: Pharmacist

## 2023-02-22 DIAGNOSIS — E1122 Type 2 diabetes mellitus with diabetic chronic kidney disease: Secondary | ICD-10-CM

## 2023-02-22 MED ORDER — ATORVASTATIN CALCIUM 80 MG PO TABS: 80.0000 mg | ORAL_TABLET | Freq: Every day | ORAL | 3 refills | Status: AC

## 2023-02-22 MED ORDER — OZEMPIC (0.25 OR 0.5 MG/DOSE) 2 MG/3ML ~~LOC~~ SOPN: 0.5000 mg | PEN_INJECTOR | SUBCUTANEOUS | 2 refills | Status: DC

## 2023-02-22 NOTE — Progress Notes (Signed)
02/22/2023 Name: Kevin Todd MRN: [id number] DOB: Dec 04, 1975  Chief Complaint  Patient presents with   Diabetes   Hypertension    Kevin Todd is a 47 y.o. year old male who presented for a telephone visit.   They were referred to the pharmacist by their PCP for assistance in managing diabetes and hypertension.    Subjective:  Care Team: Primary Care Provider: Ivonne Andrew, NP ; Next Scheduled Visit: 04/30/2023  Medication Access/Adherence  Current Pharmacy:  [id number] [place], [place] - [address] [facility] [address] [place] [address] Phone: [phone] Fax: [phone]  [facility] [address], [address] [place] [address] Phone: [phone] Fax: [phone]   Patient reports affordability concerns with their medications: No  Patient reports access/transportation concerns to their pharmacy: No  Patient reports adherence concerns with their medications:  No     Diabetes:  Current medications: Trulicity 1.5 mg weekly  Undergoing evaluation for kidney transplant. They have recommended change from Trulicity to Ozempic to aid in further weight loss. Ozempic also has superior evidence supporting reduction in risk of progression of renal disease.   Date of Download: 9/27-10/10/24 % Time CGM is active: 84% Average Glucose: 90 mg/dL Glucose Management Indicator: 5.5  Glucose Variability: 26.8 (goal <36%) Time in Goal:  - Time in range 70-180: 82% - Time above range: 0% - Time below range: 18%  Patient denies hypoglycemic s/sx including dizziness, shakiness, sweating. nocturia, neuropathy, blurred vision.  Hypertension:  Current medications: metoprolol succinate 50 mg daily, hydralazine 100 mg twice daily, amlodipine 10 mg daily, furosemide 40 mg daily   Patient has a validated, automated, upper arm home BP cuff Current blood pressure readings  readings: 130/80s, though has not recorded any recent readings. Notes when he comes to the office, he generally has taken his antihypertensives about 5-10 minutes before coming. Also notes a history of white coat hypertension.    Hyperlipidemia/ASCVD Risk Reduction  Current lipid lowering medications: rosuvastatin 10 mg daily     Objective:  Lab Results  Component Value Date   HGBA1C 6.0 01/29/2023    Lab Results  Component Value Date   CREATININE 3.66 (H) 01/29/2023   BUN 22 01/29/2023   NA 142 01/29/2023   K 4.5 01/29/2023   CL 109 (H) 01/29/2023   CO2 18 (L) 01/29/2023    Lab Results  Component Value Date   CHOL 159 01/29/2023   HDL 38 (L) 01/29/2023   LDLCALC 93 01/29/2023   TRIG 159 (H) 01/29/2023   CHOLHDL 4.2 01/29/2023    Medications Reviewed Today     Reviewed by Alden Hipp, RPH-CPP (Pharmacist) on 02/22/23 at 1356  Med List Status: <None>   Medication Order Taking? Sig Documenting Provider Last Dose Status Informant  amLODipine (NORVASC) 10 MG tablet [id number] Yes Take 1 tablet (10 mg total) by mouth daily. Marguerita Merles [place], DO Taking Active Self  Continuous Blood Gluc Sensor (FREESTYLE LIBRE 3 SENSOR) [place] [id number]  Place 1 sensor on the skin every 14 days. Use to check glucose continuously Ivonne Andrew, NP  Active   Dulaglutide (TRULICITY) 1.5 MG/0.5ML SOPN [id number] Yes Inject 1.5 mg into the skin once a week. Ivonne Andrew, NP Taking Active   furosemide (LASIX) 40 MG tablet [id number] Yes Take 40 mg by mouth daily. [provider] Taking Active   gabapentin (NEURONTIN) 300 MG capsule [id number] Yes  Take 300 mg by mouth 3 (three) times daily. [provider] Taking Active   glucose blood (ACCU-CHEK GUIDE) test strip [id number]  Use as directed to test blood sugars BID Ivonne Andrew, NP  Active   hydrALAZINE (APRESOLINE) 100 MG tablet [id number] Yes Take 100 mg by mouth 2 (two) times daily. [provider] Taking  Active   JYNARQUE 90 & 30 MG TBPK [id number] Yes Take 1 tablet by mouth in the morning and at bedtime. [provider] Taking Active Self  metoprolol succinate (TOPROL-XL) 50 MG 24 hr tablet [id number] Yes Take 50 mg by mouth daily. [provider] Taking Active Self  rosuvastatin (CRESTOR) 10 MG tablet [id number] Yes Take 1 tablet (10 mg total) by mouth daily. Ivonne Andrew, NP Taking Active   sertraline (ZOLOFT) 50 MG tablet [id number] Yes Take 1 tablet (50 mg total) by mouth daily. Fanny Dance, MD Taking Active   sildenafil (VIAGRA) 100 MG tablet [id number] Yes Take 0.5-1 tablets (50-100 mg total) by mouth daily as needed for erectile dysfunction. Ivonne Andrew, NP Taking Active   sodium bicarbonate 650 MG tablet [id number] Yes Take 650 mg by mouth 2 (two) times daily. [provider] Taking Active Self  tiZANidine (ZANAFLEX) 4 MG tablet [id number] Yes Take 1 tablet (4 mg total) by mouth at bedtime. Ivonne Andrew, NP Taking Active               Assessment/Plan:   Diabetes: - Currently controlled but with opportunity for optimization of complications of diabetes - Reviewed long term cardiovascular and renal outcomes of uncontrolled blood sugar - Recommend to start Ozempic 0.5 mg weekly, stop Trulicity. Will complete PA. Patient and provider are amenable.  - Unknown benefit of SGLT2 initiation at this eGFR. Will defer at this time.   Hypertension: - Currently uncontrolled per clinic readings but better controlled at home per patient report - Reviewed long term cardiovascular and renal outcomes of uncontrolled blood pressure - Reviewed appropriate blood pressure monitoring technique and reviewed goal blood pressure. Recommended to check home blood pressure and heart rate 2-3 times weekly, document, and provide at future appointments - Recommend to continue current regimen at this time   Hyperlipidemia/ASCVD Risk Reduction: - Currently uncontrolled,  goal <70. Rosuvastatin dose cannot be increased due to eGFR.  - Recommend to stop rosuvastatin, start atorvastatin 80 mg daily. Patient and provider are amenable.    Follow Up Plan: phone call in 4 weeks  Catie TClearance Coots, PharmD, BCACP, CPP Clinical Pharmacist [facility] [phone]

## 2023-02-22 NOTE — Patient Instructions (Addendum)
Corwin,   It was great talking to you today!  Finish your supply of Trulicity then start Ozempic 0.5 mg weekly. This medication may cause stomach upset, queasiness, or constipation, especially when first starting. This generally improves over time. Call our office if these symptoms occur and worsen, or if you have severe symptoms such as vomiting, diarrhea, or stomach pain- I do not want you to get dehydrated!   Stop rosuvastatin. Start atorvastatin 80 mg daily. Our goal LDL (bad cholesterol) is less than 70.   Please reach out with any questions or concerns!  Catie Eppie Gibson, PharmD, BCACP, CPP Clinical Pharmacist [facility] [phone]

## 2023-03-01 ENCOUNTER — Encounter: Payer: Self-pay | Admitting: Physical Medicine & Rehabilitation

## 2023-03-01 ENCOUNTER — Encounter: Payer: Medicaid Other | Attending: Physical Medicine & Rehabilitation | Admitting: Physical Medicine & Rehabilitation

## 2023-03-01 VITALS — BP 145/88 | HR 82 | Ht 69.0 in | Wt 299.0 lb

## 2023-03-01 DIAGNOSIS — F39 Unspecified mood [affective] disorder: Secondary | ICD-10-CM | POA: Diagnosis present

## 2023-03-01 DIAGNOSIS — Z79891 Long term (current) use of opiate analgesic: Secondary | ICD-10-CM | POA: Diagnosis not present

## 2023-03-01 DIAGNOSIS — M25552 Pain in left hip: Secondary | ICD-10-CM | POA: Insufficient documentation

## 2023-03-01 DIAGNOSIS — M25551 Pain in right hip: Secondary | ICD-10-CM | POA: Insufficient documentation

## 2023-03-01 MED ORDER — TRAMADOL HCL 50 MG PO TABS
50.0000 mg | ORAL_TABLET | Freq: Two times a day (BID) | ORAL | 0 refills | Status: DC | PRN
Start: 2023-03-01 — End: 2023-04-18

## 2023-03-01 NOTE — Progress Notes (Addendum)
Subjective:    Patient ID: Kevin Todd, male    DOB: [birth year], 47 y.o.   MRN: [id number]  HPI    HPI 02/01/2023  Kevin Todd is a 46 y.o. year old male  who  has a past medical history of Chronic kidney disease, Elevated creatine kinase (09/2019), Hypertension, Iliac crest spur of left hip, Insomnia (10/2019), Pneumothorax on right, Polycystic kidney disease, and Vitamin D deficiency (09/2019).   They are presenting to PM&R clinic as a new patient for pain management evaluation. They were referred by Angus Seller for treatment of hip pain.  Patient reports he has been having severe hip pain worsening for about 5 to 6 years.  He reports both of his hips hurt however pain is the worst in his left hip.  He also has less severe pain in his lower back.  Pain is worsened with any kind of activity.  He says he cannot sleep due to the pain.  He also cannot sit for too long without the pain worsening.  He often has to change positions.  Walking severely worsens his pain.  He has been following with [facility] and has been given gabapentin.  He is also received IA injections for his bilateral hips without significant benefit reported.  Patient reports he would like to improve his activities so that he could potentially work one day.  He will occasionally have tingling along his left thigh.  Patient reports that orthopedics told him he would have to lose weight before he could be considered for hip replacement.  Patient reports his mood is significantly decreased and would like medication to help this.  He denies SI or HI.   Red flag symptoms: No red flags for back pain endorsed in Hx or ROS  Medications tried: Topical medications denies  Nsaids kidney disease Tylenol  minimal benefit Opiates Tramdol- waiting on insurance to approve  Gabapentin - Helps a little  TCAs - Denies  SNRIs - Denies    Other treatments: PT/OT  Therapy heped a little TENs unit - Denies Injections  Hip cortisone  did not help, appears these were intra-articular injections bilaterally Surgery  Denies   Goals for pain control: Improve his pain so he can be more active     03/01/23 interval history Patient is here for follow-up regarding his chronic hip pain.  Pain largely unchanged since last visit.  Patient did try tramadol and oxycodone previously ordered by PCP.  He reports the tramadol decreased this pain however the Percocet provided better relief.  Patient reports he only used a few tabs of the Percocet medication since he picked it up a few weeks ago.  He would like to avoid stronger narcotic medications if possible.  Discussed UDS with 83 ng/mL carboxy THC noted.  Patient reported previously that he had been using a edible CBD product.  He reports he is no longer using this product as he was told not to do this last visit.  Patient says he will no longer use this product.  We discussed that on UDS I cannot distinguish these products from marijuana.  Prior UDS results:     Component Value Date/Time   LABOPIA NONE DETECTED 02/28/2016 0315   COCAINSCRNUR NONE DETECTED 02/28/2016 0315   LABBENZ NONE DETECTED 02/28/2016 0315   AMPHETMU NONE DETECTED 02/28/2016 0315   THCU POSITIVE (A) 02/28/2016 0315   LABBARB NONE DETECTED 02/28/2016 0315       Pain Inventory Average Pain 7 Pain Right  Now 5 My pain is sharp, stabbing, and aching  In the last 24 hours, has pain interfered with the following? General activity 5 Relation with others 5 Enjoyment of life 4 What TIME of day is your pain at its worst? daytime Sleep (in general) Poor  Pain is worse with: walking, sitting, standing, and some activites Pain improves with: rest, pacing activities, and medication Relief from Meds: 6      Family History  Problem Relation Age of Onset   Hypertension Mother    Colon cancer Maternal Grandfather    Stomach cancer Neg Hx    Esophageal cancer Neg Hx    Pancreatic cancer Neg Hx    Social  History   Socioeconomic History   Marital status: Single    Spouse name: Not on file   Number of children: Not on file   Years of education: Not on file   Highest education level: Not on file  Occupational History   Not on file  Tobacco Use   Smoking status: Former    Average packs/day: 0.3 packs/day for 32.2 years (8.0 ttl pk-yrs)    Types: Cigarettes    Start date: 07/18/1990   Smokeless tobacco: Never   Tobacco comments:    4 cigarettes/pd recently quit 03/2021    Restarted smoking.  Quit smoking as of May 2024.  Updated 11/28/2022.  am  Vaping Use   Vaping status: Never Used  Substance and Sexual Activity   Alcohol use: Not Currently   Drug use: No   Sexual activity: Yes  Other Topics Concern   Not on file  Social History Narrative   Not on file   Social Determinants of Health   Financial Resource Strain: Medium Risk (01/29/2023)   Overall Financial Resource Strain (CARDIA)    Difficulty of Paying Living Expenses: Somewhat hard  Food Insecurity: Food Insecurity Present (01/29/2023)   Hunger Vital Sign    Worried About Running Out of Food in the Last Year: Sometimes true    Ran Out of Food in the Last Year: Sometimes true  Transportation Needs: Not on file  Physical Activity: Insufficiently Active (01/29/2023)   Exercise Vital Sign    Days of Exercise per Week: 3 days    Minutes of Exercise per Session: 10 min  Stress: Stress Concern Present (01/29/2023)   [employer] of Occupational Health - Occupational Stress Questionnaire    Feeling of Stress : Very much  Social Connections: Moderately Isolated (01/29/2023)   Social Connection and Isolation Panel [NHANES]    Frequency of Communication with Friends and Family: More than three times a week    Frequency of Social Gatherings with Friends and Family: Once a week    Attends Religious Services: Never    Database administrator or Organizations: No    Attends Engineer, structural: Not on file    Marital  Status: Living with partner   Past Surgical History:  Procedure Laterality Date   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 Right 04/14/2021   Procedure: Achilles tendon debridement and reconstruction, excision of Haglund;  Surgeon: Toni Arthurs, MD;  Location: [facility];  Service: Orthopedics;  Laterality: Right;   GASTROCNEMIUS RECESSION Right 04/14/2021   Procedure: Right gastroc recession;  Surgeon: Toni Arthurs, MD;  Location: [facility];  Service: Orthopedics;  Laterality: Right;   Past Medical History:  Diagnosis Date   Chronic kidney disease    Elevated creatine kinase 09/2019   Hypertension  Iliac crest spur of left hip    Insomnia 10/2019   Pneumothorax on right    Polycystic kidney disease    Vitamin D deficiency 09/2019   There were no vitals taken for this visit.  Opioid Risk Score:   Fall Risk Score:  `1  Depression screen [facility] 2/9     02/01/2023   11:13 AM 10/27/2022    9:55 AM 07/21/2022   10:16 AM 12/28/2021    1:52 PM 09/29/2019    1:17 PM  Depression screen PHQ 2/9  Decreased Interest 3 0 0 0 0  Down, Depressed, Hopeless 2 1 0 0 0  PHQ - 2 Score 5 1 0 0 0  Altered sleeping 3  3    Tired, decreased energy 3      Change in appetite 3  0    Feeling bad or failure about yourself  2  0    Trouble concentrating 1  0    Moving slowly or fidgety/restless 2  2    Suicidal thoughts 1  0    PHQ-9 Score 20  5    Difficult doing work/chores Extremely dIfficult  Not difficult at all        Review of Systems  Musculoskeletal:        L hip pain  All other systems reviewed and are negative.      Objective:   Physical Exam   Gen: no distress, normal appearing HEENT: oral mucosa pink and moist, NCAT Chest: normal effort, normal rate of breathing Abd: soft, non-distended Ext: no edema Psych: pleasant, normal affect Skin: intact Neuro:  RUE: 5/5 Deltoid, 5/5 Biceps, 5/5 Triceps, 5/5 Wrist Ext, 5/5 Grip LUE: 5/5  Deltoid, 5/5 Biceps, 5/5 Triceps, 5/5 Wrist Ext, 5/5 Grip RLE: HF 4+/5, KE 4+/5,  ADF 5/5, APF 5/5 LLE: HF 4+/5, KE 4+/5,  ADF 5/5, APF 5/5 -Hip and knee strength appears to be effort/pain limited Exam pain limited Sensory exam normal for light touch and pain in all 4 limbs. No limb ataxia or cerebellar signs. No abnormal tone appreciated.   Musculoskeletal:  Tender to palpation b/l Greater trochanter Mild L spine paraspinal tenderness Slump test negative bilaterally Pain with hip internal and external rotation left greater than right    Excerpt from [facility] note on 05/18/2022 I did review his x-rays from May 2022 and August of 2023 (left hip). Mild degenerative changes of the left hip. No other acute osseous abnormalities.     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  Denies SI or HI 5) Left thigh tingling 6) Obesity There is no height or weight on file to calculate BMI.  Plan  1) Continue gabapentin current dose as it is reported to be providing some benefit 2) Will continue tramadol 50 mg twice daily, DC Percocet 3) UDS last visit with carboxy THC low level-this could be consistent with his report of using a locally purchased CBD compound previously.  Patient agrees to discontinue use of this product. Warning given 4) discussed referral for hip nerve ablation [facility] [place] , referral placed  5) Order Tens unit for pain Zynex not covered, ordered from [facility] 6) provided list of foods that can be helpful for pain prior visit 7) continue Zoloft 50 mg daily, could consider duloxetine  however caution due to CKD 8) recommend weight loss weight loss, was referred by PCP to weight loss clinic.  I think this is a good idea if he will follow with this

## 2023-03-21 NOTE — Progress Notes (Unsigned)
03/21/2023 Name: Kevin Todd MRN: [id number] DOB: 1976/04/18  No chief complaint on file.   Kevin Todd is a 47 y.o. year old male who presented for a telephone visit.   They were referred to the pharmacist by their PCP for assistance in managing diabetes and hypertension.    Subjective:  Care Team: Primary Care Provider: Ivonne Andrew, NP ; Next Scheduled Visit: 04/30/2023  Medication Access/Adherence  Current Pharmacy:  [id number] [place] Otto, [place] - [address] AT [facility] [address] [place] [address] Phone: [phone] Fax: [phone]  [facility] [address] [place] [address] Phone: [phone] Fax: [phone]   Patient reports affordability concerns with their medications: {YES/NO:21197} Patient reports access/transportation concerns to their pharmacy: {YES/NO:21197} Patient reports adherence concerns with their medications:  {YES/NO:21197} ***   Diabetes:  Undergoing evaluation for kidney transplant. They have recommended change from Trulicity to Ozempic to aid in further weight loss. Ozempic also has superior evidence supporting reduction in risk of progression of renal disease.   Current medications: Ozempic 0.5 mg weekly,   Current glucose readings: *** Using *** meter; testing *** times daily  Date of Download: 03/22/2023 - for 10/7-10/20 % Time CGM is active: 98% Average Glucose: 92 mg/dL Glucose Management Indicator: 5.5%  Glucose Variability: 22.4% (goal <36%) Time in Goal:  - Time in range 70-180: 0% - Time above range: 92%% - Time below range: 8%  Patient {Actions; denies-reports:120008} hypoglycemic s/sx including ***dizziness, shakiness, sweating. Patient {Actions; denies-reports:120008} hyperglycemic symptoms including ***polyuria, polydipsia, polyphagia, nocturia, neuropathy, blurred vision.  Current meal patterns:  -  Breakfast: *** - Lunch *** - Supper *** - Snacks *** - Drinks ***  Current physical activity: ***  Current medication access support: ***   Hypertension:  Current medications:  metoprolol succinate 50 mg daily, hydralazine 100 mg twice daily, amlodipine 10 mg daily, furosemide 40 mg daily   Patient has a validated, automated, upper arm home BP cuff Current blood pressure readings readings: ***  Patient {Actions; denies-reports:120008} hypotensive s/sx including ***dizziness, lightheadedness.  Patient {Actions; denies-reports:120008} hypertensive symptoms including ***headache, chest pain, shortness of breath   Hyperlipidemia/ASCVD Risk Reduction  Current lipid lowering medications: atorvastatin 80 mg daily   ASCVD History: none Family History: HTN (mother) Risk Factors: HTN, DM, HLD  The 10-year ASCVD risk score (Arnett DK, et al., 2019) is: 28.5%   Values used to calculate the score:     Age: 89 years     Sex: Male     Is Non-Hispanic African American: Yes     Diabetic: Yes     Tobacco smoker: Yes     Systolic Blood Pressure: 145 mmHg     Is BP treated: Yes     HDL Cholesterol: 38 mg/dL     Total Cholesterol: 159 mg/dL    Objective:  Lab Results  Component Value Date   HGBA1C 6.0 01/29/2023    Lab Results  Component Value Date   CREATININE 3.66 (H) 01/29/2023   BUN 22 01/29/2023   NA 142 01/29/2023   K 4.5 01/29/2023   CL 109 (H) 01/29/2023   CO2 18 (L) 01/29/2023    Lab Results  Component Value Date   CHOL 159 01/29/2023   HDL 38 (L) 01/29/2023   LDLCALC 93 01/29/2023   TRIG 159 (H) 01/29/2023   CHOLHDL 4.2 01/29/2023    Medications Reviewed Today   Medications  were not reviewed in this encounter      Assessment/Plan:   Diabetes: - Currently controlled - Reviewed long term cardiovascular and renal outcomes of uncontrolled blood sugar - Reviewed goal A1c, goal fasting, and goal 2 hour post prandial glucose - Reviewed dietary  modifications including *** - Reviewed lifestyle modifications including: - Recommend to ***  - Recommend to check glucose ***  Hypertension: - Currently {CHL [id number] - Reviewed long term cardiovascular and renal outcomes of uncontrolled blood pressure - Reviewed appropriate blood pressure monitoring technique and reviewed goal blood pressure. Recommended to check home blood pressure and heart rate *** - Recommend to ***   Hyperlipidemia/ASCVD Risk Reduction: - Currently uncontrolled per last lipid panel (LDL 93 - goal <70) - Reviewed long term complications of uncontrolled cholesterol - Reviewed dietary recommendations including *** - Reviewed lifestyle recommendations including *** - Recommend to continue atorvastatin 80 mg daily   Follow Up Plan: ***  Roslyn Smiling, PharmD PGY1 Pharmacy Resident 03/21/2023 8:39 PM

## 2023-03-22 ENCOUNTER — Other Ambulatory Visit: Payer: Self-pay

## 2023-03-22 ENCOUNTER — Telehealth: Payer: Self-pay | Admitting: Pharmacist

## 2023-03-22 NOTE — Telephone Encounter (Signed)
Attempted to contact patient for scheduled appointment for medication management. Left HIPAA compliant message for patient to return my call at their convenience.   Will send MyChart.  Catie T. Harper, PharmD, BCACP, CPP Clinical Pharmacist [facility] [phone]  

## 2023-04-10 ENCOUNTER — Encounter: Payer: Medicaid Other | Attending: Physical Medicine & Rehabilitation | Admitting: Physical Medicine & Rehabilitation

## 2023-04-10 ENCOUNTER — Encounter: Payer: Self-pay | Admitting: Physical Medicine & Rehabilitation

## 2023-04-10 ENCOUNTER — Ambulatory Visit: Payer: Medicaid Other | Admitting: Physical Medicine & Rehabilitation

## 2023-04-10 VITALS — BP 168/78 | HR 75 | Ht 69.0 in | Wt 293.0 lb

## 2023-04-10 DIAGNOSIS — N184 Chronic kidney disease, stage 4 (severe): Secondary | ICD-10-CM | POA: Insufficient documentation

## 2023-04-10 DIAGNOSIS — Z79891 Long term (current) use of opiate analgesic: Secondary | ICD-10-CM | POA: Insufficient documentation

## 2023-04-10 DIAGNOSIS — M25551 Pain in right hip: Secondary | ICD-10-CM | POA: Diagnosis present

## 2023-04-10 DIAGNOSIS — M25552 Pain in left hip: Secondary | ICD-10-CM | POA: Diagnosis not present

## 2023-04-10 NOTE — Progress Notes (Signed)
Subjective:    Patient ID: Kevin Todd, male    DOB: February 24, 1976, 47 y.o.   MRN: [id number]  HPI    HPI 02/01/2023  Kevin Todd is a 47 y.o. year old male  who  has a past medical history of Chronic kidney disease, Elevated creatine kinase (09/2019), Hypertension, Iliac crest spur of left hip, Insomnia (10/2019), Pneumothorax on right, Polycystic kidney disease, and Vitamin D deficiency (09/2019).   They are presenting to PM&R clinic as a new patient for pain management evaluation. They were referred by Angus Seller for treatment of hip pain.  Patient reports he has been having severe hip pain worsening for about 5 to 6 years.  He reports both of his hips hurt however pain is the worst in his left hip.  He also has less severe pain in his lower back.  Pain is worsened with any kind of activity.  He says he cannot sleep due to the pain.  He also cannot sit for too long without the pain worsening.  He often has to change positions.  Walking severely worsens his pain.  He has been following with [facility] and has been given gabapentin.  He is also received IA injections for his bilateral hips without significant benefit reported.  Patient reports he would like to improve his activities so that he could potentially work one day.  He will occasionally have tingling along his left thigh.  Patient reports that orthopedics told him he would have to lose weight before he could be considered for hip replacement.  Patient reports his mood is significantly decreased and would like medication to help this.  He denies SI or HI.   Red flag symptoms: No red flags for back pain endorsed in Hx or ROS  Medications tried: Topical medications denies  Nsaids kidney disease Tylenol  minimal benefit Opiates Tramdol- waiting on insurance to approve  Gabapentin - Helps a little  TCAs - Denies  SNRIs - Denies    Other treatments: PT/OT  Therapy heped a little TENs unit - Denies Injections  Hip cortisone  did not help, appears these were intra-articular injections bilaterally Surgery  Denies   Goals for pain control: Improve his pain so he can be more active     03/01/23 Interval history Patient is here for follow-up regarding his chronic hip pain.  Pain largely unchanged since last visit.  Patient did try tramadol and oxycodone previously ordered by PCP.  He reports the tramadol decreased this pain however the Percocet provided better relief.  Patient reports he only used a few tabs of the Percocet medication since he picked it up a few weeks ago.  He would like to avoid stronger narcotic medications if possible.  Discussed UDS with 83 ng/mL carboxy THC noted.  Patient reported previously that he had been using a edible CBD product.  He reports he is no longer using this product as he was told not to do this last visit.  Patient says he will no longer use this product.  We discussed that on UDS I cannot distinguish these products from marijuana.  04/10/23 Interval History  Patient is here for follow-up regarding his chronic pain.  Patient reports that tramadol continues to help his pain however he has some days where he feels like it is not strong enough to keep his pain under control.  His prior Percocet 5 mg tabs were better when pain is particularly severe.  He is not having any side effects  with this medication.  TENS unit provides mild relief.  Patient reports he could not have hip nerve ablation due to insurance issue however he says [facility] is still working on this possibility.  Prior UDS results:     Component Value Date/Time   LABOPIA NONE DETECTED 02/28/2016 0315   COCAINSCRNUR NONE DETECTED 02/28/2016 0315   LABBENZ NONE DETECTED 02/28/2016 0315   AMPHETMU NONE DETECTED 02/28/2016 0315   THCU POSITIVE (A) 02/28/2016 0315   LABBARB NONE DETECTED 02/28/2016 0315       Pain Inventory Average Pain 6 Pain Right Now 6 My pain is sharp, stabbing, and aching  In  the last 24 hours, has pain interfered with the following? General activity 5 Relation with others 7 Enjoyment of life 6 What TIME of day is your pain at its worst? daytime and night Sleep (in general) Poor  Pain is worse with: walking, sitting, standing, and some activites Pain improves with: pacing activities and medication Relief from Meds: 5      Family History  Problem Relation Age of Onset   Hypertension Mother    Colon cancer Maternal Grandfather    Stomach cancer Neg Hx    Esophageal cancer Neg Hx    Pancreatic cancer Neg Hx    Social History   Socioeconomic History   Marital status: Single    Spouse name: Not on file   Number of children: Not on file   Years of education: Not on file   Highest education level: Not on file  Occupational History   Not on file  Tobacco Use   Smoking status: Former    Average packs/day: 0.3 packs/day for 32.2 years (8.0 ttl pk-yrs)    Types: Cigarettes    Start date: 07/18/1990   Smokeless tobacco: Never   Tobacco comments:    4 cigarettes/pd recently quit 03/2021    Restarted smoking.  Quit smoking as of May 2024.  Updated 11/28/2022.  am  Vaping Use   Vaping status: Never Used  Substance and Sexual Activity   Alcohol use: Not Currently   Drug use: No   Sexual activity: Yes  Other Topics Concern   Not on file  Social History Narrative   Not on file   Social Determinants of Health   Financial Resource Strain: Medium Risk (01/29/2023)   Overall Financial Resource Strain (CARDIA)    Difficulty of Paying Living Expenses: Somewhat hard  Food Insecurity: Food Insecurity Present (01/29/2023)   Hunger Vital Sign    Worried About Running Out of Food in the Last Year: Sometimes true    Ran Out of Food in the Last Year: Sometimes true  Transportation Needs: Not on file  Physical Activity: Insufficiently Active (01/29/2023)   Exercise Vital Sign    Days of Exercise per Week: 3 days    Minutes of Exercise per Session: 10 min   Stress: Stress Concern Present (01/29/2023)   [employer] of Occupational Health - Occupational Stress Questionnaire    Feeling of Stress : Very much  Social Connections: Unknown (03/12/2023)   Received from [facility]   Social Network    Social Network: Not on file  Recent Concern: Social Connections - Moderately Isolated (01/29/2023)   Social Connection and Isolation Panel [NHANES]    Frequency of Communication with Friends and Family: More than three times a week    Frequency of Social Gatherings with Friends and Family: Once a week    Attends Religious Services: Never  Active Member of Clubs or Organizations: No    Attends Engineer, structural: Not on file    Marital Status: Living with partner   Past Surgical History:  Procedure Laterality Date   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 Right 04/14/2021   Procedure: Achilles tendon debridement and reconstruction, excision of Haglund;  Surgeon: Toni Arthurs, MD;  Location: [facility];  Service: Orthopedics;  Laterality: Right;   GASTROCNEMIUS RECESSION Right 04/14/2021   Procedure: Right gastroc recession;  Surgeon: Toni Arthurs, MD;  Location:  SURGERY CENTER;  Service: Orthopedics;  Laterality: Right;   Past Medical History:  Diagnosis Date   Chronic kidney disease    Elevated creatine kinase 09/2019   Hypertension    Iliac crest spur of left hip    Insomnia 10/2019   Pneumothorax on right    Polycystic kidney disease    Vitamin D deficiency 09/2019   Ht 5\' 9"  (1.753 m)   BMI 44.15 kg/m   Opioid Risk Score:   Fall Risk Score:  `1  Depression screen [facility] 2/9     03/01/2023   10:17 AM 02/01/2023   11:13 AM 10/27/2022    9:55 AM 07/21/2022   10:16 AM 12/28/2021    1:52 PM 09/29/2019    1:17 PM  Depression screen PHQ 2/9  Decreased Interest 0 3 0 0 0 0  Down, Depressed, Hopeless 0 2 1 0 0 0  PHQ - 2 Score 0 5 1 0 0 0  Altered sleeping  3  3    Tired,  decreased energy  3      Change in appetite  3  0    Feeling bad or failure about yourself   2  0    Trouble concentrating  1  0    Moving slowly or fidgety/restless  2  2    Suicidal thoughts  1  0    PHQ-9 Score  20  5    Difficult doing work/chores  Extremely dIfficult  Not difficult at all        Review of Systems  Musculoskeletal:        L hip pain  All other systems reviewed and are negative.      Objective:   Physical Exam   Gen: no distress, normal appearing HEENT: oral mucosa pink and moist, NCAT Chest: normal effort, normal rate of breathing Abd: soft, non-distended Ext: no edema Psych: pleasant, normal affect Skin: intact Neuro:  RUE: 5/5 Deltoid, 5/5 Biceps, 5/5 Triceps, 5/5 Wrist Ext, 5/5 Grip LUE: 5/5 Deltoid, 5/5 Biceps, 5/5 Triceps, 5/5 Wrist Ext, 5/5 Grip RLE: HF 4+/5, KE 4+/5,  ADF 5/5, APF 5/5 LLE: HF 4+/5, KE 4+/5,  ADF 5/5, APF 5/5 -Hip and knee strength appears to be effort/pain limited Exam pain limited Sensory exam normal for light touch and pain in all 4 limbs. No limb ataxia or cerebellar signs. No abnormal tone appreciated.   Musculoskeletal:  Slight tender to palpation b/l Greater trochanter Nontender today L spine paraspinal tenderness Slump test negative bilaterally Pain with hip internal more than external rotation left greater than right    Excerpt from [facility] note on 05/18/2022 I did review his x-rays from May 2022 and August of 2023 (left hip). Mild degenerative changes of the left hip. No other acute osseous abnormalities.     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  Denies SI or HI 5) Left thigh tingling 6) Obesity Body mass index is 44.15 kg/m.  Plan  1) Continue gabapentin current dose as it is reported to be providing some benefit 2)  Will continue tramadol but increase to 50 - 100mg  twice daily, DC Percocet, max dose with current renal function 3) UDS last visit with carboxy THC low level-this could be consistent with his report of using a locally purchased CBD compound previously.  Reviewed DC of this product again today 4) Nerve ablation [facility] referral last visit- pt reports working on insurance 5) Order Tens unit for pain Zynex not covered, ordered from [facility]- Mild benefit  6) Provided list of foods that can be helpful for pain prior visit 7) Pt on Zoloft 50 mg daily, could consider duloxetine however caution due to CKD 8) Recommend weight loss 9) Continue PDMP monitoring, random UDS, Pill counts- forgot today, warning

## 2023-04-18 ENCOUNTER — Telehealth: Payer: Self-pay | Admitting: Specialist

## 2023-04-18 ENCOUNTER — Other Ambulatory Visit: Payer: Self-pay | Admitting: Physical Medicine & Rehabilitation

## 2023-04-18 MED ORDER — TRAMADOL HCL 50 MG PO TABS: 50.0000 mg | ORAL_TABLET | Freq: Two times a day (BID) | ORAL | 0 refills | Status: DC | PRN

## 2023-04-18 NOTE — Telephone Encounter (Signed)
The medication refilled recently for patient was called in to the wrong pharmacy.  [facility] needs [facility] [address] In [place].  Please call the patient. Shirlean Mylar, MHA, OT/L [phone]

## 2023-04-19 ENCOUNTER — Other Ambulatory Visit (INDEPENDENT_AMBULATORY_CARE_PROVIDER_SITE_OTHER): Payer: Medicaid Other

## 2023-04-19 VITALS — BP 135/75

## 2023-04-19 DIAGNOSIS — N184 Chronic kidney disease, stage 4 (severe): Secondary | ICD-10-CM

## 2023-04-19 DIAGNOSIS — E1122 Type 2 diabetes mellitus with diabetic chronic kidney disease: Secondary | ICD-10-CM

## 2023-04-19 DIAGNOSIS — I10 Essential (primary) hypertension: Secondary | ICD-10-CM

## 2023-04-19 DIAGNOSIS — E782 Mixed hyperlipidemia: Secondary | ICD-10-CM

## 2023-04-19 MED ORDER — SEMAGLUTIDE (1 MG/DOSE) 4 MG/3ML ~~LOC~~ SOPN: 1.0000 mg | PEN_INJECTOR | SUBCUTANEOUS | 2 refills | Status: DC

## 2023-04-19 NOTE — Progress Notes (Signed)
04/19/2023 Name: Kevin Todd MRN: [id number] DOB: 12/26/1975  Chief Complaint  Patient presents with   Medication Management   Diabetes   Hypertension    Kevin Todd is a 47 y.o. year old male who presented for a telephone visit.   They were referred to the pharmacist by their PCP for assistance in managing diabetes.    Subjective:  Care Team: Primary Care Provider: Ivonne Andrew, NP ; Next Scheduled Visit: 04/30/23  Medication Access/Adherence  Current Pharmacy:  [id number] [place], [place] - [address] AT [facility] [address] [place] [address] Phone: [phone] Fax: [phone]  [facility] - [facility] [address], [address] [place] [address] Phone: [phone] Fax: [phone]   Patient reports affordability concerns with their medications: No  Patient reports access/transportation concerns to their pharmacy: No  Patient reports adherence concerns with their medications:  No     Diabetes:  Current medications: Ozempic 0.5 mg weekly Medications tried in the past: Trulicity   Notes some nausea if he has not eaten, but overall reports tolerating well. Nausea is not impacting his ability to remain well hydrated  Baseline weight: 299 lbs Most recent weight: 293 lbs Weight loss thus far: 2%   Patient denies hypoglycemic s/sx including dizziness, shakiness, sweating. Patient denies hyperglycemic symptoms including polyuria, polydipsia, polyphagia, nocturia, neuropathy, blurred vision.  Current meal patterns:  - Breakfast: generally skipping;  - Lunch/Supper: chicken, vegetables, small serving sizes of carbohydrates - Snacks: fruits - grapes, oranges, strawberries;  - Drinks: water, sometimes gatorade or half and half lemonade   Current physical activity: limited by OA pain, but has been doing some upper body muscle strengthening exercise    Hypertension:  Current medications: amlodipine 10 mg dialy, hydralazine 100 mg twice daily, metoprolol succinate 50 mg daily  Patient has a validated, automated, upper arm home BP cuff Current blood pressure readings readings: 130-140s/70-80s, but reports he is not always resting before checking  Patient denies hypotensive s/sx including dizziness, lightheadedness.  Patient denies hypertensive symptoms including headache, chest pain, shortness of breath  Hyperlipidemia/ASCVD Risk Reduction  Current lipid lowering medications: atorvastatin 80 mg daily  Clinical ASCVD: No  The 10-year ASCVD risk score (Arnett DK, et al., 2019) is: 25.3%   Values used to calculate the score:     Age: 53 years     Sex: Male     Is Non-Hispanic African American: Yes     Diabetic: Yes     Tobacco smoker: Yes     Systolic Blood Pressure: 135 mmHg     Is BP treated: Yes     HDL Cholesterol: 38 mg/dL     Total Cholesterol: 159 mg/dL     Objective:  Lab Results  Component Value Date   HGBA1C 6.0 01/29/2023    Lab Results  Component Value Date   CREATININE 3.66 (H) 01/29/2023   BUN 22 01/29/2023   NA 142 01/29/2023   K 4.5 01/29/2023   CL 109 (H) 01/29/2023   CO2 18 (L) 01/29/2023    Lab Results  Component Value Date   CHOL 159 01/29/2023   HDL 38 (L) 01/29/2023   LDLCALC 93 01/29/2023   TRIG 159 (H) 01/29/2023   CHOLHDL 4.2 01/29/2023    Medications Reviewed Today     Reviewed by Alden Hipp, RPH-CPP (Pharmacist) on 04/19/23 at 1107  Med List Status: <None>   Medication Order Taking?  Sig Documenting Provider Last Dose Status Informant  amLODipine (NORVASC) 10 MG tablet [id number] Yes Take 1 tablet (10 mg total) by mouth daily. Marguerita Merles [place], DO Taking Active Self  atorvastatin (LIPITOR) 80 MG tablet [id number] Yes Take 1 tablet (80 mg total) by mouth daily. Ivonne Andrew, NP Taking Active   Continuous Blood Gluc Sensor (FREESTYLE LIBRE 3 SENSOR) [place] [id number]  Yes Place 1 sensor on the skin every 14 days. Use to check glucose continuously Ivonne Andrew, NP Taking Active   furosemide (LASIX) 40 MG tablet [id number] Yes Take 40 mg by mouth daily. [provider] Taking Active   gabapentin (NEURONTIN) 300 MG capsule [id number] Yes Take 300 mg by mouth 3 (three) times daily. [provider] Taking Active   glucose blood (ACCU-CHEK GUIDE) test strip [id number]  Use as directed to test blood sugars BID Ivonne Andrew, NP  Active   hydrALAZINE (APRESOLINE) 100 MG tablet [id number] Yes Take 100 mg by mouth 2 (two) times daily. [provider] Taking Active   JYNARQUE 90 & 30 MG TBPK [id number] Yes Take 1 tablet by mouth in the morning and at bedtime. [provider] Taking Active Self  metoprolol succinate (TOPROL-XL) 50 MG 24 hr tablet [id number] Yes Take 50 mg by mouth daily. [provider] Taking Active Self  Semaglutide, 1 MG/DOSE, 4 MG/3ML SOPN [id number] Yes Inject 1 mg as directed once a week. Ivonne Andrew, NP Taking Active   sertraline (ZOLOFT) 50 MG tablet [id number] Yes Take 1 tablet (50 mg total) by mouth daily. Fanny Dance, MD Taking Active   sildenafil (VIAGRA) 100 MG tablet [id number] Yes Take 0.5-1 tablets (50-100 mg total) by mouth daily as needed for erectile dysfunction. Ivonne Andrew, NP Taking Active   sodium bicarbonate 650 MG tablet [id number] Yes Take 650 mg by mouth 2 (two) times daily. [provider] Taking Active Self  tiZANidine (ZANAFLEX) 4 MG tablet [id number] Yes Take 1 tablet (4 mg total) by mouth at bedtime. Ivonne Andrew, NP Taking Active   traMADol Janean Sark) 50 MG tablet [id number] Yes Take 1-2 tablets (50-100 mg total) by mouth 2 (two) times daily as needed for moderate pain (pain score 4-6). Fanny Dance, MD Taking Active               Assessment/Plan:   Diabetes: - Currently controlled but with opportunity for cardiometabolic optimization - Reviewed  dietary modifications including: incorporating protein (moderate portions) into meals. Consider protein shakes or other light sources of protein for breakfast to help reduce queasiness with an empty stomach - Reviewed lifestyle modifications including: physical activity as able - Recommend to increase Ozempic to 1 mg weekly. Will collaborate with PCP for order   Hypertension: - Currently controlled per home readings - Reviewed long term cardiovascular and renal outcomes of uncontrolled blood pressure - Reviewed appropriate blood pressure monitoring technique and reviewed goal blood pressure. Recommended to check home blood pressure and heart rate periodically and report at future visits - Recommend to continue current regimen at this time  Hyperlipidemia/ASCVD Risk Reduction: - Currently uncontrolled, goal <70 - Recommend to continue current regimen at this time, check lipids with upcomign appointment    Follow Up Plan: phone call in 5 weeks  Catie Eppie Gibson, PharmD, BCACP, CPP Clinical Pharmacist [facility] [phone]

## 2023-04-19 NOTE — Patient Instructions (Signed)
Espiridion,   It was great talking to you today!  Increase Ozempic to 1 mg weekly. Consider a small snack with some protein in it for breakfast to see if this helps reduce the queasiness.   Please reach out with any questions or concerns!  Catie Eppie Gibson, PharmD, BCACP, CPP Clinical Pharmacist [facility] [phone]

## 2023-04-30 ENCOUNTER — Ambulatory Visit (INDEPENDENT_AMBULATORY_CARE_PROVIDER_SITE_OTHER): Payer: Medicaid Other | Admitting: Nurse Practitioner

## 2023-04-30 VITALS — BP 136/81 | HR 95 | Temp 97.2°F | Wt 289.6 lb

## 2023-04-30 DIAGNOSIS — N184 Chronic kidney disease, stage 4 (severe): Secondary | ICD-10-CM | POA: Diagnosis not present

## 2023-04-30 DIAGNOSIS — E1122 Type 2 diabetes mellitus with diabetic chronic kidney disease: Secondary | ICD-10-CM | POA: Diagnosis not present

## 2023-04-30 LAB — POCT GLYCOSYLATED HEMOGLOBIN (HGB A1C): Hemoglobin A1C: 5.7 % — AB (ref 4.0–5.6)

## 2023-04-30 NOTE — Progress Notes (Signed)
Subjective   Patient ID: Kevin Todd, male    DOB: [birth year], 47 y.o.   MRN: [id number]  Chief Complaint  Patient presents with   Diabetes    Referring provider: Ivonne Andrew, NP  Kevin Todd is a 47 y.o. male with Past Medical History: No date: Chronic kidney disease 09/2019: Elevated creatine kinase No date: Hypertension No date: Iliac crest spur of left hip 10/2019: Insomnia No date: Pneumothorax on right No date: Polycystic kidney disease 09/2019: Vitamin D deficiency  HPI   Patient presents today to follow-up on diabetes. A1C in office today is 5.7.  Patient is compliant with medications.  [facility]. Patient does have chronic hip pain.  He is currently being followed by Ortho.  He was told that he does need a total hip replacement to both hips but does need to lose weight first.  We refered him to the weight loss clinic at last visit, but appointment has not been made yet.  We also referred him to pain clinic. He has followed with them. Denies f/c/s, n/v/d, hemoptysis, PND, leg swelling. Denies chest pain or edema.     Allergies  Allergen Reactions   Nsaids     Kidney Disease  Other Reaction(s): Other (See Comments)  Renal Disease    Immunization History  Administered Date(s) Administered   Moderna Sars-Covid-2 Vaccination 01/15/2020   PFIZER(Purple Top)SARS-COV-2 Vaccination 12/18/2019    Tobacco History: Social History   Tobacco Use  Smoking Status Former   Average packs/day: 0.3 packs/day for 32.2 years (8.0 ttl pk-yrs)   Types: Cigarettes   Start date: 07/18/1990  Smokeless Tobacco Never  Tobacco Comments   4 cigarettes/pd recently quit 03/2021   Restarted smoking.  Quit smoking as of May 2024.  Updated 11/28/2022.  am   Counseling given: Not Answered Tobacco comments: 4 cigarettes/pd recently quit 03/2021 Restarted smoking.  Quit smoking as of May 2024.  Updated 11/28/2022.  am   Outpatient  Encounter Medications as of 04/30/2023  Medication Sig   amLODipine (NORVASC) 10 MG tablet Take 1 tablet (10 mg total) by mouth daily.   atorvastatin (LIPITOR) 80 MG tablet Take 1 tablet (80 mg total) by mouth daily.   furosemide (LASIX) 40 MG tablet Take 40 mg by mouth daily.   gabapentin (NEURONTIN) 300 MG capsule Take 300 mg by mouth 3 (three) times daily.   hydrALAZINE (APRESOLINE) 100 MG tablet Take 100 mg by mouth 2 (two) times daily.   JYNARQUE 90 & 30 MG TBPK Take 1 tablet by mouth in the morning and at bedtime.   metoprolol succinate (TOPROL-XL) 50 MG 24 hr tablet Take 50 mg by mouth daily.   Semaglutide, 1 MG/DOSE, 4 MG/3ML SOPN Inject 1 mg as directed once a week.   sertraline (ZOLOFT) 50 MG tablet Take 1 tablet (50 mg total) by mouth daily.   sildenafil (VIAGRA) 100 MG tablet Take 0.5-1 tablets (50-100 mg total) by mouth daily as needed for erectile dysfunction.   sodium bicarbonate 650 MG tablet Take 650 mg by mouth 2 (two) times daily.   tiZANidine (ZANAFLEX) 4 MG tablet Take 1 tablet (4 mg total) by mouth at bedtime.   traMADol (ULTRAM) 50 MG tablet Take 1-2 tablets (50-100 mg total) by mouth 2 (two) times daily as needed for moderate pain (pain score 4-6).   Continuous Blood Gluc Sensor (FREESTYLE LIBRE 3 SENSOR) MISC Place 1 sensor on the skin every 14 days. Use to check  glucose continuously (Patient not taking: Reported on 04/30/2023)   glucose blood (ACCU-CHEK GUIDE) test strip Use as directed to test blood sugars BID (Patient not taking: Reported on 04/30/2023)   No facility-administered encounter medications on file as of 04/30/2023.    Review of Systems  Review of Systems  Constitutional: Negative.   HENT: Negative.    Cardiovascular: Negative.   Gastrointestinal: Negative.   Allergic/Immunologic: Negative.   Neurological: Negative.   Psychiatric/Behavioral: Negative.       Objective:   BP 136/81   Pulse 95   Temp (!) 97.2 F (36.2 C)   Wt 289 lb 9.6 oz  (131.4 kg)   SpO2 95%   BMI 42.77 kg/m   Wt Readings from Last 5 Encounters:  04/30/23 289 lb 9.6 oz (131.4 kg)  04/10/23 293 lb (132.9 kg)  03/01/23 299 lb (135.6 kg)  02/01/23 (!) 301 lb (136.5 kg)  01/29/23 (!) 303 lb (137.4 kg)     Physical Exam Vitals and nursing note reviewed.  Constitutional:      General: He is not in acute distress.    Appearance: He is well-developed.  Cardiovascular:     Rate and Rhythm: Normal rate and regular rhythm.  Pulmonary:     Effort: Pulmonary effort is normal.     Breath sounds: Normal breath sounds.  Skin:    General: Skin is warm and dry.  Neurological:     Mental Status: He is alert and oriented to person, place, and time.       Assessment & Plan:   Type 2 diabetes mellitus with stage 4 chronic kidney disease, without long-term current use of insulin (HCC) -     POCT glycosylated hemoglobin (Hb A1C)     Return in about 3 months (around 07/29/2023).   Ivonne Andrew, NP 04/30/2023

## 2023-04-30 NOTE — Patient Instructions (Addendum)
1. Type 2 diabetes mellitus with stage 4 chronic kidney disease, without long-term current use of insulin (HCC) (Primary)  - POCT glycosylated hemoglobin (Hb A1C)    Follow up:  Follow up in 3 months

## 2023-05-03 ENCOUNTER — Encounter: Payer: Self-pay | Admitting: Podiatry

## 2023-05-03 ENCOUNTER — Ambulatory Visit (INDEPENDENT_AMBULATORY_CARE_PROVIDER_SITE_OTHER): Payer: Medicaid Other | Admitting: Podiatry

## 2023-05-03 DIAGNOSIS — N184 Chronic kidney disease, stage 4 (severe): Secondary | ICD-10-CM

## 2023-05-03 DIAGNOSIS — E1122 Type 2 diabetes mellitus with diabetic chronic kidney disease: Secondary | ICD-10-CM

## 2023-05-03 DIAGNOSIS — B351 Tinea unguium: Secondary | ICD-10-CM

## 2023-05-03 DIAGNOSIS — M79675 Pain in left toe(s): Secondary | ICD-10-CM

## 2023-05-03 DIAGNOSIS — M79674 Pain in right toe(s): Secondary | ICD-10-CM

## 2023-05-03 NOTE — Progress Notes (Signed)
This patient returns to my office for at risk foot care.  This patient requires this care by a professional since this patient will be at risk due to having diabetes with kidney disease. This patient is unable to cut nails himself since the patient cannot reach his nails.These nails are painful walking and wearing shoes.  This patient presents for at risk foot care today.  General Appearance  Alert, conversant and in no acute stress.  Vascular  Dorsalis pedis and posterior tibial  pulses are palpable  bilaterally.  Capillary return is within normal limits  bilaterally. Temperature is within normal limits  bilaterally.  Neurologic  Senn-Weinstein monofilament wire test within normal limits  bilaterally. Muscle power within normal limits bilaterally.  Nails Thick disfigured discolored nails with subungual debris  from hallux to fifth toes bilaterally. No evidence of bacterial infection or drainage bilaterally.  Orthopedic  No limitations of motion  feet .  No crepitus or effusions noted.  No bony pathology or digital deformities noted.  Skin  normotropic skin with no porokeratosis noted bilaterally.  No signs of infections or ulcers noted.     Onychomycosis  Pain in right toes  Pain in left toes  Consent was obtained for treatment procedures.   Mechanical debridement of nails 1-5  bilaterally performed with a nail nipper.  Filed with dremel without incident. Told him to make an appointment with Dr.  Charlsie Merles for nail surgery consultation.   Return office visit    3 months                  Told patient to return for periodic foot care and evaluation due to potential at risk complications.   Helane Gunther DPM

## 2023-05-08 ENCOUNTER — Other Ambulatory Visit: Payer: Self-pay | Admitting: Physical Medicine & Rehabilitation

## 2023-05-29 ENCOUNTER — Other Ambulatory Visit: Payer: Self-pay

## 2023-05-31 ENCOUNTER — Ambulatory Visit: Payer: Medicaid Other | Admitting: Primary Care

## 2023-05-31 ENCOUNTER — Encounter: Payer: Self-pay | Admitting: Primary Care

## 2023-05-31 VITALS — BP 137/86 | HR 91 | Temp 97.4°F | Ht 69.0 in | Wt 282.6 lb

## 2023-05-31 DIAGNOSIS — G4730 Sleep apnea, unspecified: Secondary | ICD-10-CM | POA: Diagnosis not present

## 2023-05-31 NOTE — Progress Notes (Signed)
@Patient  ID: Kevin Todd, male    DOB: 06-23-1975, 48 y.o.   MRN: [id number]  Chief Complaint  Patient presents with   Follow-up    Referring provider: Ivonne Andrew, NP  HPI: 48 year old male, former smoker.  Past medical history significant HTN, spontaneous pneumothorax, type 2 diabetes, polycycstic kidney disease, obesity, tobacco abuse.  Previous [facility] pulmonary encounter:  History of snoring, history of witnessed apneas  Usually goes to bed between 11 and 12 Falls asleep easily 4-5 awakenings Final wake up time about 8 AM Does not feel rested when he wakes up in the morning Weight is up about 10 to 20 pounds  He works as a Administrator, [employer] about a pack of cigarettes a week  Admits to dryness of his mouth in the morning Occasional headaches No night sweats Memory is fair  Has difficulty sometimes driving long distances but has not had any problems with driving specifically  Parents do not snore as far as he can remember  No pets  History of hypertension, diabetes, history of kidney disease    11/28/2022 Patient presents today to review sleep study results.  Patient was seen for sleep consult due to snoring symptoms and witnessed apnea.  His sleep is extremely restless, he wakes up 4-5 times on average at night.  He does take medication that makes him have to use the restroom at night due to his kidney disease. HST on 10/25/22 showed very mild OSA, AHI 5.9/hour with SpO2 88%. He spent 0 minutes with oxygen level <88%.  We discussed treatment options including weight loss, oral appliance, CPAP therapy or referral to ENT for possible surgical options.  Patient typically is a side sleeper.  He is working on weight loss.  He elected to be referred for oral appliance.  He feels that this would be a better option than CPAP as he moves around a lot in his sleep.    Mild sleep apnea - HST 10/25/22 showed very mild obstructive sleep apnea, AHI 5.9 apneic events an hour with SpO2  low 88%. Reviewed treatment options including weight loss, side sleeping position, oral appliance, CPAP therapy referral to ENT for possible surgical options. Patient elected to be referred for oral appliance. He will notify our office if oral appliance is not an affordable option for him and he would like to proceed with CPAP.   Recommendations Work on weight loss efforts Do not drive experiencing excessive daytime sleepiness fatigue Avoid excessive alcohol use or sedatives prior to bedtime as these can worsen underlying sleep apnea  Order: Refer to orthodontics Dr. Althea Grimmer [phone]   Follow-up 6 months with [facility] NP or sooner   05/31/2023 Discussed the use of AI scribe software for clinical note transcription with the patient, who gave verbal consent to proceed.  History of Present Illness   The patient, diagnosed with very mild sleep apnea (approximately 5.9 apneic events per hour), was previously referred to Dr. Myrtis Ser for an oral appliance. However, he was unable to attend the appointment due to unspecified discomfort.  Despite the mild nature of his condition, the patient has elected to proceed with CPAP therapy due to reported daytime sleepiness.      Allergies  Allergen Reactions   Nsaids     Kidney Disease  Other Reaction(s): Other (See Comments)  Renal Disease    Immunization History  Administered Date(s) Administered   Moderna Sars-Covid-2 Vaccination 01/15/2020   PFIZER(Purple Top)SARS-COV-2 Vaccination 12/18/2019    Past Medical  History:  Diagnosis Date   Chronic kidney disease    Elevated creatine kinase 09/2019   Hypertension    Iliac crest spur of left hip    Insomnia 10/2019   Pneumothorax on right    Polycystic kidney disease    Vitamin D deficiency 09/2019    Tobacco History: Social History   Tobacco Use  Smoking Status Former   Average packs/day: 0.3 packs/day for 32.2 years (8.0 ttl pk-yrs)   Types: Cigarettes   Start date: 07/18/1990   Smokeless Tobacco Never  Tobacco Comments   4 cigarettes/pd recently quit 03/2021   Restarted smoking.  Quit smoking as of May 2024.  Updated 11/28/2022.  am   Counseling given: Not Answered Tobacco comments: 4 cigarettes/pd recently quit 03/2021 Restarted smoking.  Quit smoking as of May 2024.  Updated 11/28/2022.  am   Outpatient Medications Prior to Visit  Medication Sig Dispense Refill   amLODipine (NORVASC) 10 MG tablet Take 1 tablet (10 mg total) by mouth daily. 30 tablet 1   atorvastatin (LIPITOR) 80 MG tablet Take 1 tablet (80 mg total) by mouth daily. 90 tablet 3   Continuous Blood Gluc Sensor (FREESTYLE LIBRE 3 SENSOR) MISC Place 1 sensor on the skin every 14 days. Use to check glucose continuously 2 each 11   furosemide (LASIX) 40 MG tablet Take 40 mg by mouth daily.     gabapentin (NEURONTIN) 300 MG capsule Take 300 mg by mouth 3 (three) times daily.     glucose blood (ACCU-CHEK GUIDE) test strip Use as directed to test blood sugars BID 200 strip 5   hydrALAZINE (APRESOLINE) 100 MG tablet Take 100 mg by mouth 2 (two) times daily.     JYNARQUE 90 & 30 MG TBPK Take 1 tablet by mouth in the morning and at bedtime.     metoprolol succinate (TOPROL-XL) 50 MG 24 hr tablet Take 50 mg by mouth daily.     Semaglutide, 1 MG/DOSE, 4 MG/3ML SOPN Inject 1 mg as directed once a week. 3 mL 2   sertraline (ZOLOFT) 50 MG tablet TAKE 1 TABLET BY MOUTH EVERY DAY 30 tablet 1   sildenafil (VIAGRA) 100 MG tablet Take 0.5-1 tablets (50-100 mg total) by mouth daily as needed for erectile dysfunction. 5 tablet 11   sodium bicarbonate 650 MG tablet Take 650 mg by mouth 2 (two) times daily.     tiZANidine (ZANAFLEX) 4 MG tablet Take 1 tablet (4 mg total) by mouth at bedtime. 30 tablet 0   traMADol (ULTRAM) 50 MG tablet Take 1-2 tablets (50-100 mg total) by mouth 2 (two) times daily as needed for moderate pain (pain score 4-6). 120 tablet 0   No facility-administered medications prior to visit.    Review of Systems  Review of Systems  Constitutional:  Positive for fatigue.  HENT: Negative.    Respiratory: Negative.    Cardiovascular: Negative.   Psychiatric/Behavioral:  Positive for sleep disturbance.    Physical Exam  BP 137/86 (BP Location: Left Arm, Patient Position: Sitting, Cuff Size: Large)   Pulse 91   Temp (!) 97.4 F (36.3 C) (Temporal)   Ht 5\' 9"  (1.753 m)   Wt 282 lb 9.6 oz (128.2 kg)   SpO2 97%   BMI 41.73 kg/m  Physical Exam Constitutional:      Appearance: Normal appearance.  HENT:     Head: Normocephalic and atraumatic.  Cardiovascular:     Rate and Rhythm: Normal rate.  Pulmonary:  Effort: Pulmonary effort is normal.  Skin:    General: Skin is warm and dry.  Neurological:     General: No focal deficit present.     Mental Status: He is alert and oriented to person, place, and time. Mental status is at baseline.  Psychiatric:        Mood and Affect: Mood normal.        Behavior: Behavior normal.        Thought Content: Thought content normal.        Judgment: Judgment normal.      Lab Results:  CBC    Component Value Date/Time   WBC 8.5 01/29/2023 1046   WBC 6.9 12/11/2021 0801   RBC 4.08 (L) 01/29/2023 1046   RBC 3.69 (L) 12/11/2021 0801   HGB 12.6 (L) 01/29/2023 1046   HCT 38.0 01/29/2023 1046   PLT 267 01/29/2023 1046   MCV 93 01/29/2023 1046   MCH 30.9 01/29/2023 1046   MCH 31.7 12/11/2021 0801   MCHC 33.2 01/29/2023 1046   MCHC 34.6 12/11/2021 0801   RDW 13.2 01/29/2023 1046   LYMPHSABS 1.3 12/11/2021 0801   LYMPHSABS 1.6 09/29/2019 1402   MONOABS 0.4 12/11/2021 0801   EOSABS 0.1 12/11/2021 0801   EOSABS 0.2 09/29/2019 1402   BASOSABS 0.1 12/11/2021 0801   BASOSABS 0.1 09/29/2019 1402    BMET    Component Value Date/Time   NA 142 01/29/2023 1046   K 4.5 01/29/2023 1046   CL 109 (H) 01/29/2023 1046   CO2 18 (L) 01/29/2023 1046   GLUCOSE 96 01/29/2023 1046   GLUCOSE 253 (H) 12/13/2021 0413   BUN 22 01/29/2023  1046   CREATININE 3.66 (H) 01/29/2023 1046   CALCIUM 9.1 01/29/2023 1046   GFRNONAA 24 (L) 12/13/2021 0413   GFRAA 29 (L) 09/29/2019 1402    BNP No results found for: "BNP"  ProBNP No results found for: "PROBNP"  Imaging: No results found.   Assessment & Plan:   1. Mild sleep apnea (Primary) - Ambulatory Referral for DME     Sleep Apnea Patient was diagnosed with mild sleep apnea in June with 5.9 apneic events per hour. Patient initially considered oral appliance but did not follow up with ENT. Patient now elects to try CPAP therapy due to reported daytime sleepiness.  -Order CPAP machine auto settings 5-15cm h20 with mask of choice -Advise patient to use CPAP machine for a minimum of 4 hours per night, at least 70% of the time. -Schedule follow-up appointment in 8 weeks to assess CPAP usage and effectiveness.      Glenford Bayley, NP 05/31/2023

## 2023-05-31 NOTE — Patient Instructions (Signed)
-SLEEP APNEA: Sleep apnea is a condition where breathing repeatedly stops and starts during sleep. You have a mild form of this condition, with 5.9 apneic events per hour. We discussed that you will start using a CPAP machine, which helps keep your airways open while you sleep. You should use the CPAP machine for at least 4 hours per night, at least 70% of the time. We will have a follow-up appointment in 8 weeks to see how you are doing with the CPAP therapy.  INSTRUCTIONS:  Please use your CPAP machine for a minimum of 4 hours per night, at least 70% of the time. We will schedule a follow-up appointment in 8 weeks to assess your CPAP usage and effectiveness.  Follow-up 8-12 weeks with [facility] NP  CPAP and BIPAP Information CPAP and BIPAP are methods that use air pressure to keep your airways open and to help you breathe well. CPAP and BIPAP use different amounts of pressure. Your health care provider will tell you whether CPAP or BIPAP would be more helpful for you. CPAP stands for "continuous positive airway pressure." With CPAP, the amount of pressure stays the same while you breathe in (inhale) and out (exhale). BIPAP stands for "bi-level positive airway pressure." With BIPAP, the amount of pressure will be higher when you inhale and lower when you exhale. This allows you to take larger breaths. CPAP or BIPAP may be used in the hospital, or your health care provider may want you to use it at home. You may need to have a sleep study before your health care provider can order a machine for you to use at home. What are the advantages? CPAP or BIPAP can be helpful if you have: Sleep apnea. Chronic obstructive pulmonary disease (COPD). Heart failure. Medical conditions that cause muscle weakness, including muscular dystrophy or amyotrophic lateral sclerosis (ALS). Other problems that cause breathing to be shallow, weak, abnormal, or difficult. CPAP and BIPAP are most commonly used for  obstructive sleep apnea (OSA) to keep the airways from collapsing when the muscles relax during sleep. What are the risks? Generally, this is a safe treatment. However, problems may occur, including: Irritated skin or skin sores if the mask does not fit properly. Dry or stuffy nose or nosebleeds. Dry mouth. Feeling gassy or bloated. Sinus or lung infection if the equipment is not cleaned properly. When should CPAP or BIPAP be used? In most cases, the mask only needs to be worn during sleep. Generally, the mask needs to be worn throughout the night and during any daytime naps. People with certain medical conditions may also need to wear the mask at other times, such as when they are awake. Follow instructions from your health care provider about when to use the machine. What happens during CPAP or BIPAP?  Both CPAP and BIPAP are provided by a small machine with a flexible plastic tube that attaches to a plastic mask that you wear. Air is blown through the mask into your nose or mouth. The amount of pressure that is used to blow the air can be adjusted on the machine. Your health care provider will set the pressure setting and help you find the best mask for you. Tips for using the mask Because the mask needs to be snug, some people feel trapped or closed-in (claustrophobic) when first using the mask. If you feel this way, you may need to get used to the mask. One way to do this is to hold the mask loosely over  your nose or mouth and then gradually apply the mask more snugly. You can also gradually increase the amount of time that you use the mask. Masks are available in various types and sizes. If your mask does not fit well, talk with your health care provider about getting a different one. Some common types of masks include: Full face masks, which fit over the mouth and nose. Nasal masks, which fit over the nose. Nasal pillow or prong masks, which fit into the nostrils. If you are using a mask  that fits over your nose and you tend to breathe through your mouth, a chin strap may be applied to help keep your mouth closed. Use a skin barrier to protect your skin as told by your health care provider. Some CPAP and BIPAP machines have alarms that may sound if the mask comes off or develops a leak. If you have trouble with the mask, it is very important that you talk with your health care provider about finding a way to make the mask easier to tolerate. Do not stop using the mask. There could be a negative impact on your health if you stop using the mask. Tips for using the machine Place your CPAP or BIPAP machine on a secure table or stand near an electrical outlet. Know where the on/off switch is on the machine. Follow instructions from your health care provider about how to set the pressure on your machine and when you should use it. Do not eat or drink while the CPAP or BIPAP machine is on. Food or fluids could get pushed into your lungs by the pressure of the CPAP or BIPAP. For home use, CPAP and BIPAP machines can be rented or purchased through home health care companies. Many different brands of machines are available. Renting a machine before purchasing may help you find out which particular machine works well for you. Your health insurance company may also decide which machine you may get. Keep the CPAP or BIPAP machine and attachments clean. Ask your health care provider for specific instructions. Check the humidifier if you have a dry stuffy nose or nosebleeds. Make sure it is working correctly. Follow these instructions at home: Take over-the-counter and prescription medicines only as told by your health care provider. Ask if you can take sinus medicine if your sinuses are blocked. Do not use any products that contain nicotine or tobacco. These products include cigarettes, chewing tobacco, and vaping devices, such as e-cigarettes. If you need help quitting, ask your health care  provider. Keep all follow-up visits. This is important. Contact a health care provider if: You have redness or pressure sores on your head, face, mouth, or nose from the mask or head gear. You have trouble using the CPAP or BIPAP machine. You cannot tolerate wearing the CPAP or BIPAP mask. Someone tells you that you snore even when wearing your CPAP or BIPAP. Get help right away if: You have trouble breathing. You feel confused. Summary CPAP and BIPAP are methods that use air pressure to keep your airways open and to help you breathe well. If you have trouble with the mask, it is very important that you talk with your health care provider about finding a way to make the mask easier to tolerate. Do not stop using the mask. There could be a negative impact to your health if you stop using the mask. Follow instructions from your health care provider about when to use the machine. This information is not intended  to replace advice given to you by your health care provider. Make sure you discuss any questions you have with your health care provider. Document Revised: 12/08/2020 Document Reviewed: 04/09/2020 Elsevier Patient Education  2023 [employer].

## 2023-06-04 ENCOUNTER — Encounter: Payer: Medicaid Other | Admitting: Physical Medicine & Rehabilitation

## 2023-06-12 ENCOUNTER — Other Ambulatory Visit: Payer: Self-pay | Admitting: Pharmacist

## 2023-06-14 ENCOUNTER — Encounter: Payer: Self-pay | Admitting: Physical Medicine & Rehabilitation

## 2023-06-14 ENCOUNTER — Encounter: Payer: Medicaid Other | Attending: Physical Medicine & Rehabilitation | Admitting: Physical Medicine & Rehabilitation

## 2023-06-14 VITALS — BP 133/84 | HR 80 | Ht 69.0 in | Wt 279.0 lb

## 2023-06-14 DIAGNOSIS — G8929 Other chronic pain: Secondary | ICD-10-CM | POA: Insufficient documentation

## 2023-06-14 DIAGNOSIS — Z87891 Personal history of nicotine dependence: Secondary | ICD-10-CM | POA: Diagnosis not present

## 2023-06-14 DIAGNOSIS — Q613 Polycystic kidney, unspecified: Secondary | ICD-10-CM | POA: Insufficient documentation

## 2023-06-14 DIAGNOSIS — I129 Hypertensive chronic kidney disease with stage 1 through stage 4 chronic kidney disease, or unspecified chronic kidney disease: Secondary | ICD-10-CM | POA: Diagnosis not present

## 2023-06-14 DIAGNOSIS — N184 Chronic kidney disease, stage 4 (severe): Secondary | ICD-10-CM | POA: Insufficient documentation

## 2023-06-14 DIAGNOSIS — G894 Chronic pain syndrome: Secondary | ICD-10-CM | POA: Diagnosis present

## 2023-06-14 DIAGNOSIS — M545 Low back pain, unspecified: Secondary | ICD-10-CM | POA: Insufficient documentation

## 2023-06-14 DIAGNOSIS — M25552 Pain in left hip: Secondary | ICD-10-CM | POA: Diagnosis present

## 2023-06-14 DIAGNOSIS — Z79891 Long term (current) use of opiate analgesic: Secondary | ICD-10-CM | POA: Diagnosis not present

## 2023-06-14 DIAGNOSIS — M25551 Pain in right hip: Secondary | ICD-10-CM | POA: Diagnosis present

## 2023-06-14 DIAGNOSIS — Z5181 Encounter for therapeutic drug level monitoring: Secondary | ICD-10-CM | POA: Diagnosis present

## 2023-06-14 DIAGNOSIS — Z6841 Body Mass Index (BMI) 40.0 and over, adult: Secondary | ICD-10-CM | POA: Insufficient documentation

## 2023-06-14 DIAGNOSIS — Z79899 Other long term (current) drug therapy: Secondary | ICD-10-CM | POA: Insufficient documentation

## 2023-06-14 DIAGNOSIS — E669 Obesity, unspecified: Secondary | ICD-10-CM | POA: Insufficient documentation

## 2023-06-14 MED ORDER — TRAMADOL HCL 50 MG PO TABS: 50.0000 mg | ORAL_TABLET | Freq: Two times a day (BID) | ORAL | 0 refills | Status: DC | PRN

## 2023-06-14 NOTE — Progress Notes (Signed)
Subjective:    Patient ID: Kevin Todd, male    DOB: February 27, 1976, 48 y.o.   MRN: [id number]  HPI Nerve block a couple of weeks ago. Done in [place]   HPI 02/01/2023  Kevin Todd is a 48 y.o. year old male  who  has a past medical history of Chronic kidney disease, Elevated creatine kinase (09/2019), Hypertension, Iliac crest spur of left hip, Insomnia (10/2019), Pneumothorax on right, Polycystic kidney disease, and Vitamin D deficiency (09/2019).   They are presenting to PM&R clinic as a new patient for pain management evaluation. They were referred by Angus Seller for treatment of hip pain.  Patient reports he has been having severe hip pain worsening for about 5 to 6 years.  He reports both of his hips hurt however pain is the worst in his left hip.  He also has less severe pain in his lower back.  Pain is worsened with any kind of activity.  He says he cannot sleep due to the pain.  He also cannot sit for too long without the pain worsening.  He often has to change positions.  Walking severely worsens his pain.  He has been following with [facility] and has been given gabapentin.  He is also received IA injections for his bilateral hips without significant benefit reported.  Patient reports he would like to improve his activities so that he could potentially work one day.  He will occasionally have tingling along his left thigh.  Patient reports that orthopedics told him he would have to lose weight before he could be considered for hip replacement.  Patient reports his mood is significantly decreased and would like medication to help this.  He denies SI or HI.   Red flag symptoms: No red flags for back pain endorsed in Hx or ROS  Medications tried: Topical medications denies  Nsaids kidney disease Tylenol  minimal benefit Opiates Tramdol- waiting on insurance to approve  Gabapentin - Helps a little  TCAs - Denies  SNRIs - Denies    Other treatments: PT/OT  Therapy heped a  little TENs unit - Denies Injections  Hip cortisone did not help, appears these were intra-articular injections bilaterally Surgery  Denies   Goals for pain control: Improve his pain so he can be more active     03/01/23 Interval history Patient is here for follow-up regarding his chronic hip pain.  Pain largely unchanged since last visit.  Patient did try tramadol and oxycodone previously ordered by PCP.  He reports the tramadol decreased this pain however the Percocet provided better relief.  Patient reports he only used a few tabs of the Percocet medication since he picked it up a few weeks ago.  He would like to avoid stronger narcotic medications if possible.  Discussed UDS with 83 ng/mL carboxy THC noted.  Patient reported previously that he had been using a edible CBD product.  He reports he is no longer using this product as he was told not to do this last visit.  Patient says he will no longer use this product.  We discussed that on UDS I cannot distinguish these products from marijuana.  04/10/23 Interval History  Patient is here for follow-up regarding his chronic pain.  Patient reports that tramadol continues to help his pain however he has some days where he feels like it is not strong enough to keep his pain under control.  His prior Percocet 5 mg tabs were better when pain is  particularly severe.  He is not having any side effects with this medication.  TENS unit provides mild relief.  Patient reports he could not have hip nerve ablation due to insurance issue however he says [facility] is still working on this possibility.  06/14/23 Interval History  Patient is here for follow-up regarding treatment of his bilateral hip pain left greater than right.  Left hip continues to be the greatest location of his pain.  He also has lower back pain.  Patient reports he had procedure with [facility] that helped reduce his pain about 50% for about a week.  Patient reports  that tramadol helps reduce his pain but sometimes is not strong enough.  He has not tried using 100 mg dose of tramadol and uses his medications sparingly.  Prior UDS results:     Component Value Date/Time   LABOPIA NONE DETECTED 02/28/2016 0315   COCAINSCRNUR NONE DETECTED 02/28/2016 0315   LABBENZ NONE DETECTED 02/28/2016 0315   AMPHETMU NONE DETECTED 02/28/2016 0315   THCU POSITIVE (A) 02/28/2016 0315   LABBARB NONE DETECTED 02/28/2016 0315      Pain Inventory Average Pain 6 Pain Right Now 5 My pain is sharp, stabbing, and aching  In the last 24 hours, has pain interfered with the following? General activity 5 Relation with others 0 Enjoyment of life 0 What TIME of day is your pain at its worst? daytime and night Sleep (in general) Poor  Pain is worse with: walking, sitting, standing, and some activites Pain improves with: pacing activities and medication Relief from Meds: 5      Family History  Problem Relation Age of Onset   Hypertension Mother    Colon cancer Maternal Grandfather    Stomach cancer Neg Hx    Esophageal cancer Neg Hx    Pancreatic cancer Neg Hx    Social History   Socioeconomic History   Marital status: Single    Spouse name: Not on file   Number of children: Not on file   Years of education: Not on file   Highest education level: 12th grade  Occupational History   Not on file  Tobacco Use   Smoking status: Former    Average packs/day: 0.3 packs/day for 32.2 years (8.0 ttl pk-yrs)    Types: Cigarettes    Start date: 07/18/1990   Smokeless tobacco: Never   Tobacco comments:    4 cigarettes/pd recently quit 03/2021    Restarted smoking.  Quit smoking as of May 2024.  Updated 11/28/2022.  am  Vaping Use   Vaping status: Never Used  Substance and Sexual Activity   Alcohol use: Not Currently   Drug use: No   Sexual activity: Yes  Other Topics Concern   Not on file  Social History Narrative   Not on file   Social Drivers of Health    Financial Resource Strain: High Risk (04/30/2023)   Overall Financial Resource Strain (CARDIA)    Difficulty of Paying Living Expenses: Hard  Food Insecurity: Food Insecurity Present (04/30/2023)   Hunger Vital Sign    Worried About Running Out of Food in the Last Year: Patient declined    Ran Out of Food in the Last Year: Sometimes true  Transportation Needs: No Transportation Needs (04/30/2023)   PRAPARE - Administrator, Civil Service (Medical): No    Lack of Transportation (Non-Medical): No  Physical Activity: Unknown (04/30/2023)   Exercise Vital Sign    Days of  Exercise per Week: Patient declined    Minutes of Exercise per Session: 10 min  Stress: Stress Concern Present (04/30/2023)   [employer] of Occupational Health - Occupational Stress Questionnaire    Feeling of Stress : Very much  Social Connections: Socially Isolated (04/30/2023)   Social Connection and Isolation Panel [NHANES]    Frequency of Communication with Friends and Family: Three times a week    Frequency of Social Gatherings with Friends and Family: Twice a week    Attends Religious Services: Never    Database administrator or Organizations: No    Attends Engineer, structural: Not on file    Marital Status: Never married   Past Surgical History:  Procedure Laterality Date   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 Right 04/14/2021   Procedure: Achilles tendon debridement and reconstruction, excision of Haglund;  Surgeon: Toni Arthurs, MD;  Location: [facility];  Service: Orthopedics;  Laterality: Right;   GASTROCNEMIUS RECESSION Right 04/14/2021   Procedure: Right gastroc recession;  Surgeon: Toni Arthurs, MD;  Location: [facility];  Service: Orthopedics;  Laterality: Right;   Past Medical History:  Diagnosis Date   Chronic kidney disease    Elevated creatine kinase 09/2019   Hypertension    Iliac crest spur of left hip    Insomnia  10/2019   Pneumothorax on right    Polycystic kidney disease    Vitamin D deficiency 09/2019   BP 133/84   Pulse 80   Ht 5\' 9"  (1.753 m)   Wt 279 lb (126.6 kg)   SpO2 98%   BMI 41.20 kg/m   Opioid Risk Score:   Fall Risk Score:  `1  Depression screen [facility] 2/9     03/01/2023   10:17 AM 02/01/2023   11:13 AM 10/27/2022    9:55 AM 07/21/2022   10:16 AM 12/28/2021    1:52 PM 09/29/2019    1:17 PM  Depression screen PHQ 2/9  Decreased Interest 0 3 0 0 0 0  Down, Depressed, Hopeless 0 2 1 0 0 0  PHQ - 2 Score 0 5 1 0 0 0  Altered sleeping  3  3    Tired, decreased energy  3      Change in appetite  3  0    Feeling bad or failure about yourself   2  0    Trouble concentrating  1  0    Moving slowly or fidgety/restless  2  2    Suicidal thoughts  1  0    PHQ-9 Score  20  5    Difficult doing work/chores  Extremely dIfficult  Not difficult at all        Review of Systems  Musculoskeletal:        L hip pain  All other systems reviewed and are negative.      Objective:   Physical Exam   Gen: no distress, normal appearing HEENT: oral mucosa pink and moist, NCAT Chest: normal effort, normal rate of breathing Abd: soft, non-distended Ext: no edema Psych: pleasant, normal affect Skin: intact Neuro:  RUE: 5/5 Deltoid, 5/5 Biceps, 5/5 Triceps, 5/5 Wrist Ext, 5/5 Grip LUE: 5/5 Deltoid, 5/5 Biceps, 5/5 Triceps, 5/5 Wrist Ext, 5/5 Grip RLE: HF 4+/5, KE 4+/5,  ADF 5/5, APF 5/5 LLE: HF 4+/5, KE 4+/5,  ADF 5/5, APF 5/5 -Hip and knee strength appears to be effort/pain limited Exam pain limited Sensory exam normal for light  touch and pain in all 4 limbs. No limb ataxia or cerebellar signs. No abnormal tone appreciated.   Musculoskeletal:  Slight tender to palpation b/l Greater trochanter Mild TTP L spine paraspinal tenderness Slump test negative bilaterally Hip pain with internal and external rotation left greater than right    Excerpt from [facility] note on 05/18/2022 I  did review his x-rays from May 2022 and August of 2023 (left hip). Mild degenerative changes of the left hip. No other acute osseous abnormalities.     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  Denies SI or HI 5) Left thigh tingling 6) Obesity There is no height or weight on file to calculate BMI.  Plan  1) Continue gabapentin current dose as it is reported to be providing some benefit 2) continue tramadol 50 - 100mg  twice daily, Percocet was discontinued previously 3) UDS last visit with carboxy THC low level-this could be consistent with his report of using a locally purchased CBD compound previously.  Patient reports he has not been using this or marijuana anytime recently. 4) Nerve ablation [facility] [place] -patient reports 50% improvement with procedure.  It appears this was left lateral femoral and obturator articular nerve block.  Patient disappointed that this did not last longer however discussed that this was more diagnostic block.  Advised that he should contact the clinic as he may be able to do RFA which would last longer. 5) Order Tens unit for pain Zynex not covered, ordered from [employer]- Mild benefit  6) Provided list of foods that can be helpful for pain prior visit 7) Pt on Zoloft 50 mg daily, could consider duloxetine however caution due to CKD 8) Recommend weight loss 9) Continue PDMP monitoring, random UDS, Pill counts 10) Patient forgot pill count prior visit.  Consistent today.  It appears he uses tramadol sparingly 11) Recheck UDS today-he reports he is not using any marijuana or CBD products 1/30

## 2023-06-19 ENCOUNTER — Other Ambulatory Visit: Payer: Self-pay

## 2023-06-19 NOTE — Progress Notes (Signed)
 06/19/2023 Name: Kevin Todd MRN: [id number] DOB: [birth year]  Chief Complaint  Patient presents with   Diabetes   Weight Loss    Kevin Todd is a 48 y.o. year old male who presented for a telephone visit.   They were referred to the pharmacist by their PCP for assistance in managing diabetes. PMH includes T2DM, polycystic kidney disease (followed by [facility] for pending transplant, not on dialysis yet), HTN, obesity.    Subjective:  Care Team: Primary Care Provider: Oley Bascom RAMAN, NP ; Next Scheduled Visit: 07/30/23  Medication Access/Adherence  Current Pharmacy:  [id number] [address], [place] - [address] AT [facility] [address] [place] [address] Phone: [phone] Fax: [phone]  [facility] [address], [address] [place] [address] Phone: [phone] Fax: [phone]   Patient reports affordability concerns with their medications: No  Patient reports access/transportation concerns to their pharmacy: No  Patient reports adherence concerns with their medications:  No     Diabetes:  Current medications: Ozempic  1 mg weekly Medications tried in the past: Trulicity    Endorses on and off nausea since switching to the 1 mg dose. Notes that portion size has also decreased more.   Baseline weight: 299 lbs Most recent weight: 279 lbs Weight loss thus far: 6.7%  Patient denies hypoglycemic s/sx including dizziness, shakiness, sweating. Patient denies hyperglycemic symptoms including polyuria, polydipsia, polyphagia, nocturia, neuropathy, blurred vision.  Current meal patterns: Eats 2.5 meals per day.  - Breakfast: generally skipping;  - Lunch/Supper: chicken, vegetables, small serving sizes of carbohydrates - Snacks: fruits - grapes, oranges, strawberries;  - Drinks: water, sometimes gatorade or half and half lemonade   Current physical activity: limited by  OA pain, but has been doing some upper body muscle strengthening exercise   Hypertension:  Current medications: amlodipine  10 mg dialy, hydralazine  100 mg twice daily, metoprolol  succinate 50 mg daily  Patient has a validated, automated, upper arm home BP cuff Current blood pressure readings readings: Over last couple days 130s/80s, SBP always <140 mmHg. Endorses that he was resting before checking.   Patient denies hypotensive s/sx including dizziness, lightheadedness.  Patient denies hypertensive symptoms including headache, chest pain, shortness of breath  Patient was recently seen by cardiology at [facility] for follow-up of abnormal stress test. Appears they are planning for [facility] in the next couple weeks.   Hyperlipidemia/ASCVD Risk Reduction  Current lipid lowering medications: atorvastatin  80 mg daily  Clinical ASCVD: No  The 10-year ASCVD risk score (Arnett DK, et al., 2019) is: 18.6%   Values used to calculate the score:     Age: 11 years     Sex: Male     Is Non-Hispanic African American: Yes     Diabetic: Yes     Tobacco smoker: No     Systolic Blood Pressure: 148 mmHg     Is BP treated: Yes     HDL Cholesterol: 35 mg/dL     Total Cholesterol: 156 mg/dL     Objective:  Lab Results  Component Value Date   HGBA1C 5.7 (A) 04/30/2023    Lab Results  Component Value Date   CREATININE 3.66 (H) 01/29/2023   BUN 22 01/29/2023   NA 142 01/29/2023   K 4.5 01/29/2023   CL 109 (H) 01/29/2023   CO2 18 (L) 01/29/2023    Lab Results  Component Value Date   CHOL 159  01/29/2023   HDL 38 (L) 01/29/2023   LDLCALC 93 01/29/2023   TRIG 159 (H) 01/29/2023   CHOLHDL 4.2 01/29/2023    Medications Reviewed Today     Reviewed by Brinda Lorain SQUIBB, RPH (Pharmacist) on 06/19/23 at 1545  Med List Status: <None>   Medication Order Taking? Sig Documenting Provider Last Dose Status Informant  amLODipine  (NORVASC ) 10 MG tablet [id number] Yes Take 1 tablet (10 mg total) by mouth  daily. Sherrill Cable Selma, DO Taking Active Self  atorvastatin  (LIPITOR) 80 MG tablet [id number] Yes Take 1 tablet (80 mg total) by mouth daily. Oley Bascom RAMAN, NP Taking Active   Continuous Blood Gluc Sensor (FREESTYLE LIBRE 3 SENSOR) [place] [id number]  Place 1 sensor on the skin every 14 days. Use to check glucose continuously Oley Bascom RAMAN, NP  Active   furosemide (LASIX) 40 MG tablet [id number]  Take 40 mg by mouth daily. [provider]  Active   gabapentin  (NEURONTIN ) 300 MG capsule [id number]  Take 300 mg by mouth 3 (three) times daily. [provider]  Active   glucose blood (ACCU-CHEK GUIDE) test strip [id number]  Use as directed to test blood sugars BID Nichols, Tonya S, NP  Active   hydrALAZINE  (APRESOLINE ) 100 MG tablet [id number] Yes Take 100 mg by mouth 2 (two) times daily. [provider] Taking Active   JYNARQUE  90 & 30 MG TBPK [id number] Yes Take 1 tablet by mouth in the morning and at bedtime. [provider] Taking Active Self  metoprolol  succinate (TOPROL -XL) 50 MG 24 hr tablet [id number] Yes Take 50 mg by mouth daily. [provider] Taking Active Self  Semaglutide , 1 MG/DOSE, 4 MG/3ML SOPN [id number] Yes Inject 1 mg as directed once a week. Oley Bascom RAMAN, NP Taking Active   sertraline  (ZOLOFT ) 50 MG tablet [id number]  TAKE 1 TABLET BY MOUTH EVERY DAY Urbano Albright, MD  Active   sildenafil  (VIAGRA ) 100 MG tablet [id number]  Take 0.5-1 tablets (50-100 mg total) by mouth daily as needed for erectile dysfunction. Oley Bascom RAMAN, NP  Active   sodium bicarbonate  650 MG tablet [id number]  Take 650 mg by mouth 2 (two) times daily. [provider]  Active Self  tiZANidine  (ZANAFLEX ) 4 MG tablet [id number]  Take 1 tablet (4 mg total) by mouth at bedtime. Oley Bascom RAMAN, NP  Active   traMADol  (ULTRAM ) 50 MG tablet [id number]  Take 1-2 tablets (50-100 mg total) by mouth 2 (two) times daily as needed for moderate pain (pain score 4-6).  Urbano Albright, MD  Active               Assessment/Plan:   Diabetes: - Currently controlled. Patient tolerating Ozempic  1 mg well. Given ongoing nausea, will hold off on increasing to 2 mg. Patient has continued to lose weight, while maintaining appetite.  - Reviewed dietary modifications including: incorporating protein (moderate portions) into meals. Consider protein shakes or other light sources of protein for breakfast to help reduce queasiness with an empty stomach - Reviewed lifestyle modifications including: physical activity as able - Recommend to continue Ozempic  to 1 mg weekly.    Hypertension: - Currently controlled per home readings - Reviewed long term cardiovascular and renal outcomes of uncontrolled blood pressure - Reviewed appropriate blood pressure monitoring technique and reviewed goal blood pressure. Recommended to check home blood pressure and heart rate periodically and report at future visits - Recommend to continue current regimen at this time  Hyperlipidemia/ASCVD Risk Reduction: - Currently uncontrolled, goal <  70 - Recommend to continue current regimen at this time, check lipids with upcomign appointment   Follow Up Plan: pharmacist follow-up as needed  Lorain Baseman, PharmD PGY1 Pharmacy Resident

## 2023-07-05 LAB — LAB REPORT - SCANNED
EGFR: 20
PTH: 104

## 2023-07-09 LAB — HM DIABETES EYE EXAM

## 2023-07-24 ENCOUNTER — Other Ambulatory Visit: Payer: Self-pay | Admitting: Physical Medicine & Rehabilitation

## 2023-07-24 NOTE — Telephone Encounter (Signed)
 Dr. Natale Lay is off this week. Please advise or prescribe. Thank you.

## 2023-07-30 ENCOUNTER — Ambulatory Visit: Payer: Self-pay | Admitting: Nurse Practitioner

## 2023-08-09 ENCOUNTER — Ambulatory Visit (INDEPENDENT_AMBULATORY_CARE_PROVIDER_SITE_OTHER): Payer: Medicaid Other | Admitting: Podiatry

## 2023-08-09 ENCOUNTER — Encounter: Payer: Self-pay | Admitting: Podiatry

## 2023-08-09 DIAGNOSIS — E1122 Type 2 diabetes mellitus with diabetic chronic kidney disease: Secondary | ICD-10-CM | POA: Diagnosis not present

## 2023-08-09 DIAGNOSIS — M79674 Pain in right toe(s): Secondary | ICD-10-CM

## 2023-08-09 DIAGNOSIS — B351 Tinea unguium: Secondary | ICD-10-CM | POA: Diagnosis not present

## 2023-08-09 DIAGNOSIS — M79675 Pain in left toe(s): Secondary | ICD-10-CM | POA: Diagnosis not present

## 2023-08-09 DIAGNOSIS — N184 Chronic kidney disease, stage 4 (severe): Secondary | ICD-10-CM

## 2023-08-09 NOTE — Progress Notes (Signed)
This patient returns to my office for at risk foot care.  This patient requires this care by a professional since this patient will be at risk due to having diabetes with kidney disease.  This patient is unable to cut nails himself since the patient cannot reach his nails.These nails are painful walking and wearing shoes.  This patient presents for at risk foot care today.  General Appearance  Alert, conversant and in no acute stress.  Vascular  Dorsalis pedis and posterior tibial  pulses are palpable  bilaterally.  Capillary return is within normal limits  bilaterally. Temperature is within normal limits  bilaterally.  Neurologic  Senn-Weinstein monofilament wire test within normal limits  bilaterally. Muscle power within normal limits bilaterally.  Nails Thick disfigured discolored nails with subungual debris  from hallux to fifth toes bilaterally. No evidence of bacterial infection or drainage bilaterally.  Orthopedic  No limitations of motion  feet .  No crepitus or effusions noted.  No bony pathology or digital deformities noted.  Skin  normotropic skin with no porokeratosis noted bilaterally.  No signs of infections or ulcers noted.     Onychomycosis  Pain in right toes  Pain in left toes  Consent was obtained for treatment procedures.   Mechanical debridement of nails 1-5  bilaterally performed with a nail nipper.  Filed with dremel without incident.    Return office visit    3 months                  Told patient to return for periodic foot care and evaluation due to potential at risk complications.   Helane Gunther DPM

## 2023-08-13 ENCOUNTER — Encounter: Payer: Medicaid Other | Attending: Physical Medicine & Rehabilitation | Admitting: Physical Medicine & Rehabilitation

## 2023-08-13 ENCOUNTER — Encounter: Payer: Self-pay | Admitting: Physical Medicine & Rehabilitation

## 2023-08-13 VITALS — BP 149/81 | HR 83 | Ht 69.0 in | Wt 276.0 lb

## 2023-08-13 DIAGNOSIS — Z79891 Long term (current) use of opiate analgesic: Secondary | ICD-10-CM | POA: Diagnosis not present

## 2023-08-13 DIAGNOSIS — G894 Chronic pain syndrome: Secondary | ICD-10-CM | POA: Diagnosis present

## 2023-08-13 DIAGNOSIS — Z5181 Encounter for therapeutic drug level monitoring: Secondary | ICD-10-CM

## 2023-08-13 MED ORDER — TRAMADOL HCL 50 MG PO TABS
50.0000 mg | ORAL_TABLET | Freq: Two times a day (BID) | ORAL | 0 refills | Status: DC | PRN
Start: 2023-08-13 — End: 2023-08-13

## 2023-08-13 MED ORDER — SERTRALINE HCL 100 MG PO TABS: 100.0000 mg | ORAL_TABLET | Freq: Every day | ORAL | 4 refills | Status: AC

## 2023-08-13 MED ORDER — TRAMADOL HCL 50 MG PO TABS: 50.0000 mg | ORAL_TABLET | Freq: Two times a day (BID) | ORAL | 0 refills | Status: DC | PRN

## 2023-08-13 MED ORDER — SERTRALINE HCL 50 MG PO TABS: 50.0000 mg | ORAL_TABLET | Freq: Every day | ORAL | 6 refills | Status: DC

## 2023-08-13 NOTE — Progress Notes (Signed)
 Subjective:    Patient ID: Kevin Todd, male    DOB: 1975-05-21, 48 y.o.   MRN: [id number]  HPI Nerve block a couple of weeks ago. Done in [place]   HPI 02/01/2023  Kevin Todd is a 48 y.o. year old male  who  has a past medical history of Chronic kidney disease, Elevated creatine kinase (09/2019), Hypertension, Iliac crest spur of left hip, Insomnia (10/2019), Pneumothorax on right, Polycystic kidney disease, and Vitamin D deficiency (09/2019).   They are presenting to PM&R clinic as a new patient for pain management evaluation. They were referred by Kevin Todd for treatment of hip pain.  Patient reports he has been having severe hip pain worsening for about 5 to 6 years.  He reports both of his hips hurt however pain is the worst in his left hip.  He also has less severe pain in his lower back.  Pain is worsened with any kind of activity.  He says he cannot sleep due to the pain.  He also cannot sit for too long without the pain worsening.  He often has to change positions.  Walking severely worsens his pain.  He has been following with [facility] and has been given gabapentin.  He is also received IA injections for his bilateral hips without significant benefit reported.  Patient reports he would like to improve his activities so that he could potentially work one day.  He will occasionally have tingling along his left thigh.  Patient reports that orthopedics told him he would have to lose weight before he could be considered for hip replacement.  Patient reports his mood is significantly decreased and would like medication to help this.  He denies SI or HI.   Red flag symptoms: No red flags for back pain endorsed in Hx or ROS  Medications tried: Topical medications denies  Nsaids kidney disease Tylenol  minimal benefit Opiates Tramdol- waiting on insurance to approve  Gabapentin - Helps a little  TCAs - Denies  SNRIs - Denies    Other treatments: PT/OT  Therapy heped a  little TENs unit - Denies Injections  Hip cortisone did not help, appears these were intra-articular injections bilaterally Surgery  Denies   Goals for pain control: Improve his pain so he can be more active     03/01/23 Interval history Patient is here for follow-up regarding his chronic hip pain.  Pain largely unchanged since last visit.  Patient did try tramadol and oxycodone previously ordered by PCP.  He reports the tramadol decreased this pain however the Percocet provided better relief.  Patient reports he only used a few tabs of the Percocet medication since he picked it up a few weeks ago.  He would like to avoid stronger narcotic medications if possible.  Discussed UDS with 83 ng/mL carboxy THC noted.  Patient reported previously that he had been using a edible CBD product.  He reports he is no longer using this product as he was told not to do this last visit.  Patient says he will no longer use this product.  We discussed that on UDS I cannot distinguish these products from marijuana.  04/10/23 Interval History  Patient is here for follow-up regarding his chronic pain.  Patient reports that tramadol continues to help his pain however he has some days where he feels like it is not strong enough to keep his pain under control.  His prior Percocet 5 mg tabs were better when pain is  particularly severe.  He is not having any side effects with this medication.  TENS unit provides mild relief.  Patient reports he could not have hip nerve ablation due to insurance issue however he says [facility] is still working on this possibility.  06/14/23 Interval History  Patient is here for follow-up regarding treatment of his bilateral hip pain left greater than right.  Left hip continues to be the greatest location of his pain.  He also has lower back pain.  Patient reports he had procedure with [facility] that helped reduce his pain about 50% for about a week.  Patient reports  that tramadol helps reduce his pain but sometimes is not strong enough.  He has not tried using 100 mg dose of tramadol and uses his medications sparingly.  Reports waiting to hear from [facility]    08/13/23 Interval History  Continued b/l hip pain L>R. He says he has not heard from [facility]. Tramadol helping reduce his pain.  Reports mood has been decreased and has increased anxiety.    Prior UDS results:     Component Value Date/Time   LABOPIA NONE DETECTED 02/28/2016 0315   COCAINSCRNUR NONE DETECTED 02/28/2016 0315   LABBENZ NONE DETECTED 02/28/2016 0315   AMPHETMU NONE DETECTED 02/28/2016 0315   THCU POSITIVE (A) 02/28/2016 0315   LABBARB NONE DETECTED 02/28/2016 0315      Pain Inventory Average Pain 5 Pain Right Now 5 My pain is sharp and stabbing  In the last 24 hours, has pain interfered with the following? General activity 4 Relation with others 3 Enjoyment of life 4 What TIME of day is your pain at its worst? daytime, evening, and night Sleep (in general) Poor  Pain is worse with: walking, sitting, standing, and some activites Pain improves with: medication Relief from Meds: 7      Family History  Problem Relation Age of Onset   Hypertension Mother    Colon cancer Maternal Grandfather    Stomach cancer Neg Hx    Esophageal cancer Neg Hx    Pancreatic cancer Neg Hx    Social History   Socioeconomic History   Marital status: Single    Spouse name: Not on file   Number of children: Not on file   Years of education: Not on file   Highest education level: 12th grade  Occupational History   Not on file  Tobacco Use   Smoking status: Former    Average packs/day: 0.3 packs/day for 32.2 years (8.0 ttl pk-yrs)    Types: Cigarettes    Start date: 07/18/1990   Smokeless tobacco: Never   Tobacco comments:    4 cigarettes/pd recently quit 03/2021    Restarted smoking.  Quit smoking as of May 2024.  Updated 11/28/2022.  am  Vaping Use   Vaping  status: Never Used  Substance and Sexual Activity   Alcohol use: Not Currently   Drug use: No   Sexual activity: Yes  Other Topics Concern   Not on file  Social History Narrative   Not on file   Social Drivers of Health   Financial Resource Strain: High Risk (04/30/2023)   Overall Financial Resource Strain (CARDIA)    Difficulty of Paying Living Expenses: Hard  Food Insecurity: Food Insecurity Present (04/30/2023)   Hunger Vital Sign    Worried About Running Out of Food in the Last Year: Patient declined    Ran Out of Food in the Last Year: Sometimes true  Transportation Needs: No Transportation Needs (04/30/2023)   PRAPARE - Administrator, Civil Service (Medical): No    Lack of Transportation (Non-Medical): No  Physical Activity: Unknown (04/30/2023)   Exercise Vital Sign    Days of Exercise per Week: Patient declined    Minutes of Exercise per Session: 10 min  Stress: Stress Concern Present (04/30/2023)   [employer] of Occupational Health - Occupational Stress Questionnaire    Feeling of Stress : Very much  Social Connections: Socially Isolated (04/30/2023)   Social Connection and Isolation Panel [NHANES]    Frequency of Communication with Friends and Family: Three times a week    Frequency of Social Gatherings with Friends and Family: Twice a week    Attends Religious Services: Never    Database administrator or Organizations: No    Attends Engineer, structural: Not on file    Marital Status: Never married   Past Surgical History:  Procedure Laterality Date   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 Right 04/14/2021   Procedure: Achilles tendon debridement and reconstruction, excision of Haglund;  Surgeon: Toni Arthurs, MD;  Location: [facility];  Service: Orthopedics;  Laterality: Right;   GASTROCNEMIUS RECESSION Right 04/14/2021   Procedure: Right gastroc recession;  Surgeon: Toni Arthurs, MD;  Location: [facility];  Service: Orthopedics;  Laterality: Right;   Past Medical History:  Diagnosis Date   Chronic kidney disease    Elevated creatine kinase 09/2019   Hypertension    Iliac crest spur of left hip    Insomnia 10/2019   Pneumothorax on right    Polycystic kidney disease    Vitamin D deficiency 09/2019   There were no vitals taken for this visit.  Opioid Risk Score:   Fall Risk Score:  `1  Depression screen [facility] 2/9     03/01/2023   10:17 AM 02/01/2023   11:13 AM 10/27/2022    9:55 AM 07/21/2022   10:16 AM 12/28/2021    1:52 PM 09/29/2019    1:17 PM  Depression screen PHQ 2/9  Decreased Interest 0 3 0 0 0 0  Down, Depressed, Hopeless 0 2 1 0 0 0  PHQ - 2 Score 0 5 1 0 0 0  Altered sleeping  3  3    Tired, decreased energy  3      Change in appetite  3  0    Feeling bad or failure about yourself   2  0    Trouble concentrating  1  0    Moving slowly or fidgety/restless  2  2    Suicidal thoughts  1  0    PHQ-9 Score  20  5    Difficult doing work/chores  Extremely dIfficult  Not difficult at all        Review of Systems  Musculoskeletal:  Positive for back pain.       Pain in both hips  Psychiatric/Behavioral:         Depression   All other systems reviewed and are negative.      Objective:   Physical Exam   Gen: no distress, normal appearing HEENT: oral mucosa pink and moist, NCAT Chest: normal effort, normal rate of breathing Abd: soft, non-distended Ext: no edema Psych: pleasant, normal affect Skin: intact Neuro:  RUE: 5/5 Deltoid, 5/5 Biceps, 5/5 Triceps, 5/5 Wrist Ext, 5/5 Grip LUE: 5/5 Deltoid, 5/5 Biceps, 5/5 Triceps, 5/5 Wrist Ext, 5/5 Grip  RLE: HF 4+/5, KE 4+/5,  ADF 5/5, APF 5/5 LLE: HF 4+/5, KE 4+/5,  ADF 5/5, APF 5/5 -Hip and knee strength appears to be effort/pain limited Exam pain limited Sensory exam normal for light touch and pain in all 4 limbs. No limb ataxia or cerebellar signs. No abnormal tone appreciated.    Musculoskeletal:  Minimal TTP b/l Greater trochanter Mild TTP L spine paraspinal tenderness SLR negative Hip pain with internal and external rotation left greater than right- unchanged    Excerpt from [facility] note on 05/18/2022 I did review his x-rays from May 2022 and August of 2023 (left hip). Mild degenerative changes of the left hip. No other acute osseous abnormalities.     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  Denies SI or HI 5) Left thigh tingling 6) Obesity There is no height or weight on file to calculate BMI.  Plan  1) Continue gabapentin current dose as it is reported to be providing some benefit 2) continue tramadol 50 - 100mg  twice daily, Percocet was discontinued previously 3) UDS last visit with carboxy THC low level-this could be consistent with his report of using a locally purchased CBD compound previously.  Patient reports he has not been using this or marijuana anytime recently. 4) Nerve ablation [facility] [place] -patient reports 50% improvement with procedure.  It appears this was left lateral femoral and obturator articular nerve block.  Patient disappointed that this did not last longer however discussed that this was more diagnostic block.  Advised that he should contact the clinic as he may be able to do RFA which would last longer- Discussed this again today 08/13/23 5) Order Tens unit for pain Zynex not covered, ordered from [facility]- Mild benefit  6) Provided list of foods that can be helpful for pain prior visit 7) Pt on Zoloft 50 mg daily, Increase to 100mg  8) Recommend weight loss 9) Continue PDMP monitoring, random UDS, Pill counts 10) Patient forgot pill count prior visit.  Using tramadol fairly sparingly last refill was 06/14/23 11)  Recheck UDS today, pt had insurance issues with UDS past visit. Discussed if reoccurs he needs to let clinic know immediately. If not completed within 24h would not be able to prescribe further controlled medications.

## 2023-08-15 ENCOUNTER — Ambulatory Visit (INDEPENDENT_AMBULATORY_CARE_PROVIDER_SITE_OTHER): Payer: Self-pay | Admitting: Nurse Practitioner

## 2023-08-15 ENCOUNTER — Encounter: Payer: Self-pay | Admitting: Nurse Practitioner

## 2023-08-15 VITALS — BP 139/68 | HR 72 | Temp 98.2°F | Wt 274.0 lb

## 2023-08-15 DIAGNOSIS — N529 Male erectile dysfunction, unspecified: Secondary | ICD-10-CM | POA: Diagnosis not present

## 2023-08-15 DIAGNOSIS — N184 Chronic kidney disease, stage 4 (severe): Secondary | ICD-10-CM | POA: Diagnosis not present

## 2023-08-15 DIAGNOSIS — E538 Deficiency of other specified B group vitamins: Secondary | ICD-10-CM

## 2023-08-15 DIAGNOSIS — E559 Vitamin D deficiency, unspecified: Secondary | ICD-10-CM | POA: Diagnosis not present

## 2023-08-15 DIAGNOSIS — E1122 Type 2 diabetes mellitus with diabetic chronic kidney disease: Secondary | ICD-10-CM

## 2023-08-15 DIAGNOSIS — Z13220 Encounter for screening for lipoid disorders: Secondary | ICD-10-CM | POA: Diagnosis not present

## 2023-08-15 LAB — POCT GLYCOSYLATED HEMOGLOBIN (HGB A1C): Hemoglobin A1C: 5.5 % (ref 4.0–5.6)

## 2023-08-15 MED ORDER — SILDENAFIL CITRATE 100 MG PO TABS: 50.0000 mg | ORAL_TABLET | Freq: Every day | ORAL | 3 refills | Status: DC | PRN

## 2023-08-15 NOTE — Progress Notes (Signed)
 Subjective   Patient ID: Kevin Todd, male    DOB: May 09, 1976, 48 y.o.   MRN: [id number]  Chief Complaint  Patient presents with   Hypertension    Patient stated that his blood pressures have been in normal range     Referring provider: Ivonne Andrew, NP  Kevin Todd is a 48 y.o. male with Past Medical History: No date: Chronic kidney disease 09/2019: Elevated creatine kinase No date: Hypertension No date: Iliac crest spur of left hip 10/2019: Insomnia No date: Pneumothorax on right No date: Polycystic kidney disease 09/2019: Vitamin D deficiency   HPI  Patient presents today to follow-up on diabetes. A1C in office today is 5.5.  Patient is compliant with medications.  [facility]. Patient does have chronic hip pain.  He is currently being followed by Ortho.  He was told that he does need a total hip replacement to both hips but does need to lose weight first. We also referred him to pain clinic. He has followed with them. Will need labs checked today. Denies f/c/s, n/v/d, hemoptysis, PND, leg swelling. Denies chest pain or edema.   Allergies  Allergen Reactions   Nsaids     Kidney Disease  Other Reaction(s): Other (See Comments)  Renal Disease    Immunization History  Administered Date(s) Administered   Moderna Sars-Covid-2 Vaccination 01/15/2020   PFIZER(Purple Top)SARS-COV-2 Vaccination 12/18/2019    Tobacco History: Social History   Tobacco Use  Smoking Status Former   Average packs/day: 0.3 packs/day for 32.2 years (8.0 ttl pk-yrs)   Types: Cigarettes   Start date: 07/18/1990  Smokeless Tobacco Never  Tobacco Comments   4 cigarettes/pd recently quit 03/2021   Restarted smoking.  Quit smoking as of May 2024.  Updated 11/28/2022.  am   Counseling given: Not Answered Tobacco comments: 4 cigarettes/pd recently quit 03/2021 Restarted smoking.  Quit smoking as of May 2024.  Updated 11/28/2022.  am   Outpatient  Encounter Medications as of 08/15/2023  Medication Sig   amLODipine (NORVASC) 10 MG tablet Take 1 tablet (10 mg total) by mouth daily.   atorvastatin (LIPITOR) 80 MG tablet Take 1 tablet (80 mg total) by mouth daily.   furosemide (LASIX) 40 MG tablet Take 40 mg by mouth daily.   gabapentin (NEURONTIN) 300 MG capsule Take 300 mg by mouth 3 (three) times daily.   hydrALAZINE (APRESOLINE) 100 MG tablet Take 100 mg by mouth 2 (two) times daily.   JYNARQUE 90 & 30 MG TBPK Take 1 tablet by mouth in the morning and at bedtime.   metoprolol succinate (TOPROL-XL) 50 MG 24 hr tablet Take 50 mg by mouth daily.   Semaglutide, 1 MG/DOSE, 4 MG/3ML SOPN Inject 1 mg as directed once a week.   sertraline (ZOLOFT) 100 MG tablet Take 1 tablet (100 mg total) by mouth daily.   sodium bicarbonate 650 MG tablet Take 650 mg by mouth 2 (two) times daily.   tiZANidine (ZANAFLEX) 4 MG tablet Take 1 tablet (4 mg total) by mouth at bedtime.   traMADol (ULTRAM) 50 MG tablet Take 1-2 tablets (50-100 mg total) by mouth 2 (two) times daily as needed for moderate pain (pain score 4-6).   [DISCONTINUED] sildenafil (VIAGRA) 100 MG tablet Take 0.5-1 tablets (50-100 mg total) by mouth daily as needed for erectile dysfunction.   Continuous Blood Gluc Sensor (FREESTYLE LIBRE 3 SENSOR) MISC Place 1 sensor on the skin every 14 days. Use to check glucose  continuously (Patient not taking: Reported on 08/15/2023)   glucose blood (ACCU-CHEK GUIDE) test strip Use as directed to test blood sugars BID (Patient not taking: Reported on 08/15/2023)   sildenafil (VIAGRA) 100 MG tablet Take 0.5-1 tablets (50-100 mg total) by mouth daily as needed for erectile dysfunction.   No facility-administered encounter medications on file as of 08/15/2023.    Review of Systems  Review of Systems  Constitutional: Negative.   HENT: Negative.    Cardiovascular: Negative.   Gastrointestinal: Negative.   Allergic/Immunologic: Negative.   Neurological: Negative.    Psychiatric/Behavioral: Negative.       Objective:   BP 139/68   Pulse 72   Temp 98.2 F (36.8 C) (Oral)   Wt 274 lb (124.3 kg)   SpO2 100%   BMI 40.46 kg/m   Wt Readings from Last 5 Encounters:  08/15/23 274 lb (124.3 kg)  08/13/23 276 lb (125.2 kg)  06/14/23 279 lb (126.6 kg)  05/31/23 282 lb 9.6 oz (128.2 kg)  04/30/23 289 lb 9.6 oz (131.4 kg)     Physical Exam Vitals and nursing note reviewed.  Constitutional:      General: He is not in acute distress.    Appearance: He is well-developed.  Cardiovascular:     Rate and Rhythm: Normal rate and regular rhythm.  Pulmonary:     Effort: Pulmonary effort is normal.     Breath sounds: Normal breath sounds.  Skin:    General: Skin is warm and dry.  Neurological:     Mental Status: He is alert and oriented to person, place, and time.       Assessment & Plan:   Lipid screening -     Lipid panel  Type 2 diabetes mellitus with stage 4 chronic kidney disease, without long-term current use of insulin (HCC) -     POCT glycosylated hemoglobin (Hb A1C) -     Microalbumin / creatinine urine ratio -     CBC -     Comprehensive metabolic panel with GFR  Erectile dysfunction, unspecified erectile dysfunction type -     Sildenafil Citrate; Take 0.5-1 tablets (50-100 mg total) by mouth daily as needed for erectile dysfunction.  Dispense: 20 tablet; Refill: 3  Vitamin D deficiency -     VITAMIN D 25 Hydroxy (Vit-D Deficiency, Fractures)  Vitamin B12 deficiency -     Vitamin B12     Return in about 3 months (around 11/14/2023).   Ivonne Andrew, NP 08/15/2023

## 2023-08-15 NOTE — Patient Instructions (Signed)
 1. Type 2 diabetes mellitus with stage 4 chronic kidney disease, without long-term current use of insulin (HCC)  - POCT glycosylated hemoglobin (Hb A1C) - Urine Albumin/Creatinine with ratio (send out) [LAB689] - CBC - Comprehensive metabolic panel with GFR  2. Erectile dysfunction, unspecified erectile dysfunction type  - sildenafil (VIAGRA) 100 MG tablet; Take 0.5-1 tablets (50-100 mg total) by mouth daily as needed for erectile dysfunction.  Dispense: 20 tablet; Refill: 3  3. Lipid screening (Primary)  - Lipid Panel  4. Vitamin D deficiency  - Vitamin D, 25-hydroxy  5. Vitamin B12 deficiency  - Vitamin B12

## 2023-08-16 ENCOUNTER — Other Ambulatory Visit: Payer: Self-pay | Admitting: Nurse Practitioner

## 2023-08-16 DIAGNOSIS — N289 Disorder of kidney and ureter, unspecified: Secondary | ICD-10-CM

## 2023-08-16 LAB — COMPREHENSIVE METABOLIC PANEL WITH GFR
ALT: 12 IU/L (ref 0–44)
AST: 13 IU/L (ref 0–40)
Albumin: 4.2 g/dL (ref 4.1–5.1)
Alkaline Phosphatase: 108 IU/L (ref 44–121)
BUN/Creatinine Ratio: 9 (ref 9–20)
BUN: 31 mg/dL — ABNORMAL HIGH (ref 6–24)
Bilirubin Total: 0.3 mg/dL (ref 0.0–1.2)
CO2: 20 mmol/L (ref 20–29)
Calcium: 9.2 mg/dL (ref 8.7–10.2)
Chloride: 110 mmol/L — ABNORMAL HIGH (ref 96–106)
Creatinine, Ser: 3.5 mg/dL — ABNORMAL HIGH (ref 0.76–1.27)
Globulin, Total: 1.8 g/dL (ref 1.5–4.5)
Glucose: 92 mg/dL (ref 70–99)
Potassium: 4.9 mmol/L (ref 3.5–5.2)
Sodium: 144 mmol/L (ref 134–144)
Total Protein: 6 g/dL (ref 6.0–8.5)
eGFR: 21 mL/min/{1.73_m2} — ABNORMAL LOW (ref >59)

## 2023-08-16 LAB — CBC
Hematocrit: 37.2 % — ABNORMAL LOW (ref 37.5–51.0)
Hemoglobin: 12.1 g/dL — ABNORMAL LOW (ref 13.0–17.7)
MCH: 30.7 pg (ref 26.6–33.0)
MCHC: 32.5 g/dL (ref 31.5–35.7)
MCV: 94 fL (ref 79–97)
Platelets: 274 10*3/uL (ref 150–450)
RBC: 3.94 x10E6/uL — ABNORMAL LOW (ref 4.14–5.80)
RDW: 13.3 % (ref 11.6–15.4)
WBC: 7.4 10*3/uL (ref 3.4–10.8)

## 2023-08-16 LAB — LIPID PANEL
Chol/HDL Ratio: 4.1 ratio (ref 0.0–5.0)
Cholesterol, Total: 161 mg/dL (ref 100–199)
HDL: 39 mg/dL — ABNORMAL LOW (ref >39)
LDL Chol Calc (NIH): 101 mg/dL — ABNORMAL HIGH (ref 0–99)
Triglycerides: 114 mg/dL (ref 0–149)
VLDL Cholesterol Cal: 21 mg/dL (ref 5–40)

## 2023-08-16 LAB — MICROALBUMIN / CREATININE URINE RATIO
Creatinine, Urine: 26 mg/dL
Microalb/Creat Ratio: 2214 mg/g{creat} — ABNORMAL HIGH (ref 0–29)
Microalbumin, Urine: 575.7 ug/mL

## 2023-08-16 LAB — VITAMIN B12: Vitamin B-12: 335 pg/mL (ref 232–1245)

## 2023-08-16 LAB — VITAMIN D 25 HYDROXY (VIT D DEFICIENCY, FRACTURES): Vit D, 25-Hydroxy: 13.8 ng/mL — ABNORMAL LOW (ref 30.0–100.0)

## 2023-08-16 MED ORDER — VITAMIN D (ERGOCALCIFEROL) 1.25 MG (50000 UNIT) PO CAPS
50000.0000 [IU] | ORAL_CAPSULE | ORAL | 2 refills | Status: DC
Start: 2023-08-16 — End: 2024-01-02

## 2023-08-17 LAB — TOXASSURE SELECT,+ANTIDEPR,UR

## 2023-08-23 ENCOUNTER — Ambulatory Visit (INDEPENDENT_AMBULATORY_CARE_PROVIDER_SITE_OTHER): Payer: Medicaid Other | Admitting: Primary Care

## 2023-08-23 ENCOUNTER — Encounter: Payer: Self-pay | Admitting: Primary Care

## 2023-08-23 VITALS — BP 148/78 | HR 64 | Temp 98.6°F | Ht 69.0 in | Wt 272.8 lb

## 2023-08-23 DIAGNOSIS — G4730 Sleep apnea, unspecified: Secondary | ICD-10-CM | POA: Diagnosis not present

## 2023-08-23 NOTE — Progress Notes (Signed)
 @Patient  ID: Kevin Todd, male    DOB: June 26, 1975, 48 y.o.   MRN: [id number]  Chief Complaint  Patient presents with   Follow-up    Wearing CPAP-doing good no complaints    Referring provider: Jerrlyn Morel, NP  HPI: 48 year old, current every day smoker. PMH significant for HTN, mild sleep apnea, spontaneous pneumothorax, DKA, type 2 diabetes, CKD, obesity.   08/23/2023 Discussed the use of AI scribe software for clinical note transcription with the patient, who gave verbal consent to proceed.  History of Present Illness   Kevin Todd is a 48 year old male with mild sleep apnea who presents for a follow-up after starting CPAP therapy. He was previously referred to Dr. Ardell Koller for an oral appliance but treatment was not pursued.   Initially, he presented with symptoms of snoring, witnessed apnea, and extreme restlessness, waking up four to five times a night. A sleep study conducted on October 25, 2022, revealed very mild sleep apnea with an apnea-hypopnea index (AHI) of 5.9 events per hour and an oxygen saturation low of 88%, with no time spent below this level. He considered an oral appliance but opted for CPAP due to concerns about drooling and sleeping with his mouth open.  Since starting CPAP therapy, he has noticed a reduction in snoring but continues to experience restlessness, which he attributes to his kidney medication, Jynarque, causing increased urination at night. He has not noticed any significant improvement in energy levels or alertness during the day and remains tired. His current AHI with CPAP is 4.7 events per hour.  He is also on Ozempic for weight loss and diabetes, prescribed by his kidney doctor. 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  He is not currently working due to a previous Achilles tendon injury and maintains a late sleep schedule, going to bed around midnight or 1 AM and waking up around 7 AM,  resulting in about six hours of sleep per night. He does not take naps during the day and has not been on any sleep aids recently, though he has tried melatonin in the past.      Airview download 07/22/2023 - 08/20/2023 Usage days 20/30 days (93%) Average usage 4 hours 16 minutes Pressure 5 to 15 cm H2O (12.4 cm H2O-95%) Air leaks 8.1 L/min (95%) AHI 4.7   Allergies  Allergen Reactions   Nsaids     Kidney Disease  Other Reaction(s): Other (See Comments)  Renal Disease    Immunization History  Administered Date(s) Administered   Moderna Sars-Covid-2 Vaccination 01/15/2020   PFIZER(Purple Top)SARS-COV-2 Vaccination 12/18/2019    Past Medical History:  Diagnosis Date   Chronic kidney disease    Elevated creatine kinase 09/2019   Hypertension    Iliac crest spur of left hip    Insomnia 10/2019   Pneumothorax on right    Polycystic kidney disease    Vitamin D deficiency 09/2019    Tobacco History: Social History   Tobacco Use  Smoking Status Every Day   Average packs/day: 0.3 packs/day for 32.2 years (8.0 ttl pk-yrs)   Types: Cigarettes   Start date: 07/18/1990  Smokeless Tobacco Never  Tobacco Comments   4 cigarettes/pd recently quit 03/2021   Restarted smoking.  Quit smoking as of May 2024.  Updated 11/28/2022.  am   Ready to quit: Not Answered Counseling given: Not Answered Tobacco comments: 4 cigarettes/pd recently quit 03/2021 Restarted smoking.  Quit smoking as of May 2024.  Updated 11/28/2022.  am   Outpatient Medications Prior to Visit  Medication Sig Dispense Refill   amLODipine (NORVASC) 10 MG tablet Take 1 tablet (10 mg total) by mouth daily. 30 tablet 1   atorvastatin (LIPITOR) 80 MG tablet Take 1 tablet (80 mg total) by mouth daily. 90 tablet 3   furosemide (LASIX) 40 MG tablet Take 40 mg by mouth daily.     gabapentin (NEURONTIN) 300 MG capsule Take 300 mg by mouth 3 (three) times daily.     hydrALAZINE (APRESOLINE) 100 MG tablet Take 100 mg by mouth 2  (two) times daily.     JYNARQUE 90 & 30 MG TBPK Take 1 tablet by mouth in the morning and at bedtime.     metoprolol succinate (TOPROL-XL) 50 MG 24 hr tablet Take 50 mg by mouth daily.     Semaglutide, 1 MG/DOSE, 4 MG/3ML SOPN Inject 1 mg as directed once a week. 3 mL 2   sertraline (ZOLOFT) 100 MG tablet Take 1 tablet (100 mg total) by mouth daily. 30 tablet 4   sildenafil (VIAGRA) 100 MG tablet Take 0.5-1 tablets (50-100 mg total) by mouth daily as needed for erectile dysfunction. 20 tablet 3   sodium bicarbonate 650 MG tablet Take 650 mg by mouth 2 (two) times daily.     tiZANidine (ZANAFLEX) 4 MG tablet Take 1 tablet (4 mg total) by mouth at bedtime. 30 tablet 0   traMADol (ULTRAM) 50 MG tablet Take 1-2 tablets (50-100 mg total) by mouth 2 (two) times daily as needed for moderate pain (pain score 4-6). 120 tablet 0   Vitamin D, Ergocalciferol, (DRISDOL) 1.25 MG (50000 UNIT) CAPS capsule Take 1 capsule (50,000 Units total) by mouth every 7 (seven) days. 5 capsule 2   Continuous Blood Gluc Sensor (FREESTYLE LIBRE 3 SENSOR) MISC Place 1 sensor on the skin every 14 days. Use to check glucose continuously (Patient not taking: Reported on 08/23/2023) 2 each 11   glucose blood (ACCU-CHEK GUIDE) test strip Use as directed to test blood sugars BID (Patient not taking: Reported on 08/23/2023) 200 strip 5   No facility-administered medications prior to visit.    Review of Systems  Review of Systems  Constitutional: Negative.   HENT: Negative.    Respiratory: Negative.    Psychiatric/Behavioral:  Positive for sleep disturbance.    Physical Exam  BP (!) 148/78 (BP Location: Left Arm, Patient Position: Sitting, Cuff Size: Large)   Pulse 64   Temp 98.6 F (37 C) (Oral)   Ht 5\' 9"  (1.753 m)   Wt 272 lb 12.8 oz (123.7 kg)   SpO2 97%   BMI 40.29 kg/m  Physical Exam Constitutional:      Appearance: Normal appearance. He is obese. He is not ill-appearing.  Cardiovascular:     Rate and Rhythm:  Normal rate and regular rhythm.  Pulmonary:     Effort: Pulmonary effort is normal.     Breath sounds: Normal breath sounds.  Musculoskeletal:        General: Normal range of motion.  Skin:    General: Skin is warm and dry.  Neurological:     General: No focal deficit present.     Mental Status: He is alert and oriented to person, place, and time. Mental status is at baseline.  Psychiatric:        Mood and Affect: Mood normal.        Behavior: Behavior normal.  Thought Content: Thought content normal.        Judgment: Judgment normal.      Lab Results:  CBC    Component Value Date/Time   WBC 7.4 08/15/2023 1043   WBC 6.9 12/11/2021 0801   RBC 3.94 (L) 08/15/2023 1043   RBC 3.69 (L) 12/11/2021 0801   HGB 12.1 (L) 08/15/2023 1043   HCT 37.2 (L) 08/15/2023 1043   PLT 274 08/15/2023 1043   MCV 94 08/15/2023 1043   MCH 30.7 08/15/2023 1043   MCH 31.7 12/11/2021 0801   MCHC 32.5 08/15/2023 1043   MCHC 34.6 12/11/2021 0801   RDW 13.3 08/15/2023 1043   LYMPHSABS 1.3 12/11/2021 0801   LYMPHSABS 1.6 09/29/2019 1402   MONOABS 0.4 12/11/2021 0801   EOSABS 0.1 12/11/2021 0801   EOSABS 0.2 09/29/2019 1402   BASOSABS 0.1 12/11/2021 0801   BASOSABS 0.1 09/29/2019 1402    BMET    Component Value Date/Time   NA 144 08/15/2023 1043   K 4.9 08/15/2023 1043   CL 110 (H) 08/15/2023 1043   CO2 20 08/15/2023 1043   GLUCOSE 92 08/15/2023 1043   GLUCOSE 253 (H) 12/13/2021 0413   BUN 31 (H) 08/15/2023 1043   CREATININE 3.50 (H) 08/15/2023 1043   CALCIUM 9.2 08/15/2023 1043   GFRNONAA 24 (L) 12/13/2021 0413   GFRAA 29 (L) 09/29/2019 1402    BNP No results found for: "BNP"  ProBNP No results found for: "PROBNP"  Imaging: No results found.   Assessment & Plan:   1. Mild sleep apnea (Primary) - Ambulatory Referral for DME   Assessment and Plan    Obstructive Sleep Apnea (OSA) Mild OSA with AHI of 5.9 events/hour, reduced to 4.7 with CPAP. Persistent  restlessness likely due to diuretic effects of kidney medication. No significant improvement in energy or alertness. Open to continuing CPAP. - Adjust CPAP pressure settings to 10-18 cm H2O to optimize apnea control. - Follow up in 8-12 weeks to assess pressure adjustment effectiveness.  Insomnia Difficulty sleeping with 5-6 hours/night. Restlessness possibly due to diuretic effects of kidney medication. Open to trying melatonin and possibly trazodone. - Recommend melatonin 3-5 mg at bedtime. - Consider trazodone if melatonin is ineffective.  Weight Management Actively working on weight loss, continue Ozempic     Antonio Baumgarten, NP 08/23/2023

## 2023-08-23 NOTE — Patient Instructions (Addendum)
-  OBSTRUCTIVE SLEEP APNEA (OSA): Obstructive Sleep Apnea is a condition where your breathing repeatedly stops and starts during sleep. Your CPAP therapy has slightly improved your condition, reducing your apnea-hypopnea index (AHI) from 5.9 to 4.7 events per hour. We will adjust your CPAP pressure settings to 10-18 cm H2O and follow up in 8-12 weeks to see if this helps further.  -INSOMNIA: Insomnia is difficulty falling or staying asleep. You are currently getting 5-6 hours of sleep per night, and your restlessness may be due to your kidney medication. We recommend trying melatonin at 3-5 mg at bedtime. If this does not help, we may consider prescribing trazodone.  -WEIGHT MANAGEMENT: You are working on weight loss with the help of Ozempic, which is also helping to manage your borderline diabetes. If you do not meet your weight loss goals, we will discuss with your nephrologist the possibility of increasing your Ozempic dose.  INSTRUCTIONS:  Please follow up in 8-12 weeks to assess the effectiveness of the CPAP pressure adjustment. Continue taking melatonin as recommended, and let us know if it does not help with your sleep. Discuss with your nephrologist about possibly increasing your Ozempic dose if you do not achieve your weight loss goals.  Follow-up 8-12 weeks with Kevin Todd, virtual visit on Friday afternoon

## 2023-09-02 ENCOUNTER — Other Ambulatory Visit: Payer: Self-pay | Admitting: Nurse Practitioner

## 2023-09-25 ENCOUNTER — Telehealth: Payer: Self-pay

## 2023-09-25 NOTE — Telephone Encounter (Signed)
(  Key: [id number]) PA Case ID #: [id number] Submitted for Tramadol

## 2023-09-26 NOTE — Telephone Encounter (Signed)
 Approved 09/25/23-03/23/24

## 2023-10-10 ENCOUNTER — Other Ambulatory Visit: Payer: Self-pay

## 2023-10-10 DIAGNOSIS — N186 End stage renal disease: Secondary | ICD-10-CM

## 2023-10-12 ENCOUNTER — Encounter: Admitting: Physical Medicine & Rehabilitation

## 2023-10-25 NOTE — H&P (View-Only) (Signed)
 Patient ID: Kevin Todd, male   DOB: February 26, 1976, 48 y.o.   MRN: [id number]  Reason for Consult: New Patient (Initial Visit)   Referred by Nicolas Barren, MD  Subjective:     HPI Kevin Todd is a 48 y.o. male who presents for evaluation HD access creation.  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 04/14/2021   Procedure: Achilles tendon debridement and reconstruction, excision of Haglund;  Surgeon: Amada Backer, MD;  Location: [facility];  Service: Orthopedics;  Laterality: Right;   GASTROCNEMIUS RECESSION Right 04/14/2021   Procedure: Right gastroc recession;  Surgeon: Amada Backer, MD;  Location: [facility];  Service: Orthopedics;  Laterality: Right;    Short Social History:  Social History   Tobacco Use   Smoking status: Every Day    Average packs/day: 0.3 packs/day for 32.2 years (8.0 ttl pk-yrs)    Types: Cigarettes    Start date: 07/18/1990   Smokeless tobacco: Never   Tobacco comments:    4 cigarettes/pd recently quit 03/2021    Restarted smoking.  Quit smoking as of May 2024.  Updated 11/28/2022.  am  Substance Use Topics   Alcohol use: Not Currently    Allergies  Allergen Reactions   Nsaids     Kidney Disease  Other Reaction(s): Other (See Comments)  Renal Disease    Current Outpatient Medications  Medication Sig Dispense Refill   amLODipine  (NORVASC ) 10 MG tablet Take 1 tablet (10 mg  total) by mouth daily. 30 tablet 1   atorvastatin  (LIPITOR) 80 MG tablet Take 1 tablet (80 mg total) by mouth daily. 90 tablet 3   Continuous Blood Gluc Sensor (FREESTYLE LIBRE 3 SENSOR) MISC Place 1 sensor on the skin every 14 days. Use to check glucose continuously 2 each 11   furosemide (LASIX) 40 MG tablet Take 40 mg by mouth daily.     gabapentin  (NEURONTIN ) 300 MG capsule Take 300 mg by mouth 3 (three) times daily.     glucose blood (ACCU-CHEK GUIDE) test strip Use as directed to test blood sugars BID 200 strip 5   hydrALAZINE  (APRESOLINE ) 100 MG tablet Take 100 mg by mouth 2 (two) times daily.     JYNARQUE  90 & 30 MG TBPK Take 1 tablet by mouth in the morning and at bedtime.     metoprolol  succinate (TOPROL -XL) 50 MG 24 hr tablet Take 50 mg by mouth daily.     Semaglutide , 1 MG/DOSE, (OZEMPIC , 1 MG/DOSE,) 4 MG/3ML SOPN INJECT 1 MG ONCE A WEEK AS DIRECTED 3 mL 2   sertraline  (ZOLOFT ) 100 MG tablet Take 1 tablet (100 mg total) by mouth daily. 30 tablet 4   sildenafil  (VIAGRA ) 100 MG tablet Take 0.5-1 tablets (50-100 mg total) by mouth daily as needed for erectile dysfunction. 20 tablet 3   sodium bicarbonate  650 MG tablet Take 650 mg by mouth 2 (two) times daily.     tiZANidine  (ZANAFLEX ) 4  MG tablet Take 1 tablet (4 mg total) by mouth at bedtime. 30 tablet 0   traMADol  (ULTRAM ) 50 MG tablet Take 1-2 tablets (50-100 mg total) by mouth 2 (two) times daily as needed for moderate pain (pain score 4-6). 120 tablet 0   Vitamin D , Ergocalciferol , (DRISDOL ) 1.25 MG (50000 UNIT) CAPS capsule Take 1 capsule (50,000 Units total) by mouth every 7 (seven) days. 5 capsule 2   No current facility-administered medications for this visit.    REVIEW OF SYSTEMS All other systems were reviewed and are negative    Objective:  Objective   Vitals:   10/26/23 1038  BP: 138/85  Pulse: 61  Resp: 20  Temp: 98 F (36.7 C)  TempSrc: Temporal  SpO2: 100%  Weight: 259 lb 12.8 oz (117.8 kg)  Height: 5' 9  (1.753 m)   Body mass index is 38.37 kg/m.  Physical Exam General: no acute distress Cardiac: hemodynamically stable Pulm: normal work of breathing Neuro: alert, no focal deficit Extremities: No edema cyanosis or wounds Vascular:   Right: palpable brachial, radial  Left: palpable brachial, radial   Data: UE arterial duplex Right Pre-Dialysis Findings:  +-----------------------+----------+--------------------+---------+--------  +  Location              PSV (cm/s)Intralum. Diam. (cm)Waveform  Comments  +-----------------------+----------+--------------------+---------+--------  +  Brachial Antecub. fossa77        0.50                triphasic           +-----------------------+----------+--------------------+---------+--------  +  Radial Art at Wrist    59        0.26                triphasic           +-----------------------+----------+--------------------+---------+--------  +  Ulnar Art at Wrist     80        0.21                triphasic           +-----------------------+----------+--------------------+---------+--------  +    Left Pre-Dialysis Findings:  +-----------------------+----------+--------------------+---------+--------  +  Location              PSV (cm/s)Intralum. Diam. (cm)Waveform  Comments  +-----------------------+----------+--------------------+---------+--------  +  Brachial Antecub. fossa57        0.53                triphasic           +-----------------------+----------+--------------------+---------+--------  +  Radial Art at Wrist    84        0.25                triphasic           +-----------------------+----------+--------------------+---------+--------  +  Ulnar Art at Wrist     87        0.20                triphasic           +-----------------------+----------+--------------------+---------+--------    Vein mapping +-----------------+-------------+----------+---------+   Right Cephalic   Diameter (cm)Depth (cm)Findings   +-----------------+-------------+----------+---------+  Shoulder            0.30        1.05              +-----------------+-------------+----------+---------+  Prox upper arm       0.29  0.64   branching  +-----------------+-------------+----------+---------+  Mid upper arm        0.27        0.59              +-----------------+-------------+----------+---------+  Dist upper arm       0.31        0.64              +-----------------+-------------+----------+---------+  Antecubital fossa    0.30        0.20              +-----------------+-------------+----------+---------+  Prox forearm         0.27        0.23              +-----------------+-------------+----------+---------+  Mid forearm          0.27        0.21              +-----------------+-------------+----------+---------+  Wrist               0.26        0.34   branching  +-----------------+-------------+----------+---------+   +-----------------+-------------+----------+--------------+  Right Basilic    Diameter (cm)Depth (cm)   Findings     +-----------------+-------------+----------+--------------+  Shoulder                               not visualized  +-----------------+-------------+----------+--------------+  Prox upper arm                          not visualized  +-----------------+-------------+----------+--------------+  Mid upper arm        0.42        1.39                   +-----------------+-------------+----------+--------------+  Dist upper arm       0.39        0.58     branching     +-----------------+-------------+----------+--------------+  Antecubital fossa    0.19        0.49                   +-----------------+-------------+----------+--------------+  Prox forearm         0.17        0.23                    +-----------------+-------------+----------+--------------+  Mid forearm          0.12        0.23     branching     +-----------------+-------------+----------+--------------+  Wrist                                  not visualized  +-----------------+-------------+----------+--------------+   +-----------------+-------------+----------+---------+  Left Cephalic    Diameter (cm)Depth (cm)Findings   +-----------------+-------------+----------+---------+  Shoulder            0.26        1.56              +-----------------+-------------+----------+---------+  Prox upper arm       0.28        0.63   branching  +-----------------+-------------+----------+---------+  Mid upper arm        0.21        0.76              +-----------------+-------------+----------+---------+  Dist upper arm       0.28        0.71   branching  +-----------------+-------------+----------+---------+  Antecubital fossa    0.24        0.57   branching  +-----------------+-------------+----------+---------+  Prox forearm         0.16        0.29              +-----------------+-------------+----------+---------+  Mid forearm          0.23        0.37              +-----------------+-------------+----------+---------+  Wrist               0.32        0.37   branching  +-----------------+-------------+----------+---------+   +-----------------+-------------+----------+--------------+  Left Basilic     Diameter (cm)Depth (cm)   Findings     +-----------------+-------------+----------+--------------+  Shoulder                               not visualized  +-----------------+-------------+----------+--------------+  Prox upper arm                          not visualized  +-----------------+-------------+----------+--------------+  Mid upper arm        0.46        1.06                    +-----------------+-------------+----------+--------------+  Dist upper arm       0.32        0.62     branching     +-----------------+-------------+----------+--------------+  Antecubital fossa    0.29        0.51     branching     +-----------------+-------------+----------+--------------+  Prox forearm                            not visualized  +-----------------+-------------+----------+--------------+  Mid forearm                             not visualized  +-----------------+-------------+----------+--------------+  Wrist                                  not visualized  +-----------------+-------------+----------+--------------+   Note from nephrologist reviewed CKD stage IV, please fistula, hold for graft      Assessment/Plan:     Kevin Todd is a 48 y.o. male with CKD stage IV who presents to discuss permanent access creation.  Dominant hand: right Previous access surgeries: none Previous catheters: none Other arm surgeries/injuries: denies  The vein mapping suggests there is a suitable left cephalic vein and we discussed that they are a candidate for AVF  The risks an benefits including of access creation were reviewed including: need for additional procedures, need for additional creations, steal, ischemia monomelic neuropathy, failure of access, and bleeding. The patient expressed understand and is willing to proceed.  I explained that I will perform intraoperative vein mapping to confirm the above findings and will determine the most appropriate access to create but with a preoperative plan for left arm brachiocephalic AVF.     Philipp Brawn MD [facility]

## 2023-10-25 NOTE — Progress Notes (Signed)
 Patient ID: Kevin Todd, male   DOB: February 26, 1976, 48 y.o.   MRN: [id number]  Reason for Consult: New Patient (Initial Visit)   Referred by Nicolas Barren, MD  Subjective:     HPI Kevin Todd is a 48 y.o. male who presents for evaluation HD access creation.  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 04/14/2021   Procedure: Achilles tendon debridement and reconstruction, excision of Haglund;  Surgeon: Amada Backer, MD;  Location: [facility];  Service: Orthopedics;  Laterality: Right;   GASTROCNEMIUS RECESSION Right 04/14/2021   Procedure: Right gastroc recession;  Surgeon: Amada Backer, MD;  Location: [facility];  Service: Orthopedics;  Laterality: Right;    Short Social History:  Social History   Tobacco Use   Smoking status: Every Day    Average packs/day: 0.3 packs/day for 32.2 years (8.0 ttl pk-yrs)    Types: Cigarettes    Start date: 07/18/1990   Smokeless tobacco: Never   Tobacco comments:    4 cigarettes/pd recently quit 03/2021    Restarted smoking.  Quit smoking as of May 2024.  Updated 11/28/2022.  am  Substance Use Topics   Alcohol use: Not Currently    Allergies  Allergen Reactions   Nsaids     Kidney Disease  Other Reaction(s): Other (See Comments)  Renal Disease    Current Outpatient Medications  Medication Sig Dispense Refill   amLODipine  (NORVASC ) 10 MG tablet Take 1 tablet (10 mg  total) by mouth daily. 30 tablet 1   atorvastatin  (LIPITOR) 80 MG tablet Take 1 tablet (80 mg total) by mouth daily. 90 tablet 3   Continuous Blood Gluc Sensor (FREESTYLE LIBRE 3 SENSOR) MISC Place 1 sensor on the skin every 14 days. Use to check glucose continuously 2 each 11   furosemide (LASIX) 40 MG tablet Take 40 mg by mouth daily.     gabapentin  (NEURONTIN ) 300 MG capsule Take 300 mg by mouth 3 (three) times daily.     glucose blood (ACCU-CHEK GUIDE) test strip Use as directed to test blood sugars BID 200 strip 5   hydrALAZINE  (APRESOLINE ) 100 MG tablet Take 100 mg by mouth 2 (two) times daily.     JYNARQUE  90 & 30 MG TBPK Take 1 tablet by mouth in the morning and at bedtime.     metoprolol  succinate (TOPROL -XL) 50 MG 24 hr tablet Take 50 mg by mouth daily.     Semaglutide , 1 MG/DOSE, (OZEMPIC , 1 MG/DOSE,) 4 MG/3ML SOPN INJECT 1 MG ONCE A WEEK AS DIRECTED 3 mL 2   sertraline  (ZOLOFT ) 100 MG tablet Take 1 tablet (100 mg total) by mouth daily. 30 tablet 4   sildenafil  (VIAGRA ) 100 MG tablet Take 0.5-1 tablets (50-100 mg total) by mouth daily as needed for erectile dysfunction. 20 tablet 3   sodium bicarbonate  650 MG tablet Take 650 mg by mouth 2 (two) times daily.     tiZANidine  (ZANAFLEX ) 4  MG tablet Take 1 tablet (4 mg total) by mouth at bedtime. 30 tablet 0   traMADol  (ULTRAM ) 50 MG tablet Take 1-2 tablets (50-100 mg total) by mouth 2 (two) times daily as needed for moderate pain (pain score 4-6). 120 tablet 0   Vitamin D , Ergocalciferol , (DRISDOL ) 1.25 MG (50000 UNIT) CAPS capsule Take 1 capsule (50,000 Units total) by mouth every 7 (seven) days. 5 capsule 2   No current facility-administered medications for this visit.    REVIEW OF SYSTEMS All other systems were reviewed and are negative    Objective:  Objective   Vitals:   10/26/23 1038  BP: 138/85  Pulse: 61  Resp: 20  Temp: 98 F (36.7 C)  TempSrc: Temporal  SpO2: 100%  Weight: 259 lb 12.8 oz (117.8 kg)  Height: 5' 9  (1.753 m)   Body mass index is 38.37 kg/m.  Physical Exam General: no acute distress Cardiac: hemodynamically stable Pulm: normal work of breathing Neuro: alert, no focal deficit Extremities: No edema cyanosis or wounds Vascular:   Right: palpable brachial, radial  Left: palpable brachial, radial   Data: UE arterial duplex Right Pre-Dialysis Findings:  +-----------------------+----------+--------------------+---------+--------  +  Location              PSV (cm/s)Intralum. Diam. (cm)Waveform  Comments  +-----------------------+----------+--------------------+---------+--------  +  Brachial Antecub. fossa77        0.50                triphasic           +-----------------------+----------+--------------------+---------+--------  +  Radial Art at Wrist    59        0.26                triphasic           +-----------------------+----------+--------------------+---------+--------  +  Ulnar Art at Wrist     80        0.21                triphasic           +-----------------------+----------+--------------------+---------+--------  +    Left Pre-Dialysis Findings:  +-----------------------+----------+--------------------+---------+--------  +  Location              PSV (cm/s)Intralum. Diam. (cm)Waveform  Comments  +-----------------------+----------+--------------------+---------+--------  +  Brachial Antecub. fossa57        0.53                triphasic           +-----------------------+----------+--------------------+---------+--------  +  Radial Art at Wrist    84        0.25                triphasic           +-----------------------+----------+--------------------+---------+--------  +  Ulnar Art at Wrist     87        0.20                triphasic           +-----------------------+----------+--------------------+---------+--------    Vein mapping +-----------------+-------------+----------+---------+   Right Cephalic   Diameter (cm)Depth (cm)Findings   +-----------------+-------------+----------+---------+  Shoulder            0.30        1.05              +-----------------+-------------+----------+---------+  Prox upper arm       0.29  0.64   branching  +-----------------+-------------+----------+---------+  Mid upper arm        0.27        0.59              +-----------------+-------------+----------+---------+  Dist upper arm       0.31        0.64              +-----------------+-------------+----------+---------+  Antecubital fossa    0.30        0.20              +-----------------+-------------+----------+---------+  Prox forearm         0.27        0.23              +-----------------+-------------+----------+---------+  Mid forearm          0.27        0.21              +-----------------+-------------+----------+---------+  Wrist               0.26        0.34   branching  +-----------------+-------------+----------+---------+   +-----------------+-------------+----------+--------------+  Right Basilic    Diameter (cm)Depth (cm)   Findings     +-----------------+-------------+----------+--------------+  Shoulder                               not visualized  +-----------------+-------------+----------+--------------+  Prox upper arm                          not visualized  +-----------------+-------------+----------+--------------+  Mid upper arm        0.42        1.39                   +-----------------+-------------+----------+--------------+  Dist upper arm       0.39        0.58     branching     +-----------------+-------------+----------+--------------+  Antecubital fossa    0.19        0.49                   +-----------------+-------------+----------+--------------+  Prox forearm         0.17        0.23                    +-----------------+-------------+----------+--------------+  Mid forearm          0.12        0.23     branching     +-----------------+-------------+----------+--------------+  Wrist                                  not visualized  +-----------------+-------------+----------+--------------+   +-----------------+-------------+----------+---------+  Left Cephalic    Diameter (cm)Depth (cm)Findings   +-----------------+-------------+----------+---------+  Shoulder            0.26        1.56              +-----------------+-------------+----------+---------+  Prox upper arm       0.28        0.63   branching  +-----------------+-------------+----------+---------+  Mid upper arm        0.21        0.76              +-----------------+-------------+----------+---------+  Dist upper arm       0.28        0.71   branching  +-----------------+-------------+----------+---------+  Antecubital fossa    0.24        0.57   branching  +-----------------+-------------+----------+---------+  Prox forearm         0.16        0.29              +-----------------+-------------+----------+---------+  Mid forearm          0.23        0.37              +-----------------+-------------+----------+---------+  Wrist               0.32        0.37   branching  +-----------------+-------------+----------+---------+   +-----------------+-------------+----------+--------------+  Left Basilic     Diameter (cm)Depth (cm)   Findings     +-----------------+-------------+----------+--------------+  Shoulder                               not visualized  +-----------------+-------------+----------+--------------+  Prox upper arm                          not visualized  +-----------------+-------------+----------+--------------+  Mid upper arm        0.46        1.06                    +-----------------+-------------+----------+--------------+  Dist upper arm       0.32        0.62     branching     +-----------------+-------------+----------+--------------+  Antecubital fossa    0.29        0.51     branching     +-----------------+-------------+----------+--------------+  Prox forearm                            not visualized  +-----------------+-------------+----------+--------------+  Mid forearm                             not visualized  +-----------------+-------------+----------+--------------+  Wrist                                  not visualized  +-----------------+-------------+----------+--------------+   Note from nephrologist reviewed CKD stage IV, please fistula, hold for graft      Assessment/Plan:     JORDANNY WADDINGTON is a 48 y.o. male with CKD stage IV who presents to discuss permanent access creation.  Dominant hand: right Previous access surgeries: none Previous catheters: none Other arm surgeries/injuries: denies  The vein mapping suggests there is a suitable left cephalic vein and we discussed that they are a candidate for AVF  The risks an benefits including of access creation were reviewed including: need for additional procedures, need for additional creations, steal, ischemia monomelic neuropathy, failure of access, and bleeding. The patient expressed understand and is willing to proceed.  I explained that I will perform intraoperative vein mapping to confirm the above findings and will determine the most appropriate access to create but with a preoperative plan for left arm brachiocephalic AVF.     Philipp Brawn MD [facility]

## 2023-10-26 ENCOUNTER — Ambulatory Visit (HOSPITAL_COMMUNITY)
Admission: RE | Admit: 2023-10-26 | Discharge: 2023-10-26 | Disposition: A | Discharge status: Home / Self Care | Source: Ambulatory Visit | Attending: Vascular Surgery | Admitting: Vascular Surgery

## 2023-10-26 ENCOUNTER — Ambulatory Visit (HOSPITAL_COMMUNITY)
Admission: RE | Admit: 2023-10-26 | Discharge: 2023-10-26 | Disposition: A | Discharge status: Home / Self Care | Source: Ambulatory Visit | Attending: Vascular Surgery

## 2023-10-26 ENCOUNTER — Encounter: Payer: Self-pay | Admitting: Vascular Surgery

## 2023-10-26 ENCOUNTER — Ambulatory Visit (INDEPENDENT_AMBULATORY_CARE_PROVIDER_SITE_OTHER): Admitting: Vascular Surgery

## 2023-10-26 ENCOUNTER — Other Ambulatory Visit: Payer: Self-pay

## 2023-10-26 VITALS — BP 138/85 | HR 61 | Temp 98.0°F | Resp 20 | Ht 69.0 in | Wt 259.8 lb

## 2023-10-26 DIAGNOSIS — N184 Chronic kidney disease, stage 4 (severe): Secondary | ICD-10-CM | POA: Diagnosis not present

## 2023-10-26 DIAGNOSIS — N186 End stage renal disease: Secondary | ICD-10-CM

## 2023-11-07 ENCOUNTER — Other Ambulatory Visit: Payer: Self-pay

## 2023-11-07 ENCOUNTER — Encounter (HOSPITAL_COMMUNITY): Payer: Self-pay | Admitting: Vascular Surgery

## 2023-11-07 NOTE — Anesthesia Preprocedure Evaluation (Addendum)
 Anesthesia Evaluation  Patient identified by MRN, date of birth, ID band Patient awake    Reviewed: Allergy & Precautions, NPO status , Patient's Chart, lab work & pertinent test results  History of Anesthesia Complications Negative for: history of anesthetic complications  Airway Mallampati: III  TM Distance: >3 FB Neck ROM: Full    Dental  (+) Dental Advisory Given   Pulmonary neg shortness of breath, sleep apnea , neg COPD, neg recent URI, Current Smoker and Patient abstained from smoking.   breath sounds clear to auscultation       Cardiovascular hypertension, (-) angina (-) Past MI  Rhythm:Regular     Neuro/Psych negative neurological ROS     GI/Hepatic negative GI ROS, Neg liver ROS,,,  Endo/Other  diabetes  Lab Results      Component                Value               Date                      HGBA1C                   5.5                 08/15/2023             Renal/GU CRFRenal diseaseLab Results      Component                Value               Date                      NA                       144                 11/08/2023                K                        4.3                 11/08/2023                CO2                      20                  08/15/2023                GLUCOSE                  103 (H)             11/08/2023                BUN                      33 (H)              11/08/2023                CREATININE               3.90 (H)  11/08/2023                CALCIUM                   9.2                 08/15/2023                GFR                      28.23 (L)           01/07/2020                EGFR                     21 (L)              08/15/2023                GFRNONAA                 24 (L)              12/13/2021                Musculoskeletal negative musculoskeletal ROS (+)    Abdominal   Peds  Hematology  (+) Blood dyscrasia, anemia Lab Results      Component                 Value               Date                      WBC                      7.4                 08/15/2023                HGB                      11.6 (L)            11/08/2023                HCT                      34.0 (L)            11/08/2023                MCV                      94                  08/15/2023                PLT                      274                 08/15/2023              Anesthesia Other Findings   Reproductive/Obstetrics                             Anesthesia Physical Anesthesia  Plan  ASA: 3  Anesthesia Plan: MAC and Regional   Post-op Pain Management: Regional block*   Induction: Intravenous  PONV Risk Score and Plan: 0 and Propofol  infusion  Airway Management Planned: Nasal Cannula, Natural Airway and Simple Face Mask  Additional Equipment: None  Intra-op Plan:   Post-operative Plan:   Informed Consent: I have reviewed the patients History and Physical, chart, labs and discussed the procedure including the risks, benefits and alternatives for the proposed anesthesia with the patient or authorized representative who has indicated his/her understanding and acceptance.     Dental advisory given  Plan Discussed with: CRNA  Anesthesia Plan Comments: (PAT note by Lynwood Hope, PA-C:  48 yo male with pertinent hx including HTN, class 3 obesity on semaglutide , polycystic kidney disease, CKD IV not yet on HD.   Pt being evaluated at [facility] for renal transplant. Echo 05/2023 showed EF 60-65%, normal RV, normal valves. Nuclear stress test 05/2023 was interpreted as abnormal. However, in followup with cardiologist Dr. Toribio on 06/15/23 he stated, Abnormal nuclear stress test. In the course of this patient's evaluation for possible kidney transplant, he underwent a nuclear stress test which was interpreted as abnormal. I personally reviewed the images from this test to the ability that I am able to do so (image manipulation  is not facilitated with current technology in our institution). The nuclear perfusion results at rest and stress look normal to me. It was discussed he may ultimately need cath to pursue  renal transplant, but this has not yet been arranged.   He will need DOS labs and eval.   EKG 06/15/23 (care everywhere):   TTE 05/18/23 (care everywhere): SUMMARY  The left ventricular size is normal with normal left ventricular wall thickness.  Left ventricular systolic function is normal.  LV ejection fraction = 60-65%.  The right ventricle is normal in size and function.  There is no significant valvular stenosis or regurgitation.  There is no comparison study available.   Nuclear stress test 06/06/23 (care everywhere): 1.) LIMITATIONS: None.  2.) MYOCARDIAL PERFUSION: Small mild intensity infarct with mild surrounding ischemia in the mid-anterior wall.  3.) LEFT VENTRICULAR EJECTION FRACTION: Normal.  4.) REGIONAL WALL MOTION: Normal.    )        Anesthesia Quick Evaluation

## 2023-11-07 NOTE — Pre-Procedure Instructions (Signed)
 -------------  SDW INSTRUCTIONS given:  Your procedure is scheduled on 6/26.  Report to [facility] Main Entrance A at 07:50 A.M., and check in at the Admitting office.  Any questions or running late day of surgery: call [phone]    Remember:  Do not eat or drink after midnight the night before your surgery     Take these medicines the morning of surgery with A SIP OF WATER  amlodipine , atorvastatin , gabapentin , hydralazine , jynarque , metoprolol , zoloft                              May take these medicines IF NEEDED: tramadol     As of today, STOP taking any Aspirin  (unless otherwise instructed by your surgeon) Aleve, Naproxen, Ibuprofen, Motrin, Advil, Goody's, BC's, all herbal medications, fish oil, and all vitamins.  WHAT DO I DO ABOUT MY DIABETES MEDICATION?   STOP taking Semaglutide  7 days prior to surgery. Last dose 6/18.    HOW TO MANAGE YOUR DIABETES BEFORE AND AFTER SURGERY  Why is it important to control my blood sugar before and after surgery? Improving blood sugar levels before and after surgery helps healing and can limit problems. A way of improving blood sugar control is eating a healthy diet by:  Eating less sugar and carbohydrates  Increasing activity/exercise  Talking with your doctor about reaching your blood sugar goals High blood sugars (greater than 180 mg/dL) can raise your risk of infections and slow your recovery, so you will need to focus on controlling your diabetes during the weeks before surgery. Make sure that the doctor who takes care of your diabetes knows about your planned surgery including the date and location.  How do I manage my blood sugar before surgery? Check your blood sugar at least 4 times a day, starting 2 days before surgery, to make sure that the level is not too high or low.  Check your blood sugar the morning of your surgery when you wake up and every 2 hours until you get to the Short Stay unit.  If your blood sugar is  less than 70 mg/dL, you will need to treat for low blood sugar: Do not take insulin . Treat a low blood sugar (less than 70 mg/dL) with  cup of clear juice (cranberry or apple), 4 glucose tablets, OR glucose gel. Recheck blood sugar in 15 minutes after treatment (to make sure it is greater than 70 mg/dL). If your blood sugar is not greater than 70 mg/dL on recheck, call [phone] for further instructions. Report your blood sugar to the short stay nurse when you get to Short Stay.  If you are admitted to the hospital after surgery: Your blood sugar will be checked by the staff and you will probably be given insulin  after surgery (instead of oral diabetes medicines) to make sure you have good blood sugar levels. The goal for blood sugar control after surgery is 80-180 mg/dL.   Do NOT Smoke (Tobacco/Vaping) 24 hours prior to your procedure  If you use a CPAP at night, you may bring all equipment for your overnight stay.     You will be asked to remove any contacts, glasses, piercing's, hearing aid's, dentures/partials prior to surgery. Please bring cases for these items if needed.     Patients discharged the day of surgery will not be allowed to drive home, and someone needs to stay with them for 24 hours.  SURGICAL WAITING ROOM VISITATION Patients may  have no more than 2 support people in the waiting area - these visitors may rotate.   Pre-op  nurse will coordinate an appropriate time for 1 ADULT support person, who may not rotate, to accompany patient in pre-op .  Children under the age of 20 must have an adult with them who is not the patient and must remain in the main waiting area with an adult.  If the patient needs to stay at the hospital during part of their recovery, the visitor guidelines for inpatient rooms apply.  Please refer to the [facility] website for the visitor guidelines for any additional information.   Special instructions:   [place]- Preparing For  Surgery   Please follow these instructions carefully.   Shower the NIGHT BEFORE SURGERY and the MORNING OF SURGERY with DIAL Soap.   Pat yourself dry with a CLEAN TOWEL.  Wear CLEAN PAJAMAS to bed the night before surgery  Place CLEAN SHEETS on your bed the night of your first shower and DO NOT SLEEP WITH PETS.   Additional instructions for the day of surgery: DO NOT APPLY any lotions, deodorants, cologne, or perfumes.   Do not wear jewelry or makeup Do not wear nail polish, gel polish, artificial nails, or any other type of covering on natural nails (fingers and toes) Do not bring valuables to the hospital. [facility] is not responsible for valuables/personal belongings. Put on clean/comfortable clothes.  Please brush your teeth.  Ask your nurse before applying any prescription medications to the skin.

## 2023-11-07 NOTE — Progress Notes (Addendum)
 Anesthesia Chart Review: Same day workup  48 yo male with pertinent hx including HTN, class 3 obesity on semaglutide , polycystic kidney disease, CKD IV not yet on HD.   Pt being evaluated at [facility] for renal transplant. Echo 05/2023 showed EF 60-65%, normal RV, normal valves. Nuclear stress test 05/2023 was interpreted as abnormal. However, in followup with cardiologist Dr. Toribio on 06/15/23 he stated, Abnormal nuclear stress test. In the course of this patient's evaluation for possible kidney transplant, he underwent a nuclear stress test which was interpreted as abnormal. I personally reviewed the images from this test to the ability that I am able to do so (image manipulation is not facilitated with current technology in our institution). The nuclear perfusion results at rest and stress look normal to me. It was discussed he may ultimately need cath to pursue  renal transplant, but this has not yet been arranged.   He will need DOS labs and eval.   EKG 06/15/23 (care everywhere):   TTE 05/18/23 (care everywhere): SUMMARY  The left ventricular size is normal with normal left ventricular wall thickness.  Left ventricular systolic function is normal.  LV ejection fraction = 60-65%.  The right ventricle is normal in size and function.  There is no significant valvular stenosis or regurgitation.  There is no comparison study available.   Nuclear stress test 06/06/23 (care everywhere): 1.) LIMITATIONS: None.  2.) MYOCARDIAL PERFUSION: Small mild intensity infarct with mild surrounding ischemia in the mid-anterior wall.  3.) LEFT VENTRICULAR EJECTION FRACTION: Normal.  4.) REGIONAL WALL MOTION: Normal.     Lynwood Geofm RIGGERS [facility] Short Stay Center/Anesthesiology Phone [phone] 11/07/2023 1:04 PM

## 2023-11-07 NOTE — Progress Notes (Addendum)
 PCP - Bascom Borer, NP Cardiologist - denies  PPM/ICD - denies   Chest x-ray - 03/14/23 EKG - 06/15/23- requested EKG tracing Stress Test - 06/14/23 ECHO - 02/29/16 Cardiac Cath - denies  CPAP - OSA+, CPAP nightly, unsure of pressure settings  Fasting Blood Sugar - 110-120 Checks Blood Sugar once/week  ASA/Blood Thinner Instructions: n/a  Semaglutide - last dose 6/17.  ERAS Protcol - NPO  COVID TEST- n/a  Anesthesia review: yes, EKG requested, positive stress test  Patient verbally denies any shortness of breath, fever, cough and chest pain during phone call      Questions were answered. Patient verbalized understanding of instructions.

## 2023-11-08 ENCOUNTER — Encounter (HOSPITAL_COMMUNITY): Payer: Self-pay | Admitting: Vascular Surgery

## 2023-11-08 ENCOUNTER — Other Ambulatory Visit: Payer: Self-pay

## 2023-11-08 ENCOUNTER — Ambulatory Visit (HOSPITAL_COMMUNITY)
Admission: RE | Admit: 2023-11-08 | Discharge: 2023-11-08 | Disposition: A | Discharge status: Home / Self Care | Attending: Vascular Surgery | Admitting: Vascular Surgery

## 2023-11-08 ENCOUNTER — Ambulatory Visit (HOSPITAL_COMMUNITY): Payer: Self-pay | Admitting: Physician Assistant

## 2023-11-08 ENCOUNTER — Encounter (HOSPITAL_COMMUNITY)
Admission: RE | Disposition: A | Discharge status: Home / Self Care | Payer: Self-pay | Source: Home / Self Care | Attending: Vascular Surgery

## 2023-11-08 DIAGNOSIS — N185 Chronic kidney disease, stage 5: Secondary | ICD-10-CM

## 2023-11-08 DIAGNOSIS — E1122 Type 2 diabetes mellitus with diabetic chronic kidney disease: Secondary | ICD-10-CM | POA: Insufficient documentation

## 2023-11-08 DIAGNOSIS — Z7985 Long-term (current) use of injectable non-insulin antidiabetic drugs: Secondary | ICD-10-CM | POA: Diagnosis not present

## 2023-11-08 DIAGNOSIS — E66813 Obesity, class 3: Secondary | ICD-10-CM | POA: Diagnosis not present

## 2023-11-08 DIAGNOSIS — I120 Hypertensive chronic kidney disease with stage 5 chronic kidney disease or end stage renal disease: Secondary | ICD-10-CM | POA: Diagnosis not present

## 2023-11-08 DIAGNOSIS — N186 End stage renal disease: Secondary | ICD-10-CM | POA: Diagnosis not present

## 2023-11-08 DIAGNOSIS — Q613 Polycystic kidney, unspecified: Secondary | ICD-10-CM | POA: Diagnosis not present

## 2023-11-08 DIAGNOSIS — F17210 Nicotine dependence, cigarettes, uncomplicated: Secondary | ICD-10-CM

## 2023-11-08 DIAGNOSIS — Z6837 Body mass index (BMI) 37.0-37.9, adult: Secondary | ICD-10-CM | POA: Insufficient documentation

## 2023-11-08 HISTORY — DX: Sleep apnea, unspecified: G47.30

## 2023-11-08 HISTORY — PX: AV FISTULA PLACEMENT: SHX1204

## 2023-11-08 LAB — GLUCOSE, CAPILLARY
Glucose-Capillary: 122 mg/dL — ABNORMAL HIGH (ref 70–99)
Glucose-Capillary: 113 mg/dL — ABNORMAL HIGH (ref 70–99)
Glucose-Capillary: 99 mg/dL (ref 70–99)

## 2023-11-08 LAB — SURGICAL PCR SCREEN
MRSA, PCR: POSITIVE — AB
Staphylococcus aureus: POSITIVE — AB

## 2023-11-08 LAB — POCT I-STAT, CHEM 8
BUN: 33 mg/dL — ABNORMAL HIGH (ref 6–20)
Calcium, Ion: 1.19 mmol/L (ref 1.15–1.40)
Chloride: 112 mmol/L — ABNORMAL HIGH (ref 98–111)
Creatinine, Ser: 3.9 mg/dL — ABNORMAL HIGH (ref 0.61–1.24)
Glucose, Bld: 103 mg/dL — ABNORMAL HIGH (ref 70–99)
HCT: 34 % — ABNORMAL LOW (ref 39.0–52.0)
Hemoglobin: 11.6 g/dL — ABNORMAL LOW (ref 13.0–17.0)
Potassium: 4.3 mmol/L (ref 3.5–5.1)
Sodium: 144 mmol/L (ref 135–145)
TCO2: 20 mmol/L — ABNORMAL LOW (ref 22–32)

## 2023-11-08 SURGERY — ARTERIOVENOUS (AV) FISTULA CREATION
Anesthesia: Choice | Laterality: Left

## 2023-11-08 MED ORDER — SODIUM CHLORIDE 0.9 % IV SOLN: INTRAVENOUS | Status: DC

## 2023-11-08 MED ORDER — LIDOCAINE-EPINEPHRINE (PF) 1.5 %-1:200000 IJ SOLN
INTRAMUSCULAR | Status: DC | PRN
  Administered 2023-11-08: 15 mg via PERINEURAL

## 2023-11-08 MED ORDER — HEPARIN 6000 UNIT IRRIGATION SOLUTION
Status: DC | PRN
  Administered 2023-11-08: 1

## 2023-11-08 MED ORDER — CHLORHEXIDINE GLUCONATE 0.12 % MT SOLN
OROMUCOSAL | Status: AC
  Administered 2023-11-08: 15 mL via OROMUCOSAL
  Filled 2023-11-08: qty 15

## 2023-11-08 MED ORDER — ORAL CARE MOUTH RINSE: 15.0000 mL | Freq: Once | OROMUCOSAL | Status: AC

## 2023-11-08 MED ORDER — OXYCODONE-ACETAMINOPHEN 5-325 MG PO TABS: 1.0000 | ORAL_TABLET | Freq: Four times a day (QID) | ORAL | 0 refills | Status: DC | PRN

## 2023-11-08 MED ORDER — CEFAZOLIN SODIUM-DEXTROSE 2-4 GM/100ML-% IV SOLN
2.0000 g | INTRAVENOUS | Status: AC
  Administered 2023-11-08: 2 g via INTRAVENOUS
  Filled 2023-11-08: qty 100

## 2023-11-08 MED ORDER — 0.9 % SODIUM CHLORIDE (POUR BTL) OPTIME
TOPICAL | Status: DC | PRN
  Administered 2023-11-08: 1000 mL

## 2023-11-08 MED ORDER — LIDOCAINE 2% (20 MG/ML) 5 ML SYRINGE
INTRAMUSCULAR | Status: DC | PRN
  Administered 2023-11-08: 60 mg via INTRAVENOUS

## 2023-11-08 MED ORDER — SODIUM CHLORIDE 0.9% FLUSH: 3.0000 mL | INTRAVENOUS | Status: DC | PRN

## 2023-11-08 MED ORDER — INSULIN ASPART 100 UNIT/ML IJ SOLN: 0.0000 [IU] | INTRAMUSCULAR | Status: DC | PRN

## 2023-11-08 MED ORDER — PROPOFOL 500 MG/50ML IV EMUL
INTRAVENOUS | Status: DC | PRN
  Administered 2023-11-08: 100 ug/kg/min via INTRAVENOUS

## 2023-11-08 MED ORDER — HEPARIN SODIUM (PORCINE) 1000 UNIT/ML IJ SOLN
INTRAMUSCULAR | Status: DC | PRN
  Administered 2023-11-08: 3000 [IU] via INTRAVENOUS

## 2023-11-08 MED ORDER — HEMOSTATIC AGENTS (NO CHARGE) OPTIME
TOPICAL | Status: DC | PRN
  Administered 2023-11-08: 1 via TOPICAL

## 2023-11-08 MED ORDER — CHLORHEXIDINE GLUCONATE 4 % EX SOLN: 60.0000 mL | Freq: Once | CUTANEOUS | Status: DC

## 2023-11-08 MED ORDER — CHLORHEXIDINE GLUCONATE 0.12 % MT SOLN: 15.0000 mL | Freq: Once | OROMUCOSAL | Status: AC

## 2023-11-08 MED ORDER — HEPARIN 6000 UNIT IRRIGATION SOLUTION
Status: AC
Start: 2023-11-08 — End: 2023-11-08
  Filled 2023-11-08: qty 500

## 2023-11-08 MED ORDER — MEPIVACAINE HCL (PF) 2 % IJ SOLN
INTRAMUSCULAR | Status: DC | PRN
Start: 2023-11-08 — End: 2023-11-08
  Administered 2023-11-08: 10 mL

## 2023-11-08 MED ORDER — FENTANYL CITRATE (PF) 100 MCG/2ML IJ SOLN
INTRAMUSCULAR | Status: AC
  Administered 2023-11-08: 50 ug via INTRAVENOUS
  Filled 2023-11-08: qty 2

## 2023-11-08 MED ORDER — LIDOCAINE 2% (20 MG/ML) 5 ML SYRINGE
INTRAMUSCULAR | Status: AC
Start: 2023-11-08 — End: 2023-11-08
  Filled 2023-11-08: qty 5

## 2023-11-08 MED ORDER — MIDAZOLAM HCL 2 MG/2ML IJ SOLN
INTRAMUSCULAR | Status: AC
  Filled 2023-11-08: qty 2

## 2023-11-08 MED ORDER — FENTANYL CITRATE (PF) 100 MCG/2ML IJ SOLN: 50.0000 ug | Freq: Once | INTRAMUSCULAR | Status: AC

## 2023-11-08 SURGICAL SUPPLY — 30 items
ARMBAND PINK RESTRICT EXTREMIT (MISCELLANEOUS) ×1 IMPLANT
BAG COUNTER SPONGE SURGICOUNT (BAG) ×1 IMPLANT
CANISTER SUCTION 3000ML PPV (SUCTIONS) ×1 IMPLANT
CLIP TI MEDIUM 6 (CLIP) ×1 IMPLANT
CLIP TI WIDE RED SMALL 6 (CLIP) ×1 IMPLANT
COVER PROBE W GEL 5X96 (DRAPES) IMPLANT
DERMABOND ADVANCED .7 DNX12 (GAUZE/BANDAGES/DRESSINGS) ×1 IMPLANT
ELECTRODE REM PT RTRN 9FT ADLT (ELECTROSURGICAL) ×1 IMPLANT
GLOVE BIOGEL PI IND STRL 7.0 (GLOVE) ×1 IMPLANT
GLOVE INDICATOR 7.0 STRL GRN (GLOVE) IMPLANT
GLOVE SS BIOGEL STRL SZ 6.5 (GLOVE) IMPLANT
GOWN STRL REUS W/ TWL LRG LVL3 (GOWN DISPOSABLE) ×2 IMPLANT
GOWN STRL REUS W/ TWL XL LVL3 (GOWN DISPOSABLE) ×1 IMPLANT
HEMOSTAT SNOW SURGICEL 2X4 (HEMOSTASIS) IMPLANT
INSERT FOGARTY SM (MISCELLANEOUS) IMPLANT
KIT BASIN OR (CUSTOM PROCEDURE TRAY) ×1 IMPLANT
KIT TURNOVER KIT B (KITS) ×1 IMPLANT
LOOP VESSEL MINI RED (MISCELLANEOUS) IMPLANT
NS IRRIG 1000ML POUR BTL (IV SOLUTION) ×1 IMPLANT
PACK CV ACCESS (CUSTOM PROCEDURE TRAY) ×1 IMPLANT
PAD ARMBOARD POSITIONER FOAM (MISCELLANEOUS) ×2 IMPLANT
POWDER SURGICEL 3.0 GRAM (HEMOSTASIS) IMPLANT
SLING ARM FOAM STRAP LRG (SOFTGOODS) IMPLANT
SUT MNCRL AB 4-0 PS2 18 (SUTURE) ×1 IMPLANT
SUT PROLENE 6 0 BV (SUTURE) ×1 IMPLANT
SUT VIC AB 3-0 SH 27X BRD (SUTURE) ×1 IMPLANT
TOWEL GREEN STERILE (TOWEL DISPOSABLE) ×1 IMPLANT
UNDERPAD 30X36 HEAVY ABSORB (UNDERPADS AND DIAPERS) ×1 IMPLANT
VASCULAR TIE MINI RED 18IN STL (MISCELLANEOUS) IMPLANT
WATER STERILE IRR 1000ML POUR (IV SOLUTION) ×1 IMPLANT

## 2023-11-08 NOTE — Discharge Instructions (Signed)
 [facility]  Discharge Instructions  AV Fistula or Graft Surgery for Dialysis Access  Please refer to the following instructions for your post-procedure care. Your surgeon or physician assistant will discuss any changes with you.  Activity  You may drive the day following your surgery, if you are comfortable and no longer taking prescription pain medication. Resume full activity as the soreness in your incision resolves.  Bathing/Showering  You may shower after you go home. Keep your incision dry for 48 hours. Do not soak in a bathtub, hot tub, or swim until the incision heals completely. You may not shower if you have a hemodialysis catheter.  Incision Care  Clean your incision with mild soap and water after 48 hours. Pat the area dry with a clean towel. You do not need a bandage unless otherwise instructed. Do not apply any ointments or creams to your incision. You may have skin glue on your incision. Do not peel it off. It will come off on its own in about one week. Your arm may swell a bit after surgery. To reduce swelling use pillows to elevate your arm so it is above your heart. Your doctor will tell you if you need to lightly wrap your arm with an ACE bandage.  Diet  Resume your normal diet. There are not special food restrictions following this procedure. In order to heal from your surgery, it is CRITICAL to get adequate nutrition. Your body requires vitamins, minerals, and protein. Vegetables are the best source of vitamins and minerals. Vegetables also provide the perfect balance of protein. Processed food has little nutritional value, so try to avoid this.  Medications  Resume taking all of your medications. If your incision is causing pain, you may take over-the counter pain relievers such as acetaminophen  (Tylenol ). If you were prescribed a stronger pain medication, please be aware these medications can cause nausea and constipation. Prevent  nausea by taking the medication with a snack or meal. Avoid constipation by drinking plenty of fluids and eating foods with high amount of fiber, such as fruits, vegetables, and grains.  Do not take Tylenol  if you are taking prescription pain medications.  Follow up Your surgeon may want to see you in the office following your access surgery. If so, this will be arranged at the time of your surgery.  Please call us  immediately for any of the following conditions:  Increased pain, redness, drainage (pus) from your incision site Fever of 101 degrees or higher Severe or worsening pain at your incision site Hand pain or numbness.  Reduce your risk of vascular disease:  Stop smoking. If you would like help, call [facility] at 1-800-QUIT-NOW ((820)443-3659) or [place] at (223)474-5003  Manage your cholesterol Maintain a desired weight Control your diabetes Keep your blood pressure down  Dialysis  It will take several weeks to several months for your new dialysis access to be ready for use. Your surgeon will determine when it is okay to use it. Your nephrologist will continue to direct your dialysis. You can continue to use your Permcath until your new access is ready for use.   11/08/2023 Kevin Todd [id number] Apr 16, 1976  Surgeon(s): Pearline Norman RAMAN, MD  Procedure(s): ARTERIOVENOUS (AV) FISTULA CREATION   May stick graft immediately   May stick graft on designated area only:   X Do not stick left AV fistula for 12 weeks    If you have any questions, please call the office at [phone].

## 2023-11-08 NOTE — Anesthesia Procedure Notes (Addendum)
 Anesthesia Regional Block: Supraclavicular block   Pre-Anesthetic Checklist: , timeout performed,  Correct Patient, Correct Site, Correct Laterality,  Correct Procedure, Correct Position, site marked,  Risks and benefits discussed,  Surgical consent,  Pre-op  evaluation,  At surgeon's request and post-op pain management  Laterality: Upper and Left  Prep: chloraprep       Needles:  Injection technique: Single-shot      Needle Length: 5cm  Needle Gauge: 22     Additional Needles: Arrow StimuQuik ECHO Echogenic Stimulating PNB Needle  Procedures:,,,, ultrasound used (permanent image in chart),,    Narrative:  Start time: 11/08/2023 10:32 AM End time: 11/08/2023 10:40 AM Injection made incrementally with aspirations every 5 mL.  Performed by: Personally  Anesthesiologist: Leopoldo Bruckner, MD

## 2023-11-08 NOTE — Transfer of Care (Signed)
 Immediate Anesthesia Transfer of Care Note  Patient: Kevin Todd  Procedure(s) Performed: BRACHIOCEPHALIC ARTERIOVENOUS (AV) FISTULA CREATION (Left)  Patient Location: PACU  Anesthesia Type:MAC combined with regional for post-op pain  Level of Consciousness: awake and alert   Airway & Oxygen Therapy: Patient Spontanous Breathing  Post-op Assessment: Report given to RN and Post -op Vital signs reviewed and stable  Post vital signs: Reviewed and stable  Last Vitals:  Vitals Value Taken Time  BP 157/99 11/08/23 13:03  Temp    Pulse 71 11/08/23 13:04  Resp 18 11/08/23 13:04  SpO2 93 % 11/08/23 13:04  Vitals shown include unfiled device data.  Last Pain:  Vitals:   11/08/23 1045  PainSc: 0-No pain         Complications: No notable events documented.

## 2023-11-08 NOTE — Progress Notes (Signed)
 Orthopedic Tech Progress Note Patient Details:  Kevin Todd 1975-05-30 [id number] Left arm sling for patient at bedside as instructed by RN. RN stated she would apply it when the patient was dressed and ready to go home  Ortho Devices Type of Ortho Device: Arm sling Ortho Device/Splint Location: LUE Ortho Device/Splint Interventions: Ordered   Post Interventions Instructions Provided: Adjustment of device, Care of device  [name] December 11/08/2023, 1:46 PM

## 2023-11-08 NOTE — Interval H&P Note (Signed)
 History and Physical Interval Note:  11/08/2023 10:16 AM  Kevin Todd  has presented today for surgery, with the diagnosis of ESRD.  The various methods of treatment have been discussed with the patient and family. After consideration of risks, benefits and other options for treatment, the patient has consented to  Procedure(s): ARTERIOVENOUS (AV) FISTULA CREATION (Left) as a surgical intervention.  The patient's history has been reviewed, patient examined, no change in status, stable for surgery.  I have reviewed the patient's chart and labs.  Questions were answered to the patient's satisfaction.     Norman GORMAN Serve

## 2023-11-08 NOTE — Op Note (Signed)
    OPERATIVE NOTE  PROCEDURE:   Intraoperative left arm vein mapping left arm brachiocephalic AVF creation  PRE-OPERATIVE DIAGNOSIS: CKD stage V  POST-OPERATIVE DIAGNOSIS: same as above   SURGEON: Norman GORMAN Serve MD  ASSISTANT(S): Curry Damme, PA  Given the complexity of the case,  the assistant was necessary in order to expedient the procedure and safely perform the technical aspects of the operation.  The assistant provided traction and countertraction to assist with exposure of the artery and vein.  They also assisted with suture ligation of multiple venous branches.  They played a critical role in the anastomosis. These skills, especially following the Prolene suture for the anastomosis, could not have been adequately performed by a scrub tech assistant.   ANESTHESIA: regional  ESTIMATED BLOOD LOSS: 20 cc  FINDING(S):  sized left arm cephalic vein throughout its course on ultrasound but was moderately smaller in the AC fossa. Sufficiently sized left brachial artery Doppler thrill in AVF with multiphasic radial artery on completion  Overall, marginal left BC AVF due to small size in [facility] fossa  SPECIMEN(S):  none  INDICATIONS:   Kevin Todd is a 48 y.o. male with CKD stage V. The patient is not currently on dialysis. They were seen in the office for evaluation of hemodialysis access. The risks and benefits of access creation were reviewed including: need for additional procedures, need for additional creations, steal, ischemia monomelic neuropathy, failure of access, and bleeding. The patient expressed understand and is willing to proceed.    DESCRIPTION: The patient was brought to the operating room positioned supine on the operating table.  The left arm was prepped and draped in usual sterile fashion.  Timeout was performed and preoperative antibiotics were administered.  The case began with ultrasound mapping of the brachial artery and cephalic vein, which demonstrated  sufficient size for arteriovenous fistula although was smaller under direct visualization.  A transverse incision was made 1 finger breadth below the elbow creese in the antecubital fossa. The  cephalic vein was isolated for 3 cm in length which was noted to be smaller at the [facility] fossa. Next the aponeurosis was partially released and the brachial artery secured with a vessel loop. The patient was heparinized. The cephalic vein was transected and ligated distally with a 2-0 silk and vascular clip. The vein was dilated and flushed with heparin  saline. Vascular clamps were placed proximally and distally on the brachial artery and an approximate 6 mm arteriotomy was created on the brachial artery. This was flushed with heparin  saline.  An anastomosis was created in end to side fashion on the brachial artery using running 6-0 Prolene suture. Prior to completing the anastomosis, the vessels were flushed and the suture line was tied down. There was an excellent thrill in the cephalic vein from the anastomosis to the mid upper arm. The patient had a multiphasic radial signal and had an excellent doppler signal in the fistula. The incision was irrigated and hemostasis achieved with cautery and suture. The deeper tissue was closed with 3-0 Vicryl and the skin closed with 4-0 Monocryl. Dermabond was applied to the incisions. The patient was transferred to PACU in stable condition.  COMPLICATIONS: none apparent  CONDITION: stable.   Norman GORMAN Serve MD [facility] Phone Number: [phone] 11/08/2023 12:48 PM

## 2023-11-09 ENCOUNTER — Encounter (HOSPITAL_COMMUNITY): Payer: Self-pay | Admitting: Vascular Surgery

## 2023-11-09 NOTE — Anesthesia Postprocedure Evaluation (Signed)
 Anesthesia Post Note  Patient: Kevin Todd  Procedure(s) Performed: BRACHIOCEPHALIC ARTERIOVENOUS (AV) FISTULA CREATION (Left)     Patient location during evaluation: PACU Anesthesia Type: MAC and Regional Level of consciousness: awake and alert Pain management: pain level controlled Vital Signs Assessment: post-procedure vital signs reviewed and stable Respiratory status: spontaneous breathing, nonlabored ventilation and respiratory function stable Cardiovascular status: stable and blood pressure returned to baseline Postop Assessment: no apparent nausea or vomiting Anesthetic complications: no   No notable events documented.                  Demareon Coldwell

## 2023-11-12 ENCOUNTER — Ambulatory Visit (INDEPENDENT_AMBULATORY_CARE_PROVIDER_SITE_OTHER): Admitting: Podiatry

## 2023-11-12 ENCOUNTER — Encounter: Payer: Self-pay | Admitting: Podiatry

## 2023-11-12 DIAGNOSIS — M79674 Pain in right toe(s): Secondary | ICD-10-CM | POA: Diagnosis not present

## 2023-11-12 DIAGNOSIS — B351 Tinea unguium: Secondary | ICD-10-CM

## 2023-11-12 DIAGNOSIS — M79675 Pain in left toe(s): Secondary | ICD-10-CM

## 2023-11-12 DIAGNOSIS — E1122 Type 2 diabetes mellitus with diabetic chronic kidney disease: Secondary | ICD-10-CM

## 2023-11-12 DIAGNOSIS — N184 Chronic kidney disease, stage 4 (severe): Secondary | ICD-10-CM

## 2023-11-12 NOTE — Progress Notes (Signed)
This patient returns to my office for at risk foot care.  This patient requires this care by a professional since this patient will be at risk due to having diabetes with kidney disease.  This patient is unable to cut nails himself since the patient cannot reach his nails.These nails are painful walking and wearing shoes.  This patient presents for at risk foot care today.  General Appearance  Alert, conversant and in no acute stress.  Vascular  Dorsalis pedis and posterior tibial  pulses are palpable  bilaterally.  Capillary return is within normal limits  bilaterally. Temperature is within normal limits  bilaterally.  Neurologic  Senn-Weinstein monofilament wire test within normal limits  bilaterally. Muscle power within normal limits bilaterally.  Nails Thick disfigured discolored nails with subungual debris  from hallux to fifth toes bilaterally. No evidence of bacterial infection or drainage bilaterally.  Orthopedic  No limitations of motion  feet .  No crepitus or effusions noted.  No bony pathology or digital deformities noted.  Skin  normotropic skin with no porokeratosis noted bilaterally.  No signs of infections or ulcers noted.     Onychomycosis  Pain in right toes  Pain in left toes  Consent was obtained for treatment procedures.   Mechanical debridement of nails 1-5  bilaterally performed with a nail nipper.  Filed with dremel without incident.    Return office visit    3 months                  Told patient to return for periodic foot care and evaluation due to potential at risk complications.   Helane Gunther DPM

## 2023-11-13 ENCOUNTER — Encounter: Attending: Physical Medicine & Rehabilitation | Admitting: Physical Medicine & Rehabilitation

## 2023-11-13 ENCOUNTER — Encounter: Payer: Self-pay | Admitting: Physical Medicine & Rehabilitation

## 2023-11-13 VITALS — BP 130/85 | HR 83 | Ht 69.0 in | Wt 251.0 lb

## 2023-11-13 DIAGNOSIS — Z5181 Encounter for therapeutic drug level monitoring: Secondary | ICD-10-CM | POA: Insufficient documentation

## 2023-11-13 DIAGNOSIS — M25551 Pain in right hip: Secondary | ICD-10-CM | POA: Insufficient documentation

## 2023-11-13 DIAGNOSIS — G8929 Other chronic pain: Secondary | ICD-10-CM | POA: Diagnosis present

## 2023-11-13 DIAGNOSIS — M25552 Pain in left hip: Secondary | ICD-10-CM | POA: Insufficient documentation

## 2023-11-13 DIAGNOSIS — Z79891 Long term (current) use of opiate analgesic: Secondary | ICD-10-CM | POA: Diagnosis present

## 2023-11-13 DIAGNOSIS — M5442 Lumbago with sciatica, left side: Secondary | ICD-10-CM | POA: Diagnosis present

## 2023-11-13 MED ORDER — TRAMADOL HCL 50 MG PO TABS: 50.0000 mg | ORAL_TABLET | Freq: Two times a day (BID) | ORAL | 0 refills | Status: DC | PRN

## 2023-11-13 MED ORDER — SERTRALINE HCL 50 MG PO TABS: 50.0000 mg | ORAL_TABLET | Freq: Every day | ORAL | 5 refills | Status: AC

## 2023-11-13 NOTE — Progress Notes (Signed)
 Subjective:    Patient ID: Kevin Todd, male    DOB: 03-02-1976, 48 y.o.   MRN: [id number]  HPI Nerve block a couple of weeks ago. Done in [place]   HPI 02/01/2023  Kevin Todd is a 48 y.o. year old male  who  has a past medical history of Chronic kidney disease, Elevated creatine kinase (09/2019), Hypertension, Iliac crest spur of left hip, Insomnia (10/2019), Pneumothorax on right, Polycystic kidney disease, Sleep apnea, and Vitamin D  deficiency (09/2019).   They are presenting to PM&R clinic as a new patient for pain management evaluation. They were referred by Bascom Borer for treatment of hip pain.  Patient reports he has been having severe hip pain worsening for about 5 to 6 years.  He reports both of his hips hurt however pain is the worst in his left hip.  He also has less severe pain in his lower back.  Pain is worsened with any kind of activity.  He says he cannot sleep due to the pain.  He also cannot sit for too long without the pain worsening.  He often has to change positions.  Walking severely worsens his pain.  He has been following with [facility] and has been given gabapentin .  He is also received IA injections for his bilateral hips without significant benefit reported.  Patient reports he would like to improve his activities so that he could potentially work one day.  He will occasionally have tingling along his left thigh.  Patient reports that orthopedics told him he would have to lose weight before he could be considered for hip replacement.  Patient reports his mood is significantly decreased and would like medication to help this.  He denies SI or HI.   Red flag symptoms: No red flags for back pain endorsed in Hx or ROS  Medications tried: Topical medications denies  Nsaids kidney disease Tylenol   minimal benefit Opiates Tramdol- waiting on insurance to approve  Gabapentin  - Helps a little  TCAs - Denies  SNRIs - Denies    Other treatments: PT/OT   Therapy heped a little TENs unit - Denies Injections  Hip cortisone did not help, appears these were intra-articular injections bilaterally Surgery  Denies   Goals for pain control: Improve his pain so he can be more active     03/01/23 Interval history Patient is here for follow-up regarding his chronic hip pain.  Pain largely unchanged since last visit.  Patient did try tramadol  and oxycodone  previously ordered by PCP.  He reports the tramadol  decreased this pain however the Percocet provided better relief.  Patient reports he only used a few tabs of the Percocet medication since he picked it up a few weeks ago.  He would like to avoid stronger narcotic medications if possible.  Discussed UDS with 83 ng/mL carboxy THC noted.  Patient reported previously that he had been using a edible CBD product.  He reports he is no longer using this product as he was told not to do this last visit.  Patient says he will no longer use this product.  We discussed that on UDS I cannot distinguish these products from marijuana.  04/10/23 Interval History  Patient is here for follow-up regarding his chronic pain.  Patient reports that tramadol  continues to help his pain however he has some days where he feels like it is not strong enough to keep his pain under control.  His prior Percocet 5 mg tabs were better when  pain is particularly severe.  He is not having any side effects with this medication.  TENS unit provides mild relief.  Patient reports he could not have hip nerve ablation due to insurance issue however he says [facility] is still working on this possibility.  06/14/23 Interval History  Patient is here for follow-up regarding treatment of his bilateral hip pain left greater than right.  Left hip continues to be the greatest location of his pain.  He also has lower back pain.  Patient reports he had procedure with [facility] that helped reduce his pain about 50% for about a week.   Patient reports that tramadol  helps reduce his pain but sometimes is not strong enough.  He has not tried using 100 mg dose of tramadol  and uses his medications sparingly.  Reports waiting to hear from [facility]    08/13/23 Interval History  Continued b/l hip pain L>R. He says he has not heard from [facility]. Tramadol  helping reduce his pain.  Reports mood has been decreased and has increased anxiety.   11/13/23 Interval History  Has been having more pain in his left hip and and bilateral lower back.  Pain in his back is largely like a band across his lower back.  Back pain has been worse for about a month.  Patient reports sometimes pain shoots down his left leg.  He had a fistula placed in L arm for potential dialysis.  He reports he going to see [facility] again for his hips and back. He wasn't able to get the nerve block completed due to busy schedule.  Patient has been using tramadol  intermittently.  Reports this does help his pain and usually takes 2 tabs at a time but tries not to use it every day.  Patient requests slightly higher  dose of Zoloft  as mood continues to be decreased at times, sometimes feels irritable.  Patient was given 10 tabs of oxycodone  for the fistula procedure.  Reports this was beneficial but makes him feel little bit off to where he usually stays in the house after he takes it.  Prior UDS results:     Component Value Date/Time   LABOPIA NONE DETECTED 02/28/2016 0315   COCAINSCRNUR NONE DETECTED 02/28/2016 0315   LABBENZ NONE DETECTED 02/28/2016 0315   AMPHETMU NONE DETECTED 02/28/2016 0315   THCU POSITIVE (A) 02/28/2016 0315   LABBARB NONE DETECTED 02/28/2016 0315      Pain Inventory Average Pain 5 Pain Right Now 7 My pain is constant, sharp, stabbing, and throbbing and numbness.  In the last 24 hours, has pain interfered with the following? General activity 5 Relation with others 6 Enjoyment of life 7 What TIME of day is your pain at its worst?  daytime, night, and varies Sleep (in general) Poor  Pain is worse with: walking, bending, and standing Pain improves with: medication Relief from Meds: 8      Family History  Problem Relation Age of Onset   Hypertension Mother    Colon cancer Maternal Grandfather    Stomach cancer Neg Hx    Esophageal cancer Neg Hx    Pancreatic cancer Neg Hx    Social History   Socioeconomic History   Marital status: Single    Spouse name: Not on file   Number of children: Not on file   Years of education: Not on file   Highest education level: 12th grade  Occupational History   Not on file  Tobacco Use  Smoking status: Every Day    Average packs/day: 0.3 packs/day for 32.2 years (8.0 ttl pk-yrs)    Types: Cigarettes    Start date: 07/18/1990   Smokeless tobacco: Never   Tobacco comments:    4 cigarettes/pd recently quit 03/2021    Restarted smoking.  Quit smoking as of May 2024.  Updated 11/28/2022.  am  Vaping Use   Vaping status: Never Used  Substance and Sexual Activity   Alcohol use: Not Currently   Drug use: Yes    Comment: smokes every day. Last smoked 6/25   Sexual activity: Yes  Other Topics Concern   Not on file  Social History Narrative   Right handed   Social Drivers of Health   Financial Resource Strain: High Risk (04/30/2023)   Overall Financial Resource Strain (CARDIA)    Difficulty of Paying Living Expenses: Hard  Food Insecurity: No Food Insecurity (08/15/2023)   Hunger Vital Sign    Worried About Running Out of Food in the Last Year: Never true    Ran Out of Food in the Last Year: Never true  Transportation Needs: No Transportation Needs (04/30/2023)   PRAPARE - Administrator, Civil Service (Medical): No    Lack of Transportation (Non-Medical): No  Physical Activity: Unknown (04/30/2023)   Exercise Vital Sign    Days of Exercise per Week: Patient declined    Minutes of Exercise per Session: 10 min  Stress: Stress Concern Present  (04/30/2023)   [employer] of Occupational Health - Occupational Stress Questionnaire    Feeling of Stress : Very much  Social Connections: Socially Isolated (04/30/2023)   Social Connection and Isolation Panel    Frequency of Communication with Friends and Family: Three times a week    Frequency of Social Gatherings with Friends and Family: Twice a week    Attends Religious Services: Never    Database administrator or Organizations: No    Attends Engineer, structural: Not on file    Marital Status: Never married   Past Surgical History:  Procedure Laterality Date   AV FISTULA PLACEMENT Left 11/08/2023   Procedure: BRACHIOCEPHALIC ARTERIOVENOUS (AV) FISTULA CREATION;  Surgeon: Pearline Norman RAMAN, MD;  Location: [facility] OR;  Service: Vascular;  Laterality: Left;   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 Right 04/14/2021   Procedure: Achilles tendon debridement and reconstruction, excision of Haglund;  Surgeon: Kit Rush, MD;  Location: [facility];  Service: Orthopedics;  Laterality: Right;   GASTROCNEMIUS RECESSION Right 04/14/2021   Procedure: Right gastroc recession;  Surgeon: Kit Rush, MD;  Location: [facility];  Service: Orthopedics;  Laterality: Right;   Past Medical History:  Diagnosis Date   Chronic kidney disease    ESRD   Elevated creatine kinase 09/2019   Hypertension    Iliac crest spur of left hip    Insomnia 10/2019   Pneumothorax on right    Polycystic kidney disease    Sleep apnea    Vitamin D  deficiency 09/2019   There were no vitals taken for this visit.  Opioid Risk Score:   Fall Risk Score:  `1  Depression screen [facility] 2/9     08/15/2023   10:19 AM 08/13/2023   10:04 AM 03/01/2023   10:17 AM 02/01/2023   11:13 AM 10/27/2022    9:55 AM 07/21/2022   10:16 AM 12/28/2021    1:52 PM  Depression screen PHQ 2/9  Decreased Interest 1 1 0 3  0 0 0  Down, Depressed, Hopeless 1 1 0 2 1 0 0  PHQ - 2 Score 2 2 0  5 1 0 0  Altered sleeping 1   3  3    Tired, decreased energy 1   3     Change in appetite 0   3  0   Feeling bad or failure about yourself  0   2  0   Trouble concentrating 1   1  0   Moving slowly or fidgety/restless 0   2  2   Suicidal thoughts 0   1  0   PHQ-9 Score 5   20  5    Difficult doing work/chores Somewhat difficult   Extremely dIfficult  Not difficult at all       Review of Systems  Musculoskeletal:  Positive for back pain.       Pain in both hips  Psychiatric/Behavioral:         Depression   All other systems reviewed and are negative.      Objective:   Physical Exam   Gen: no distress, normal appearing HEENT: oral mucosa pink and moist, NCAT Chest: normal effort, normal rate of breathing Abd: soft, non-distended Ext: no edema Psych: pleasant, normal affect Skin: intact Neuro:  RUE: 5/5 Deltoid, 5/5 Biceps, 5/5 Triceps, 5/5 Wrist Ext, 5/5 Grip LUE: 5/5 Deltoid, 5/5 Biceps, 5/5 Triceps, 5/5 Wrist Ext, 5/5 Grip RLE: HF 4+/5, KE 4+/5,  ADF 5/5, APF 5/5 LLE: HF 4+/5, KE 4+/5,  ADF 5/5, APF 5/5 -Hip and knee strength appears to be effort/pain limited  Sensory exam normal for light touch and pain in all 4 limbs. No limb ataxia or cerebellar signs. No abnormal tone appreciated.   Musculoskeletal:  Left arm in sling-fistula procedure Minimal TTP b/l Greater trochanter Lower L-spine TTP bilaterally, mild TTP bilateral SI joints Slump test negative Hip pain with internal and external rotation left greater than right-continued Pain lower back with L-spine extension, facet loading equivocal   Excerpt from [facility] note on 05/18/2022 I did review his x-rays from May 2022 and August of 2023 (left hip). Mild degenerative changes of the left hip. No other acute osseous abnormalities.     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  Denies SI or HI 5) Left thigh tingling 6) Obesity There is no height or weight on file to calculate BMI.  Plan  1) Continue gabapentin  current dose as it is reported to be providing some benefit 2) continue tramadol  50 - 100mg  twice daily, Percocet was discontinued previously 3) UDS last visit with carboxy THC low level-this could be consistent with his report of using a locally purchased CBD compound previously.  Patient reports he has not been using this or marijuana anytime recently. 4) Nerve ablation [facility] [place] -patient reports 50% improvement with procedure.  It appears this was left lateral femoral and obturator articular nerve block.  Patient disappointed that this did not last longer however discussed that this was more diagnostic block.  Patient reports he has not been able to do this yet because of his busy schedule and other medical appointments-I advised he contact [facility] to reschedule the procedure 11/13/2023 5) Order Tens unit for pain Zynex not covered, ordered from [facility]- Mild benefit  6) Provided list of foods that can be helpful for pain prior visit 7) Increase Zoloft  to 150 mg daily 8) Recommend weight loss 9) Continue PDMP monitoring, random UDS, Pill counts 10) x-ray L-spine ordered 11) consider Butrans    Warnings Patient forgot pill counts today 7/1, he says his wife was managing his medications and he did not realize he still had pills of this, warning today, he does use medication sparingly and PDMP reviewed with only 2 refills of this medication thus far this year Patient had 10 tabs of Percocet 5 mg ordered for him surgical procedure.  Warning today that in this type of case to contact our clinic before hand for approval

## 2023-11-13 NOTE — Progress Notes (Deleted)
 Subjective:    Patient ID: Kevin Todd, male    DOB: [birth year], 48 y.o.   MRN: [id number]  HPI   Pain Inventory Average Pain 6 Pain Right Now 7 My pain is constant, sharp, stabbing, and throbbing and numbness.  In the last 24 hours, has pain interfered with the following? General activity 5 Relation with others 6 Enjoyment of life 7 What TIME of day is your pain at its worst? daytime, night, and varies Sleep (in general) Poor  Pain is worse with: walking, bending, and standing Pain improves with: medication Relief from Meds: 8  Family History  Problem Relation Age of Onset   Hypertension Mother    Colon cancer Maternal Grandfather    Stomach cancer Neg Hx    Esophageal cancer Neg Hx    Pancreatic cancer Neg Hx    Social History   Socioeconomic History   Marital status: Single    Spouse name: Not on file   Number of children: Not on file   Years of education: Not on file   Highest education level: 12th grade  Occupational History   Not on file  Tobacco Use   Smoking status: Every Day    Average packs/day: 0.3 packs/day for 32.2 years (8.0 ttl pk-yrs)    Types: Cigarettes    Start date: 07/18/1990   Smokeless tobacco: Never   Tobacco comments:    4 cigarettes/pd recently quit 03/2021    Restarted smoking.  Quit smoking as of May 2024.  Updated 11/28/2022.  am  Vaping Use   Vaping status: Never Used  Substance and Sexual Activity   Alcohol use: Not Currently   Drug use: Yes    Comment: smokes every day. Last smoked 6/25   Sexual activity: Yes  Other Topics Concern   Not on file  Social History Narrative   Right handed   Social Drivers of Health   Financial Resource Strain: High Risk (04/30/2023)   Overall Financial Resource Strain (CARDIA)    Difficulty of Paying Living Expenses: Hard  Food Insecurity: No Food Insecurity (08/15/2023)   Hunger Vital Sign    Worried About Running Out of Food in the Last Year: Never true    Ran Out of Food in the Last  Year: Never true  Transportation Needs: No Transportation Needs (04/30/2023)   PRAPARE - Administrator, Civil Service (Medical): No    Lack of Transportation (Non-Medical): No  Physical Activity: Unknown (04/30/2023)   Exercise Vital Sign    Days of Exercise per Week: Patient declined    Minutes of Exercise per Session: 10 min  Stress: Stress Concern Present (04/30/2023)   [employer] of Occupational Health - Occupational Stress Questionnaire    Feeling of Stress : Very much  Social Connections: Socially Isolated (04/30/2023)   Social Connection and Isolation Panel    Frequency of Communication with Friends and Family: Three times a week    Frequency of Social Gatherings with Friends and Family: Twice a week    Attends Religious Services: Never    Database administrator or Organizations: No    Attends Engineer, structural: Not on file    Marital Status: Never married   Past Surgical History:  Procedure Laterality Date   AV FISTULA PLACEMENT Left 11/08/2023   Procedure: BRACHIOCEPHALIC ARTERIOVENOUS (AV) FISTULA CREATION;  Surgeon: Pearline Norman RAMAN, MD;  Location: [facility] OR;  Service: Vascular;  Laterality: Left;   EXCISION HAGLUND'S DEFORMITY WITH ACHILLES TENDON  REPAIR Right 04/14/2021   Procedure: Achilles tendon debridement and reconstruction, excision of Haglund;  Surgeon: Kit Rush, MD;  Location: [facility];  Service: Orthopedics;  Laterality: Right;   GASTROCNEMIUS RECESSION Right 04/14/2021   Procedure: Right gastroc recession;  Surgeon: Kit Rush, MD;  Location: [facility];  Service: Orthopedics;  Laterality: Right;   Past Surgical History:  Procedure Laterality Date   AV FISTULA PLACEMENT Left 11/08/2023   Procedure: BRACHIOCEPHALIC ARTERIOVENOUS (AV) FISTULA CREATION;  Surgeon: Pearline Norman RAMAN, MD;  Location: [facility] OR;  Service: Vascular;  Laterality: Left;   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  Right 04/14/2021   Procedure: Achilles tendon debridement and reconstruction, excision of Haglund;  Surgeon: Kit Rush, MD;  Location: [facility];  Service: Orthopedics;  Laterality: Right;   GASTROCNEMIUS RECESSION Right 04/14/2021   Procedure: Right gastroc recession;  Surgeon: Kit Rush, MD;  Location: [facility];  Service: Orthopedics;  Laterality: Right;   Past Medical History:  Diagnosis Date   Chronic kidney disease    ESRD   Elevated creatine kinase 09/2019   Hypertension    Iliac crest spur of left hip    Insomnia 10/2019   Pneumothorax on right    Polycystic kidney disease    Sleep apnea    Vitamin D  deficiency 09/2019   BP 130/85 (BP Location: Right Arm, Patient Position: Sitting)   Pulse 83   Ht 5' 9 (1.753 m)   Wt 251 lb (113.9 kg)   SpO2 98%   BMI 37.07 kg/m   Opioid Risk Score:   Fall Risk Score:  `1  Depression screen [facility] 2/9     11/13/2023    9:27 AM 08/15/2023   10:19 AM 08/13/2023   10:04 AM 03/01/2023   10:17 AM 02/01/2023   11:13 AM 10/27/2022    9:55 AM 07/21/2022   10:16 AM  Depression screen PHQ 2/9  Decreased Interest 0 1 1 0 3 0 0  Down, Depressed, Hopeless 0 1 1 0 2 1 0  PHQ - 2 Score 0 2 2 0 5 1 0  Altered sleeping  1   3  3   Tired, decreased energy  1   3    Change in appetite  0   3  0  Feeling bad or failure about yourself   0   2  0  Trouble concentrating  1   1  0  Moving slowly or fidgety/restless  0   2  2  Suicidal thoughts  0   1  0  PHQ-9 Score  5   20  5   Difficult doing work/chores  Somewhat difficult   Extremely dIfficult  Not difficult at all    Review of Systems  All other systems reviewed and are negative.      Objective:   Physical Exam        Assessment & Plan:

## 2023-11-19 ENCOUNTER — Ambulatory Visit: Payer: Self-pay | Admitting: Nurse Practitioner

## 2023-11-29 ENCOUNTER — Other Ambulatory Visit: Payer: Self-pay | Admitting: *Deleted

## 2023-11-29 DIAGNOSIS — N186 End stage renal disease: Secondary | ICD-10-CM

## 2023-11-30 ENCOUNTER — Ambulatory Visit: Admitting: Primary Care

## 2023-11-30 ENCOUNTER — Encounter: Payer: Self-pay | Admitting: Primary Care

## 2023-12-05 ENCOUNTER — Ambulatory Visit
Admission: RE | Admit: 2023-12-05 | Discharge: 2023-12-05 | Disposition: A | Discharge status: Home / Self Care | Source: Ambulatory Visit | Attending: Physical Medicine & Rehabilitation | Admitting: Physical Medicine & Rehabilitation

## 2023-12-05 DIAGNOSIS — G8929 Other chronic pain: Secondary | ICD-10-CM

## 2023-12-06 ENCOUNTER — Ambulatory Visit: Admitting: Physical Medicine & Rehabilitation

## 2023-12-13 NOTE — Progress Notes (Unsigned)
    Postoperative Access Visit   History of Present Illness   Kevin Todd is a 48 y.o. year old male who presents for postoperative follow-up for: left brachiocephalic arteriovenous fistula (Date: 11/08/23).  The patient's wounds are healed.  The patient denies steal symptoms.  The patient is able to complete their activities of daily living.  He is not yet on dialysis   Physical Examination   Vitals:   12/14/23 0926  BP: (!) 160/91  Pulse: 71  Temp: 98.2 F (36.8 C)  TempSrc: Temporal  Weight: 252 lb 12.8 oz (114.7 kg)   Body mass index is 37.33 kg/m.  left arm Incision is healed, palpable radial pulse, hand grip is 5/5, sensation in digits is intact, palpable thrill, bruit can be auscultated     Medical Decision Making   Kevin Todd is a 48 y.o. year old male who presents s/p left brachiocephalic arteriovenous fistula  Patent brachiocephalic fistula without signs or symptoms of steal syndrome Despite anastomosis elevated velocity he has a palpable thrill at the anastomosis and distal upper arm.  The fistula is still not yet fully matured and is also running deep greater than 6 mm from the surface.  We will repeat a fistula duplex in 1 month.  If at that time fistula has matured to adequate diameter we will likely proceed with superficialization. He will require [facility] placement if dialysis is initiated in the meantime   Donnice Sender PA-C [facility] Office: [phone]  Clinic MD: Pearline

## 2023-12-14 ENCOUNTER — Ambulatory Visit (HOSPITAL_COMMUNITY)
Admission: RE | Admit: 2023-12-14 | Discharge: 2023-12-14 | Disposition: A | Discharge status: Home / Self Care | Source: Ambulatory Visit | Attending: Vascular Surgery | Admitting: Vascular Surgery

## 2023-12-14 ENCOUNTER — Ambulatory Visit: Attending: Vascular Surgery | Admitting: Physician Assistant

## 2023-12-14 VITALS — BP 160/91 | HR 71 | Temp 98.2°F | Wt 252.8 lb

## 2023-12-14 DIAGNOSIS — N186 End stage renal disease: Secondary | ICD-10-CM | POA: Diagnosis not present

## 2023-12-14 DIAGNOSIS — N184 Chronic kidney disease, stage 4 (severe): Secondary | ICD-10-CM

## 2023-12-17 ENCOUNTER — Other Ambulatory Visit: Payer: Self-pay | Admitting: *Deleted

## 2023-12-17 DIAGNOSIS — N186 End stage renal disease: Secondary | ICD-10-CM

## 2023-12-20 ENCOUNTER — Other Ambulatory Visit: Payer: Self-pay | Admitting: Physical Medicine & Rehabilitation

## 2023-12-21 ENCOUNTER — Ambulatory Visit: Admitting: Nurse Practitioner

## 2024-01-02 ENCOUNTER — Ambulatory Visit: Payer: Self-pay | Admitting: Nurse Practitioner

## 2024-01-02 ENCOUNTER — Encounter: Payer: Self-pay | Admitting: Nurse Practitioner

## 2024-01-02 ENCOUNTER — Ambulatory Visit (INDEPENDENT_AMBULATORY_CARE_PROVIDER_SITE_OTHER): Admitting: Nurse Practitioner

## 2024-01-02 VITALS — BP 140/70 | HR 73 | Temp 98.2°F | Wt 250.2 lb

## 2024-01-02 DIAGNOSIS — N184 Chronic kidney disease, stage 4 (severe): Secondary | ICD-10-CM | POA: Diagnosis not present

## 2024-01-02 DIAGNOSIS — E1122 Type 2 diabetes mellitus with diabetic chronic kidney disease: Secondary | ICD-10-CM

## 2024-01-02 LAB — POCT GLYCOSYLATED HEMOGLOBIN (HGB A1C): Hemoglobin A1C: 5.4 % (ref 4.0–5.6)

## 2024-01-02 MED ORDER — OZEMPIC (1 MG/DOSE) 4 MG/3ML ~~LOC~~ SOPN: 1.0000 mg | PEN_INJECTOR | SUBCUTANEOUS | 2 refills | Status: AC

## 2024-01-02 MED ORDER — VITAMIN D (ERGOCALCIFEROL) 1.25 MG (50000 UNIT) PO CAPS: 50000.0000 [IU] | ORAL_CAPSULE | ORAL | 2 refills | Status: AC

## 2024-01-02 NOTE — Progress Notes (Signed)
 Subjective   Patient ID: Kevin Todd, male    DOB: January 28, 1976, 48 y.o.   MRN: [id number]  Chief Complaint  Patient presents with   Medical Management of Chronic Issues    Follow up    Referring provider: Oley Bascom RAMAN, NP  Kevin Todd is a 48 y.o. male with Past Medical History: No date: Chronic kidney disease     Comment:  ESRD 09/2019: Elevated creatine kinase No date: Hypertension No date: Iliac crest spur of left hip 10/2019: Insomnia No date: Pneumothorax on right No date: Polycystic kidney disease No date: Sleep apnea 09/2019: Vitamin D  deficiency   HPI  Patient presents today to follow-up on diabetes. A1C in office today is 5.4.  Patient is compliant with medications.  [facility]. Patient does have chronic hip pain.  He is currently being followed by Ortho.  He was told that he does need a total hip replacement to both hips but does need to lose weight first. We also referred him to pain clinic. He has followed with them. Denies f/c/s, n/v/d, hemoptysis, PND, leg swelling. Denies chest pain or edema.     Allergies  Allergen Reactions   Nsaids     Kidney Disease  Other Reaction(s): Other (See Comments)  Renal Disease    Immunization History  Administered Date(s) Administered   Moderna Sars-Covid-2 Vaccination 01/15/2020   PFIZER(Purple Top)SARS-COV-2 Vaccination 12/18/2019    Tobacco History: Social History   Tobacco Use  Smoking Status Every Day   Average packs/day: 0.3 packs/day for 32.2 years (8.0 ttl pk-yrs)   Types: Cigarettes   Start date: 07/18/1990  Smokeless Tobacco Never  Tobacco Comments   4 cigarettes/pd recently quit 03/2021   Restarted smoking.  Quit smoking as of May 2024.  Updated 11/28/2022.  am   Ready to quit: Not Answered Counseling given: Yes Tobacco comments: 4 cigarettes/pd recently quit 03/2021 Restarted smoking.  Quit smoking as of May 2024.  Updated 11/28/2022.   am   Outpatient Encounter Medications as of 01/02/2024  Medication Sig   amLODipine  (NORVASC ) 10 MG tablet Take 1 tablet (10 mg total) by mouth daily.   atorvastatin  (LIPITOR) 80 MG tablet Take 1 tablet (80 mg total) by mouth daily.   Continuous Blood Gluc Sensor (FREESTYLE LIBRE 3 SENSOR) MISC Place 1 sensor on the skin every 14 days. Use to check glucose continuously   furosemide (LASIX) 40 MG tablet Take 40 mg by mouth 2 (two) times daily.   gabapentin  (NEURONTIN ) 300 MG capsule Take 300 mg by mouth 3 (three) times daily.   glucose blood (ACCU-CHEK GUIDE) test strip Use as directed to test blood sugars BID   hydrALAZINE  (APRESOLINE ) 100 MG tablet Take 100 mg by mouth 2 (two) times daily.   JYNARQUE  90 & 30 MG TBPK Take 1 tablet by mouth in the morning and at bedtime.   metoprolol  succinate (TOPROL -XL) 50 MG 24 hr tablet Take 50 mg by mouth daily.   ondansetron  (ZOFRAN ) 8 MG tablet Take 8 mg by mouth 2 (two) times daily as needed.   sertraline  (ZOLOFT ) 100 MG tablet Take 1 tablet (100 mg total) by mouth daily.   sertraline  (ZOLOFT ) 50 MG tablet Take 1 tablet (50 mg total) by mouth daily. Take with 100mg  capsule for 150mg  daily today   sildenafil  (VIAGRA ) 100 MG tablet Take 0.5-1 tablets (50-100 mg total) by mouth daily as needed for erectile dysfunction.   sodium bicarbonate  650 MG tablet Take  650 mg by mouth 2 (two) times daily.   tiZANidine  (ZANAFLEX ) 4 MG tablet Take 1 tablet (4 mg total) by mouth at bedtime.   traMADol  (ULTRAM ) 50 MG tablet Take 1-2 tablets (50-100 mg total) by mouth 2 (two) times daily as needed.   [DISCONTINUED] Semaglutide , 1 MG/DOSE, (OZEMPIC , 1 MG/DOSE,) 4 MG/3ML SOPN INJECT 1 MG ONCE A WEEK AS DIRECTED   [DISCONTINUED] Vitamin D , Ergocalciferol , (DRISDOL ) 1.25 MG (50000 UNIT) CAPS capsule Take 1 capsule (50,000 Units total) by mouth every 7 (seven) days.   Semaglutide , 1 MG/DOSE, (OZEMPIC , 1 MG/DOSE,) 4 MG/3ML SOPN Inject 1 mg into the skin once a week.   Vitamin  D, Ergocalciferol , (DRISDOL ) 1.25 MG (50000 UNIT) CAPS capsule Take 1 capsule (50,000 Units total) by mouth every 7 (seven) days.   No facility-administered encounter medications on file as of 01/02/2024.    Review of Systems  Review of Systems  Constitutional: Negative.   HENT: Negative.    Cardiovascular: Negative.   Gastrointestinal: Negative.   Allergic/Immunologic: Negative.   Neurological: Negative.   Psychiatric/Behavioral: Negative.       Objective:   BP (!) 140/70   Pulse 73   Temp 98.2 F (36.8 C) (Oral)   Wt 250 lb 3.2 oz (113.5 kg)   SpO2 98%   BMI 36.95 kg/m   Wt Readings from Last 5 Encounters:  01/02/24 250 lb 3.2 oz (113.5 kg)  12/14/23 252 lb 12.8 oz (114.7 kg)  11/13/23 251 lb (113.9 kg)  11/08/23 257 lb (116.6 kg)  10/26/23 259 lb 12.8 oz (117.8 kg)     Physical Exam Vitals and nursing note reviewed.  Constitutional:      General: He is not in acute distress.    Appearance: He is well-developed.  Cardiovascular:     Rate and Rhythm: Normal rate and regular rhythm.  Pulmonary:     Effort: Pulmonary effort is normal.     Breath sounds: Normal breath sounds.  Skin:    General: Skin is warm and dry.  Neurological:     Mental Status: He is alert and oriented to person, place, and time.       Assessment & Plan:   Type 2 diabetes mellitus with stage 4 chronic kidney disease, without long-term current use of insulin  (HCC) -     POCT glycosylated hemoglobin (Hb A1C)  Other orders -     Vitamin D  (Ergocalciferol ); Take 1 capsule (50,000 Units total) by mouth every 7 (seven) days.  Dispense: 5 capsule; Refill: 2 -     Ozempic  (1 MG/DOSE); Inject 1 mg into the skin once a week.  Dispense: 3 mL; Refill: 2     Return in about 3 months (around 04/03/2024).     Bascom GORMAN Borer, NP 01/02/2024

## 2024-01-15 ENCOUNTER — Encounter: Admitting: Physical Medicine & Rehabilitation

## 2024-01-29 ENCOUNTER — Other Ambulatory Visit: Payer: Self-pay | Admitting: Physical Medicine & Rehabilitation

## 2024-02-08 ENCOUNTER — Ambulatory Visit (INDEPENDENT_AMBULATORY_CARE_PROVIDER_SITE_OTHER): Admitting: Physician Assistant

## 2024-02-08 ENCOUNTER — Telehealth: Payer: Self-pay

## 2024-02-08 ENCOUNTER — Ambulatory Visit (HOSPITAL_COMMUNITY)
Admission: RE | Admit: 2024-02-08 | Discharge: 2024-02-08 | Disposition: A | Discharge status: Home / Self Care | Source: Ambulatory Visit | Attending: Vascular Surgery | Admitting: Vascular Surgery

## 2024-02-08 VITALS — BP 143/81 | HR 64 | Temp 98.2°F | Resp 18 | Ht 69.0 in | Wt 241.9 lb

## 2024-02-08 DIAGNOSIS — N186 End stage renal disease: Secondary | ICD-10-CM | POA: Insufficient documentation

## 2024-02-08 NOTE — Progress Notes (Signed)
 Office Note     CC:  follow up Requesting Provider:  Oley Bascom RAMAN, NP  HPI: Kevin Todd is a 48 y.o. (1976/02/19) male who presents for evaluation of left brachiocephalic fistula.  This was created on 11/08/2023 by Dr. Pearline.  He denies any steal symptoms in his left hand.  He is not yet on hemodialysis.  He is going on a cruise to the [place] starting tomorrow for his honeymoon.   Past Medical History:  Diagnosis Date   Chronic kidney disease    ESRD   Elevated creatine kinase 09/2019   Hypertension    Iliac crest spur of left hip    Insomnia 10/2019   Pneumothorax on right    Polycystic kidney disease    Sleep apnea    Vitamin D  deficiency 09/2019    Past Surgical History:  Procedure Laterality Date   AV FISTULA PLACEMENT Left 11/08/2023   Procedure: BRACHIOCEPHALIC ARTERIOVENOUS (AV) FISTULA CREATION;  Surgeon: Pearline Norman RAMAN, MD;  Location[facility];  Service: Vascular;  Laterality: Left;   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 Right 04/14/2021   Procedure: Achilles tendon debridement and reconstruction, excision of Haglund;  Surgeon: Kit Rush, MD;  Location: [facility];  Service: Orthopedics;  Laterality: Right;   GASTROCNEMIUS RECESSION Right 04/14/2021   Procedure: Right gastroc recession;  Surgeon: Kit Rush, MD;  Location: [facility];  Service: Orthopedics;  Laterality: Right;    Social History   Socioeconomic History   Marital status: Single    Spouse name: Not on file   Number of children: Not on file   Years of education: Not on file   Highest education level: 12th grade  Occupational History   Not on file  Tobacco Use   Smoking status: Every Day    Average packs/day: 0.3 packs/day for 32.2 years (8.0 ttl pk-yrs)    Types: Cigarettes    Start date: 07/18/1990   Smokeless tobacco: Never   Tobacco comments:    4 cigarettes/pd recently quit 03/2021    Restarted smoking.  Quit smoking as of May 2024.   Updated 11/28/2022.  am  Vaping Use   Vaping status: Never Used  Substance and Sexual Activity   Alcohol use: Not Currently   Drug use: Yes    Comment: smokes every day. Last smoked 6/25   Sexual activity: Yes  Other Topics Concern   Not on file  Social History Narrative   Right handed   Social Drivers of Health   Financial Resource Strain: High Risk (04/30/2023)   Overall Financial Resource Strain (CARDIA)    Difficulty of Paying Living Expenses: Hard  Food Insecurity: No Food Insecurity (08/15/2023)   Hunger Vital Sign    Worried About Running Out of Food in the Last Year: Never true    Ran Out of Food in the Last Year: Never true  Transportation Needs: No Transportation Needs (04/30/2023)   PRAPARE - Administrator, Civil Service (Medical): No    Lack of Transportation (Non-Medical): No  Physical Activity: Unknown (04/30/2023)   Exercise Vital Sign    Days of Exercise per Week: Patient declined    Minutes of Exercise per Session: 10 min  Stress: Stress Concern Present (04/30/2023)   [employer] of Occupational Health - Occupational Stress Questionnaire    Feeling of Stress : Very much  Social Connections: Socially Isolated (04/30/2023)   Social Connection and Isolation Panel    Frequency of Communication  with Friends and Family: Three times a week    Frequency of Social Gatherings with Friends and Family: Twice a week    Attends Religious Services: Never    Database administrator or Organizations: No    Attends Engineer, structural: Not on file    Marital Status: Never married  Intimate Partner Violence: Unknown (03/12/2023)   Received from [facility]   HITS    Physically Hurt: Not on file    Insult or Talk Down To: Not on file    Threaten Physical Harm: Not on file    Scream or Curse: Not on file    Family History  Problem Relation Age of Onset   Hypertension Mother    Colon cancer Maternal Grandfather    Stomach cancer Neg Hx     Esophageal cancer Neg Hx    Pancreatic cancer Neg Hx     Current Outpatient Medications  Medication Sig Dispense Refill   amLODipine  (NORVASC ) 10 MG tablet Take 1 tablet (10 mg total) by mouth daily. 30 tablet 1   atorvastatin  (LIPITOR) 80 MG tablet Take 1 tablet (80 mg total) by mouth daily. 90 tablet 3   Continuous Blood Gluc Sensor (FREESTYLE LIBRE 3 SENSOR) MISC Place 1 sensor on the skin every 14 days. Use to check glucose continuously 2 each 11   furosemide (LASIX) 40 MG tablet Take 40 mg by mouth 2 (two) times daily.     gabapentin  (NEURONTIN ) 300 MG capsule Take 300 mg by mouth 3 (three) times daily.     glucose blood (ACCU-CHEK GUIDE) test strip Use as directed to test blood sugars BID 200 strip 5   hydrALAZINE  (APRESOLINE ) 100 MG tablet Take 100 mg by mouth 2 (two) times daily.     JYNARQUE  90 & 30 MG TBPK Take 1 tablet by mouth in the morning and at bedtime.     metoprolol  succinate (TOPROL -XL) 50 MG 24 hr tablet Take 50 mg by mouth daily.     ondansetron  (ZOFRAN ) 8 MG tablet Take 8 mg by mouth 2 (two) times daily as needed.     Semaglutide , 1 MG/DOSE, (OZEMPIC , 1 MG/DOSE,) 4 MG/3ML SOPN Inject 1 mg into the skin once a week. 3 mL 2   sertraline  (ZOLOFT ) 100 MG tablet Take 1 tablet (100 mg total) by mouth daily. 30 tablet 4   sertraline  (ZOLOFT ) 50 MG tablet Take 1 tablet (50 mg total) by mouth daily. Take with 100mg  capsule for 150mg  daily today 30 tablet 5   sildenafil  (VIAGRA ) 100 MG tablet Take 0.5-1 tablets (50-100 mg total) by mouth daily as needed for erectile dysfunction. 20 tablet 3   sodium bicarbonate  650 MG tablet Take 650 mg by mouth 2 (two) times daily.     tiZANidine  (ZANAFLEX ) 4 MG tablet Take 1 tablet (4 mg total) by mouth at bedtime. 30 tablet 0   traMADol  (ULTRAM ) 50 MG tablet Take 1-2 tablets (50-100 mg total) by mouth 2 (two) times daily as needed. 120 tablet 2   Vitamin D , Ergocalciferol , (DRISDOL ) 1.25 MG (50000 UNIT) CAPS capsule Take 1 capsule (50,000 Units  total) by mouth every 7 (seven) days. 5 capsule 2   No current facility-administered medications for this visit.    Allergies  Allergen Reactions   Nsaids     Kidney Disease  Other Reaction(s): Other (See Comments)  Renal Disease     REVIEW OF SYSTEMS:  Negative unless noted in HPI [X]  denotes positive finding, [ ]  denotes  negative finding Cardiac  Comments:  Chest pain or chest pressure:    Shortness of breath upon exertion:    Short of breath when lying flat:    Irregular heart rhythm:        Vascular    Pain in calf, thigh, or hip brought on by ambulation:    Pain in feet at night that wakes you up from your sleep:     Blood clot in your veins:    Leg swelling:         Pulmonary    Oxygen at home:    Productive cough:     Wheezing:         Neurologic    Sudden weakness in arms or legs:     Sudden numbness in arms or legs:     Sudden onset of difficulty speaking or slurred speech:    Temporary loss of vision in one eye:     Problems with dizziness:         Gastrointestinal    Blood in stool:     Vomited blood:         Genitourinary    Burning when urinating:     Blood in urine:        Psychiatric    Major depression:         Hematologic    Bleeding problems:    Problems with blood clotting too easily:        Skin    Rashes or ulcers:        Constitutional    Fever or chills:      PHYSICAL EXAMINATION:  Vitals:   02/08/24 1014  BP: (!) 143/81  Pulse: 64  Resp: 18  Temp: 98.2 F (36.8 C)  TempSrc: Temporal  SpO2: 99%  Weight: 241 lb 14.4 oz (109.7 kg)  Height: 5' 9 (1.753 m)    General:  WDWN in NAD; vital signs documented above Gait: Not observed HENT: WNL, normocephalic Pulmonary: normal non-labored breathing Cardiac: regular HR Abdomen: soft, NT, no masses Skin: without rashes Vascular Exam/Pulses: Palpable left radial pulse; symmetrical grip strength; palpable thrill throughout the fistula in the upper arm Musculoskeletal:  no muscle wasting or atrophy  Neurologic: A&O X 3 Psychiatric:  The pt has Normal affect.   Non-Invasive Vascular Imaging:    Patent left arm brachiocephalic fistula with large sized branching and diameter 6 mm from the skin   ASSESSMENT/PLAN:: 48 y.o. male here for follow up for evaluation of left brachiocephalic fistula  Left brachiocephalic fistula is patent with a palpable thrill.  Fortunately since last exam the fistula has matured.  It still runs about 6 mm from the surface of the skin.  In order to reliably cannulate his fistula he will require superficialization surgery.  He is not yet on hemodialysis.  He is leaving for his honeymoon tomorrow.  We will schedule him with Dr. Pearline in the next few weeks for left arm AV fistula superficialization.  Case was discussed in detail with the patient and he is agreeable to proceed.   Donnice Sender, PA-C [facility] [phone]  Clinic MD:   Gretta on call

## 2024-02-08 NOTE — Telephone Encounter (Signed)
 Patient will call to schedule L arm AVF superficialization with Dr. Pearline.

## 2024-02-11 ENCOUNTER — Ambulatory Visit: Admitting: Podiatry

## 2024-02-15 ENCOUNTER — Encounter: Attending: Physical Medicine & Rehabilitation | Admitting: Physical Medicine & Rehabilitation

## 2024-02-15 DIAGNOSIS — F32A Depression, unspecified: Secondary | ICD-10-CM | POA: Insufficient documentation

## 2024-02-15 DIAGNOSIS — M25552 Pain in left hip: Secondary | ICD-10-CM | POA: Insufficient documentation

## 2024-02-15 DIAGNOSIS — M5442 Lumbago with sciatica, left side: Secondary | ICD-10-CM | POA: Insufficient documentation

## 2024-02-15 DIAGNOSIS — G8929 Other chronic pain: Secondary | ICD-10-CM | POA: Insufficient documentation

## 2024-02-15 DIAGNOSIS — M25551 Pain in right hip: Secondary | ICD-10-CM | POA: Insufficient documentation

## 2024-02-15 DIAGNOSIS — N184 Chronic kidney disease, stage 4 (severe): Secondary | ICD-10-CM | POA: Insufficient documentation

## 2024-02-26 ENCOUNTER — Other Ambulatory Visit: Payer: Self-pay

## 2024-02-26 DIAGNOSIS — N186 End stage renal disease: Secondary | ICD-10-CM

## 2024-03-11 ENCOUNTER — Encounter: Payer: Self-pay | Admitting: Physical Medicine & Rehabilitation

## 2024-03-11 ENCOUNTER — Encounter: Admitting: Physical Medicine & Rehabilitation

## 2024-03-11 VITALS — BP 158/75 | HR 80 | Ht 69.0 in | Wt 242.2 lb

## 2024-03-11 DIAGNOSIS — M25552 Pain in left hip: Secondary | ICD-10-CM | POA: Diagnosis present

## 2024-03-11 DIAGNOSIS — N184 Chronic kidney disease, stage 4 (severe): Secondary | ICD-10-CM

## 2024-03-11 DIAGNOSIS — M25551 Pain in right hip: Secondary | ICD-10-CM | POA: Diagnosis present

## 2024-03-11 DIAGNOSIS — G8929 Other chronic pain: Secondary | ICD-10-CM | POA: Diagnosis not present

## 2024-03-11 DIAGNOSIS — F32A Depression, unspecified: Secondary | ICD-10-CM | POA: Diagnosis present

## 2024-03-11 DIAGNOSIS — M5442 Lumbago with sciatica, left side: Secondary | ICD-10-CM

## 2024-03-11 MED ORDER — TRAMADOL HCL 50 MG PO TABS: 50.0000 mg | ORAL_TABLET | Freq: Two times a day (BID) | ORAL | 2 refills | Status: DC | PRN

## 2024-03-11 NOTE — Progress Notes (Signed)
 Subjective:    Patient ID: Kevin Todd, male    DOB: 05/12/1976, 48 y.o.   MRN: [id number]  HPI Nerve block a couple of weeks ago. Done in [place]   HPI 02/01/2023  Kevin Todd is a 48 y.o. year old male  who  has a past medical history of Chronic kidney disease, Elevated creatine kinase (09/2019), Hypertension, Iliac crest spur of left hip, Insomnia (10/2019), Pneumothorax on right, Polycystic kidney disease, Sleep apnea, and Vitamin D  deficiency (09/2019).   They are presenting to PM&R clinic as a new patient for pain management evaluation. They were referred by Bascom Borer for treatment of hip pain.  Patient reports he has been having severe hip pain worsening for about 5 to 6 years.  He reports both of his hips hurt however pain is the worst in his left hip.  He also has less severe pain in his lower back.  Pain is worsened with any kind of activity.  He says he cannot sleep due to the pain.  He also cannot sit for too long without the pain worsening.  He often has to change positions.  Walking severely worsens his pain.  He has been following with [facility] and has been given gabapentin .  He is also received IA injections for his bilateral hips without significant benefit reported.  Patient reports he would like to improve his activities so that he could potentially work one day.  He will occasionally have tingling along his left thigh.  Patient reports that orthopedics told him he would have to lose weight before he could be considered for hip replacement.  Patient reports his mood is significantly decreased and would like medication to help this.  He denies SI or HI.   Red flag symptoms: No red flags for back pain endorsed in Hx or ROS  Medications tried: Topical medications denies  Nsaids kidney disease Tylenol   minimal benefit Opiates Tramdol- waiting on insurance to approve  Gabapentin  - Helps a little  TCAs - Denies  SNRIs - Denies    Other treatments: PT/OT   Therapy heped a little TENs unit - Denies Injections  Hip cortisone did not help, appears these were intra-articular injections bilaterally Surgery  Denies   Goals for pain control: Improve his pain so he can be more active     03/01/23 Interval history Patient is here for follow-up regarding his chronic hip pain.  Pain largely unchanged since last visit.  Patient did try tramadol  and oxycodone  previously ordered by PCP.  He reports the tramadol  decreased this pain however the Percocet provided better relief.  Patient reports he only used a few tabs of the Percocet medication since he picked it up a few weeks ago.  He would like to avoid stronger narcotic medications if possible.  Discussed UDS with 83 ng/mL carboxy THC noted.  Patient reported previously that he had been using a edible CBD product.  He reports he is no longer using this product as he was told not to do this last visit.  Patient says he will no longer use this product.  We discussed that on UDS I cannot distinguish these products from marijuana.  04/10/23 Interval History  Patient is here for follow-up regarding his chronic pain.  Patient reports that tramadol  continues to help his pain however he has some days where he feels like it is not strong enough to keep his pain under control.  His prior Percocet 5 mg tabs were better when  pain is particularly severe.  He is not having any side effects with this medication.  TENS unit provides mild relief.  Patient reports he could not have hip nerve ablation due to insurance issue however he says [facility] is still working on this possibility.  06/14/23 Interval History  Patient is here for follow-up regarding treatment of his bilateral hip pain left greater than right.  Left hip continues to be the greatest location of his pain.  He also has lower back pain.  Patient reports he had procedure with [facility] that helped reduce his pain about 50% for about a week.   Patient reports that tramadol  helps reduce his pain but sometimes is not strong enough.  He has not tried using 100 mg dose of tramadol  and uses his medications sparingly.  Reports waiting to hear from [facility]    08/13/23 Interval History  Continued b/l hip pain L>R. He says he has not heard from [facility]. Tramadol  helping reduce his pain.  Reports mood has been decreased and has increased anxiety.   11/13/23 Interval History  Has been having more pain in his left hip and and bilateral lower back.  Pain in his back is largely like a band across his lower back.  Back pain has been worse for about a month.  Patient reports sometimes pain shoots down his left leg.  He had a fistula placed in L arm for potential dialysis.  He reports he going to see [facility] again for his hips and back. He wasn't able to get the nerve block completed due to busy schedule.  Patient has been using tramadol  intermittently.  Reports this does help his pain and usually takes 2 tabs at a time but tries not to use it every day.  Patient requests slightly higher  dose of Zoloft  as mood continues to be decreased at times, sometimes feels irritable.  Patient was given 10 tabs of oxycodone  for the fistula procedure.  Reports this was beneficial but makes him feel little bit off to where he usually stays in the house after he takes it.  03/11/24 Interval History  Reports that the RFA hip pain, performed after the last visit in July at [facility], provided only 1-2 weeks of relief. Pain has returned. Current pain level is 6/10, which they attribute possibly to the weather. A fall was mentioned due to his dog bumping him but appears to be unrelated to the current pain presentation and no major injury. Reports new diagnosis of arthritis in the back [facility] confirmed with x-rays showing diminished cartilage. The primary location of pain remains the hips, not the back.  Mood is poor. This is attributed by  their nephrologist to underlying kidney disease. Has an upcoming surgery on the arm for an artificial graft in preparation for potential dialysis.  Medication Review - Tramadol : Ran out of medication, reportedly due to a lock at the pharmacy preventing a refill. Prior to running out, was taking 1-2 tablets as needed, which provided some pain relief and improved function, allowing for walking and some exercise. No side effects reported. The medication helped manage the pain to a level where they could remain active. Expresses desire to continue tramadol .  Prior UDS results:     Component Value Date/Time   LABOPIA NONE DETECTED 02/28/2016 0315   COCAINSCRNUR NONE DETECTED 02/28/2016 0315   LABBENZ NONE DETECTED 02/28/2016 0315   AMPHETMU NONE DETECTED 02/28/2016 0315   THCU POSITIVE (A) 02/28/2016  0315   LABBARB NONE DETECTED 02/28/2016 0315      Pain Inventory Average Pain 5 Pain Right Now 6 My pain is constant, sharp, stabbing, and throbbing and numbness.  In the last 24 hours, has pain interfered with the following? General activity 6 Relation with others 5 Enjoyment of life 6 What TIME of day is your pain at its worst? morning  and evening Sleep (in general) Poor  Pain is worse with: walking, sitting, and some activites Pain improves with: medication Relief from Meds: 8      Family History  Problem Relation Age of Onset   Hypertension Mother    Colon cancer Maternal Grandfather    Stomach cancer Neg Hx    Esophageal cancer Neg Hx    Pancreatic cancer Neg Hx    Social History   Socioeconomic History   Marital status: Single    Spouse name: Not on file   Number of children: Not on file   Years of education: Not on file   Highest education level: 12th grade  Occupational History   Not on file  Tobacco Use   Smoking status: Every Day    Average packs/day: 0.3 packs/day for 32.2 years (8.0 ttl pk-yrs)    Types: Cigarettes    Start date: 07/18/1990   Smokeless  tobacco: Never   Tobacco comments:    4 cigarettes/pd recently quit 03/2021    Restarted smoking.  Quit smoking as of May 2024.  Updated 11/28/2022.  am  Vaping Use   Vaping status: Never Used  Substance and Sexual Activity   Alcohol use: Not Currently   Drug use: Yes    Comment: smokes every day. Last smoked 6/25   Sexual activity: Yes  Other Topics Concern   Not on file  Social History Narrative   Right handed   Social Drivers of Health   Financial Resource Strain: High Risk (04/30/2023)   Overall Financial Resource Strain (CARDIA)    Difficulty of Paying Living Expenses: Hard  Food Insecurity: No Food Insecurity (08/15/2023)   Hunger Vital Sign    Worried About Running Out of Food in the Last Year: Never true    Ran Out of Food in the Last Year: Never true  Transportation Needs: No Transportation Needs (04/30/2023)   PRAPARE - Administrator, Civil Service (Medical): No    Lack of Transportation (Non-Medical): No  Physical Activity: Unknown (04/30/2023)   Exercise Vital Sign    Days of Exercise per Week: Patient declined    Minutes of Exercise per Session: 10 min  Stress: Stress Concern Present (04/30/2023)   [employer] of Occupational Health - Occupational Stress Questionnaire    Feeling of Stress : Very much  Social Connections: Socially Isolated (04/30/2023)   Social Connection and Isolation Panel    Frequency of Communication with Friends and Family: Three times a week    Frequency of Social Gatherings with Friends and Family: Twice a week    Attends Religious Services: Never    Database Administrator or Organizations: No    Attends Engineer, Structural: Not on file    Marital Status: Never married   Past Surgical History:  Procedure Laterality Date   AV FISTULA PLACEMENT Left 11/08/2023   Procedure: BRACHIOCEPHALIC ARTERIOVENOUS (AV) FISTULA CREATION;  Surgeon: Pearline Norman RAMAN, MD;  Location: [facility] OR;  Service: Vascular;  Laterality: Left;    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;  Service: Orthopedics;  Laterality: Right;   GASTROCNEMIUS RECESSION Right 04/14/2021   Procedure: Right gastroc recession;  Surgeon: Kit Rush, MD;  Location: [facility];  Service: Orthopedics;  Laterality: Right;   Past Medical History:  Diagnosis Date   Chronic kidney disease    ESRD   Elevated creatine kinase 09/2019   Hypertension    Iliac crest spur of left hip    Insomnia 10/2019   Pneumothorax on right    Polycystic kidney disease    Sleep apnea    Vitamin D  deficiency 09/2019   Ht 5' 9 (1.753 m)   BMI 35.72 kg/m   Opioid Risk Score:   Fall Risk Score:  `1  Depression screen [facility] 2/9     11/13/2023    9:27 AM 08/15/2023   10:19 AM 08/13/2023   10:04 AM 03/01/2023   10:17 AM 02/01/2023   11:13 AM 10/27/2022    9:55 AM 07/21/2022   10:16 AM  Depression screen PHQ 2/9  Decreased Interest 0 1 1 0 3 0 0  Down, Depressed, Hopeless 0 1 1 0 2 1 0  PHQ - 2 Score 0 2 2 0 5 1 0  Altered sleeping  1   3  3   Tired, decreased energy  1   3    Change in appetite  0   3  0  Feeling bad or failure about yourself   0   2  0  Trouble concentrating  1   1  0  Moving slowly or fidgety/restless  0   2  2  Suicidal thoughts  0   1  0  PHQ-9 Score  5   20  5   Difficult doing work/chores  Somewhat difficult   Extremely dIfficult  Not difficult at all      Review of Systems  Musculoskeletal:  Positive for back pain.       Pain in both hips  Psychiatric/Behavioral:         Depression   All other systems reviewed and are negative.      Objective:   Physical Exam   Gen: no distress, normal appearing HEENT: oral mucosa pink and moist, NCAT Chest: normal effort, normal rate of breathing Abd: soft, non-distended Ext: no edema Psych: pleasant,  normal affect Skin: intact Neuro:  RUE: 5/5 Deltoid, 5/5 Biceps, 5/5 Triceps, 5/5 Wrist Ext, 5/5 Grip LUE: 5/5 Deltoid, 5/5 Biceps, 5/5 Triceps, 5/5 Wrist Ext, 5/5 Grip RLE: HF 4+/5, KE 4+/5,  ADF 5/5, APF 5/5 LLE: HF 4+/5, KE 4+/5,  ADF 5/5, APF 5/5 -Hip and knee strength appears to be effort/pain limited  Sensory exam normal for light touch and pain in all 4 limbs. No limb ataxia or cerebellar signs. No abnormal tone appreciated.   Musculoskeletal:  Minimal TTP b/l Greater trochanters Mild L spine tenderness Hip pain with mild internal and external rotation left greater than right-unchanged from prior  Prior exam Pain lower back with L-spine extension, facet loading equivocal   Excerpt from [facility] note on 05/18/2022 I did review his x-rays from May 2022 and August of 2023 (left hip). Mild degenerative changes of the left hip. No other acute osseous abnormalities.     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  Denies SI or HI 5) Left thigh tingling 6) Obesity Body mass index is 35.72 kg/m.  Plan  1) Continue gabapentin  current dose as it is reported to be providing some benefit 2) continue tramadol  50 - 100mg  twice daily, Percocet was discontinued previously 3) UDS last visit with carboxy THC low level-this could be consistent with his report of using a locally purchased CBD compound previously.  Patient reports he has not been using this or marijuana anytime recently. 4) Nerve ablation [facility] -Pt reports only about 2 weeks of benefit with RFA 5) Order Tens unit for pain Zynex not covered, ordered from [employer]- Mild benefit  6) Provided list of foods that can be helpful for pain prior visit 7) Increased Zoloft  to 150 mg daily last  visit- continue, psychology referral placed today 8) Recommend weight loss 9) Continue PDMP monitoring, random UDS, Pill counts 10) x-ray L-spine ordered 11) consider Butrans    Warnings Patient forgot pill counts today 7/1, he says his wife was managing his medications and he did not realize he still had pills of this, warning today, he does use medication sparingly and PDMP reviewed with only 2 refills of this medication thus far this year Patient had 10 tabs of Percocet 5 mg ordered for him surgical procedure.  Warning today that in this type of case to contact our clinic before hand for approval

## 2024-03-12 ENCOUNTER — Other Ambulatory Visit: Payer: Self-pay

## 2024-03-12 ENCOUNTER — Encounter (HOSPITAL_COMMUNITY): Payer: Self-pay | Admitting: Vascular Surgery

## 2024-03-12 NOTE — Progress Notes (Signed)
 Anesthesia Chart Review: Same day workup  48 yo male with pertinent hx including HTN, class 3 obesity on semaglutide , polycystic kidney disease, CKD 5 not yet on HD (had left brachiocephalic fistula placed 10/2023 in anticipation).    Pt being evaluated at [facility] for renal transplant. Echo 05/2023 showed EF 60-65%, normal RV, normal valves. Nuclear stress test 05/2023 was interpreted as abnormal. In followup with cardiologist Dr. Toribio on 06/15/23 he stated, Abnormal nuclear stress test. In the course of this patient's evaluation for possible kidney transplant, he underwent a nuclear stress test which was interpreted as abnormal. I personally reviewed the images from this test to the ability that I am able to do so (image manipulation is not facilitated with current technology in our institution). The nuclear perfusion results at rest and stress look normal to me. Pt did ultimately have a cath on 12/28/2023 showing, normal coronary angiogram of the left coronary system.   Patient reports last dose semaglutide  03/03/2024.  He will need DOS labs and eval.    EKG 06/15/23 (care everywhere): Sinus rhythm.  Rate 82.  Septal infarct, age undetermined.   Cath 12/28/2023 (Care Everywhere): DIAGNOSTIC FINDINGS:    1.  Normal coronary angiogram of the left coronary system.   COMPLICATIONS:  None   EBL: <25 mL   RECOMMENDATIONS:  The nuclear stress test should be regarded as a false positive.  Coronary  perfusion to the LAD territory is normal.  From a cardiology standpoint, the patient can proceed with kidney  transplantation with a low risk of cardiac complications.   TTE 05/18/23 (care everywhere): SUMMARY  The left ventricular size is normal with normal left ventricular wall thickness.  Left ventricular systolic function is normal.  LV ejection fraction = 60-65%.  The right ventricle is normal in size and function.  There is no significant valvular stenosis or regurgitation.  There is no  comparison study available.      Lynwood Geofm RIGGERS [facility] Short Stay Center/Anesthesiology Phone [phone] 03/12/2024 1:44 PM

## 2024-03-12 NOTE — Anesthesia Preprocedure Evaluation (Signed)
 Anesthesia Evaluation  Patient identified by MRN, date of birth, ID band Patient awake    Reviewed: Allergy & Precautions, NPO status , Patient's Chart, lab work & pertinent test results  History of Anesthesia Complications Negative for: history of anesthetic complications  Airway Mallampati: III  TM Distance: >3 FB Neck ROM: Full    Dental  (+) Teeth Intact, Dental Advisory Given   Pulmonary sleep apnea , Current Smoker   breath sounds clear to auscultation       Cardiovascular hypertension, (-) CAD  Rhythm:Regular Rate:Normal     Neuro/Psych  PSYCHIATRIC DISORDERS  Depression       GI/Hepatic   Endo/Other  diabetes, Well Controlled, Type 2    Renal/GU Renal InsufficiencyRenal disease     Musculoskeletal  (+) Arthritis ,    Abdominal   Peds  Hematology  (+) Blood dyscrasia, anemia   Anesthesia Other Findings   Reproductive/Obstetrics                              Anesthesia Physical Anesthesia Plan  ASA: 3  Anesthesia Plan: MAC and Regional   Post-op Pain Management:    Induction: Intravenous  PONV Risk Score and Plan: 1  Airway Management Planned: Simple Face Mask  Additional Equipment:   Intra-op Plan:   Post-operative Plan:   Informed Consent:      Dental advisory given  Plan Discussed with: CRNA and Surgeon  Anesthesia Plan Comments: (PAT note by Lynwood Hope, PA-C: 48 yo male with pertinent hx including HTN, class 3 obesity on semaglutide , polycystic kidney disease, CKD 5 not yet on HD (had left brachiocephalic fistula placed 10/2023 in anticipation).   Pt being evaluated at [facility] for renal transplant. Echo 05/2023 showed EF 60-65%, normal RV, normal valves. Nuclear stress test 05/2023 was interpreted as abnormal. In followup with cardiologist Dr. Toribio on 06/15/23 he stated, Abnormal nuclear stress test. In the course of this patient's evaluation for possible  kidney transplant, he underwent a nuclear stress test which was interpreted as abnormal. I personally reviewed the images from this test to the ability that I am able to do so (image manipulation is not facilitated with current technology in our institution). The nuclear perfusion results at rest and stress look normal to me. Pt did ultimately have a cath on 12/28/2023 showing, normal coronary angiogram of the left coronary system.  Patient reports last dose semaglutide  03/03/2024.  He will need DOS labs and eval.   EKG 06/15/23 (care everywhere): Sinus rhythm.  Rate 82.  Septal infarct, age undetermined.  Cath 12/28/2023 (Care Everywhere): DIAGNOSTIC FINDINGS:   1. Normal coronary angiogram of the left coronary system.   COMPLICATIONS: None   EBL: <25 mL   RECOMMENDATIONS:  The nuclear stress test should be regarded as a false positive. Coronary  perfusion to the LAD territory is normal.  From a cardiology standpoint, the patient can proceed with kidney  transplantation with a low risk of cardiac complications.   TTE 05/18/23 (care everywhere): SUMMARY  The left ventricular size is normal with normal left ventricular wall thickness.  Left ventricular systolic function is normal.  LV ejection fraction = 60-65%.  The right ventricle is normal in size and function.  There is no significant valvular stenosis or regurgitation.  There is no comparison study available.     )         Anesthesia Quick Evaluation

## 2024-03-12 NOTE — Progress Notes (Signed)
 SDW CALL  Patient was given pre-op  instructions over the phone. The opportunity was given for the patient to ask questions. No further questions asked. Patient verbalized understanding of instructions given.   PCP - Bascom Oley PIETY Cardiologist - Alverna Charleston Daniel,DO  PPM/ICD - denies Device Orders -  Rep Notified -   Chest x-ray - na EKG - 11/08/23 Stress Test - NM stress test 1/22/25CE ECHO - TTE1/3/25CE Cardiac Cath - 12/28/23  Sleep Study - 10/25/22 CPAP - yes  Fasting Blood Sugar - 90's Checks Blood Sugar occasionally    Check your blood sugar the morning of your surgery when you wake up and every 2 hours until you get to the Short Stay unit.  If your blood sugar is less than 70 mg/dL, you will need to treat for low blood sugar: Do not take insulin . Treat a low blood sugar (less than 70 mg/dL) with  cup of clear juice (cranberry or apple), 4 glucose tablets, OR glucose gel. Recheck blood sugar in 15 minutes after treatment (to make sure it is greater than 70 mg/dL). If your blood sugar is not greater than 70 mg/dL on recheck, call [phone] for further instructions. Report your blood sugar to the short stay nurse when you get to Short Stay.  Blood Thinner Instructions:na Aspirin  Instructions:na  ERAS Protcol -NPO PRE-SURGERY Ensure or G2-   COVID TEST- na   Anesthesia review: yes-hx HTN,smoker,on semaglutide , polycystic kidney disease, CKD IV not yet on HD. LD Semaglutide  03/03/24.  Patient denies shortness of breath, fever, cough and chest pain over the phone call   Special instructions:    Oral Hygiene is also important to reduce your risk of infection.  Remember - BRUSH YOUR TEETH THE MORNING OF SURGERY WITH YOUR REGULAR TOOTHPASTE

## 2024-03-13 ENCOUNTER — Encounter (HOSPITAL_COMMUNITY): Payer: Self-pay | Admitting: Physician Assistant

## 2024-03-13 ENCOUNTER — Ambulatory Visit (HOSPITAL_BASED_OUTPATIENT_CLINIC_OR_DEPARTMENT_OTHER): Payer: Self-pay | Admitting: Physician Assistant

## 2024-03-13 ENCOUNTER — Encounter (HOSPITAL_COMMUNITY): Payer: Self-pay | Admitting: Vascular Surgery

## 2024-03-13 ENCOUNTER — Ambulatory Visit (HOSPITAL_COMMUNITY)
Admission: RE | Admit: 2024-03-13 | Discharge: 2024-03-13 | Disposition: A | Discharge status: Home / Self Care | Attending: Vascular Surgery | Admitting: Vascular Surgery

## 2024-03-13 ENCOUNTER — Encounter (HOSPITAL_COMMUNITY): Admission: RE | Disposition: A | Payer: Self-pay | Source: Home / Self Care | Attending: Vascular Surgery

## 2024-03-13 DIAGNOSIS — Z7985 Long-term (current) use of injectable non-insulin antidiabetic drugs: Secondary | ICD-10-CM | POA: Diagnosis not present

## 2024-03-13 DIAGNOSIS — M1990 Unspecified osteoarthritis, unspecified site: Secondary | ICD-10-CM | POA: Diagnosis not present

## 2024-03-13 DIAGNOSIS — N185 Chronic kidney disease, stage 5: Secondary | ICD-10-CM | POA: Insufficient documentation

## 2024-03-13 DIAGNOSIS — T82590A Other mechanical complication of surgically created arteriovenous fistula, initial encounter: Secondary | ICD-10-CM | POA: Diagnosis not present

## 2024-03-13 DIAGNOSIS — I120 Hypertensive chronic kidney disease with stage 5 chronic kidney disease or end stage renal disease: Secondary | ICD-10-CM

## 2024-03-13 DIAGNOSIS — G4730 Sleep apnea, unspecified: Secondary | ICD-10-CM | POA: Insufficient documentation

## 2024-03-13 DIAGNOSIS — Z8249 Family history of ischemic heart disease and other diseases of the circulatory system: Secondary | ICD-10-CM | POA: Insufficient documentation

## 2024-03-13 DIAGNOSIS — T82858A Stenosis of vascular prosthetic devices, implants and grafts, initial encounter: Secondary | ICD-10-CM

## 2024-03-13 DIAGNOSIS — N186 End stage renal disease: Secondary | ICD-10-CM | POA: Diagnosis present

## 2024-03-13 DIAGNOSIS — E1122 Type 2 diabetes mellitus with diabetic chronic kidney disease: Secondary | ICD-10-CM | POA: Diagnosis not present

## 2024-03-13 DIAGNOSIS — F17210 Nicotine dependence, cigarettes, uncomplicated: Secondary | ICD-10-CM | POA: Diagnosis not present

## 2024-03-13 DIAGNOSIS — Z59868 Other specified financial insecurity: Secondary | ICD-10-CM | POA: Insufficient documentation

## 2024-03-13 DIAGNOSIS — F32A Depression, unspecified: Secondary | ICD-10-CM | POA: Insufficient documentation

## 2024-03-13 HISTORY — DX: Type 2 diabetes mellitus without complications: E11.9

## 2024-03-13 HISTORY — DX: Unspecified osteoarthritis, unspecified site: M19.90

## 2024-03-13 HISTORY — PX: FISTULA SUPERFICIALIZATION: SHX6341

## 2024-03-13 HISTORY — DX: Depression, unspecified: F32.A

## 2024-03-13 LAB — POCT I-STAT, CHEM 8
BUN: 34 mg/dL — ABNORMAL HIGH (ref 6–20)
Calcium, Ion: 1.28 mmol/L (ref 1.15–1.40)
Chloride: 113 mmol/L — ABNORMAL HIGH (ref 98–111)
Creatinine, Ser: 4.6 mg/dL — ABNORMAL HIGH (ref 0.61–1.24)
Glucose, Bld: 98 mg/dL (ref 70–99)
HCT: 32 % — ABNORMAL LOW (ref 39.0–52.0)
Hemoglobin: 10.9 g/dL — ABNORMAL LOW (ref 13.0–17.0)
Potassium: 4.5 mmol/L (ref 3.5–5.1)
Sodium: 145 mmol/L (ref 135–145)
TCO2: 20 mmol/L — ABNORMAL LOW (ref 22–32)

## 2024-03-13 LAB — GLUCOSE, CAPILLARY
Glucose-Capillary: 110 mg/dL — ABNORMAL HIGH (ref 70–99)
Glucose-Capillary: 87 mg/dL (ref 70–99)

## 2024-03-13 LAB — SURGICAL PCR SCREEN
MRSA, PCR: POSITIVE — AB
Staphylococcus aureus: POSITIVE — AB

## 2024-03-13 SURGERY — FISTULA SUPERFICIALIZATION
Anesthesia: Monitor Anesthesia Care | Laterality: Left

## 2024-03-13 MED ORDER — LIDOCAINE 2% (20 MG/ML) 5 ML SYRINGE
INTRAMUSCULAR | Status: DC | PRN
  Administered 2024-03-13: 60 mg via INTRAVENOUS

## 2024-03-13 MED ORDER — PHENYLEPHRINE HCL-NACL 20-0.9 MG/250ML-% IV SOLN
INTRAVENOUS | Status: DC | PRN
  Administered 2024-03-13: 50 ug/min via INTRAVENOUS

## 2024-03-13 MED ORDER — CHLORHEXIDINE GLUCONATE 4 % EX SOLN: 60.0000 mL | Freq: Once | CUTANEOUS | Status: DC

## 2024-03-13 MED ORDER — INSULIN ASPART 100 UNIT/ML IJ SOLN: 0.0000 [IU] | INTRAMUSCULAR | Status: DC | PRN

## 2024-03-13 MED ORDER — MIDAZOLAM HCL (PF) 2 MG/2ML IJ SOLN
INTRAMUSCULAR | Status: DC | PRN
  Administered 2024-03-13: 2 mg via INTRAVENOUS

## 2024-03-13 MED ORDER — LIDOCAINE HCL (PF) 1 % IJ SOLN
INTRAMUSCULAR | Status: DC | PRN
  Administered 2024-03-13: 5 mL

## 2024-03-13 MED ORDER — LIDOCAINE 2% (20 MG/ML) 5 ML SYRINGE
INTRAMUSCULAR | Status: AC
  Filled 2024-03-13: qty 5

## 2024-03-13 MED ORDER — PROPOFOL 1000 MG/100ML IV EMUL
INTRAVENOUS | Status: AC
  Filled 2024-03-13: qty 100

## 2024-03-13 MED ORDER — PROPOFOL 500 MG/50ML IV EMUL
INTRAVENOUS | Status: DC | PRN
  Administered 2024-03-13: 50 ug/kg/min via INTRAVENOUS

## 2024-03-13 MED ORDER — HEPARIN 6000 UNIT IRRIGATION SOLUTION
Status: DC | PRN
  Administered 2024-03-13: 1

## 2024-03-13 MED ORDER — CHLORHEXIDINE GLUCONATE 0.12 % MT SOLN
15.0000 mL | Freq: Once | OROMUCOSAL | Status: AC
  Administered 2024-03-13: 15 mL via OROMUCOSAL

## 2024-03-13 MED ORDER — SODIUM CHLORIDE 0.9 % IV SOLN: INTRAVENOUS | Status: DC

## 2024-03-13 MED ORDER — 0.9 % SODIUM CHLORIDE (POUR BTL) OPTIME
TOPICAL | Status: DC | PRN
  Administered 2024-03-13: 1000 mL

## 2024-03-13 MED ORDER — ORAL CARE MOUTH RINSE: 15.0000 mL | Freq: Once | OROMUCOSAL | Status: AC

## 2024-03-13 MED ORDER — FENTANYL CITRATE (PF) 100 MCG/2ML IJ SOLN
INTRAMUSCULAR | Status: AC
  Filled 2024-03-13: qty 2

## 2024-03-13 MED ORDER — CEFAZOLIN SODIUM-DEXTROSE 2-4 GM/100ML-% IV SOLN
2.0000 g | INTRAVENOUS | Status: AC
  Administered 2024-03-13: 2 g via INTRAVENOUS

## 2024-03-13 MED ORDER — LACTATED RINGERS IV SOLN: INTRAVENOUS | Status: DC

## 2024-03-13 MED ORDER — FENTANYL CITRATE (PF) 100 MCG/2ML IJ SOLN
INTRAMUSCULAR | Status: DC | PRN
  Administered 2024-03-13: 100 ug via INTRAVENOUS

## 2024-03-13 MED ORDER — ROPIVACAINE HCL 5 MG/ML IJ SOLN
INTRAMUSCULAR | Status: DC | PRN
Start: 2024-03-13 — End: 2024-03-13
  Administered 2024-03-13: 20 mL via PERINEURAL

## 2024-03-13 MED ORDER — MIDAZOLAM HCL 2 MG/2ML IJ SOLN
INTRAMUSCULAR | Status: AC
  Filled 2024-03-13: qty 2

## 2024-03-13 MED ORDER — HEPARIN 6000 UNIT IRRIGATION SOLUTION
Status: AC
  Filled 2024-03-13: qty 500

## 2024-03-13 MED ORDER — LIDOCAINE HCL (PF) 1 % IJ SOLN
INTRAMUSCULAR | Status: AC
  Filled 2024-03-13: qty 30

## 2024-03-13 MED ORDER — HEMOSTATIC AGENTS (NO CHARGE) OPTIME
TOPICAL | Status: DC | PRN
  Administered 2024-03-13: 1 via TOPICAL

## 2024-03-13 SURGICAL SUPPLY — 27 items
ARMBAND PINK RESTRICT EXTREMIT (MISCELLANEOUS) ×1 IMPLANT
BAG COUNTER SPONGE SURGICOUNT (BAG) ×1 IMPLANT
CANISTER SUCTION 3000ML PPV (SUCTIONS) ×1 IMPLANT
CLIP TI MEDIUM 6 (CLIP) ×1 IMPLANT
CLIP TI WIDE RED SMALL 6 (CLIP) ×1 IMPLANT
COVER PROBE W GEL 5X96 (DRAPES) ×1 IMPLANT
DERMABOND ADVANCED .7 DNX12 (GAUZE/BANDAGES/DRESSINGS) ×1 IMPLANT
ELECTRODE REM PT RTRN 9FT ADLT (ELECTROSURGICAL) ×1 IMPLANT
GLOVE BIOGEL PI IND STRL 7.0 (GLOVE) ×1 IMPLANT
GOWN STRL REUS W/ TWL LRG LVL3 (GOWN DISPOSABLE) ×2 IMPLANT
GOWN STRL REUS W/ TWL XL LVL3 (GOWN DISPOSABLE) ×1 IMPLANT
HEMOSTAT SNOW SURGICEL 2X4 (HEMOSTASIS) IMPLANT
KIT BASIN OR (CUSTOM PROCEDURE TRAY) ×1 IMPLANT
KIT TURNOVER KIT B (KITS) ×1 IMPLANT
LOOP VESSEL MINI RED (MISCELLANEOUS) IMPLANT
PACK CV ACCESS (CUSTOM PROCEDURE TRAY) ×1 IMPLANT
PAD ARMBOARD POSITIONER FOAM (MISCELLANEOUS) ×2 IMPLANT
POWDER SURGICEL 3.0 GRAM (HEMOSTASIS) IMPLANT
SLING ARM FOAM STRAP LRG (SOFTGOODS) IMPLANT
SOLN 0.9% NACL POUR BTL 1000ML (IV SOLUTION) ×1 IMPLANT
SOLN STERILE WATER BTL 1000 ML (IV SOLUTION) ×1 IMPLANT
SUT MNCRL AB 4-0 PS2 18 (SUTURE) ×1 IMPLANT
SUT PROLENE 6 0 BV (SUTURE) ×1 IMPLANT
SUT SILK 2 0 SH (SUTURE) IMPLANT
SUT VIC AB 3-0 SH 27X BRD (SUTURE) ×1 IMPLANT
TOWEL GREEN STERILE (TOWEL DISPOSABLE) ×1 IMPLANT
UNDERPAD 30X36 HEAVY ABSORB (UNDERPADS AND DIAPERS) ×1 IMPLANT

## 2024-03-13 NOTE — Anesthesia Postprocedure Evaluation (Signed)
 Anesthesia Post Note  Patient: CASIMIRO LIENHARD  Procedure(s) Performed: FISTULA SUPERFICIALIZATION (Left)     Patient location during evaluation: PACU Anesthesia Type: Regional and MAC Level of consciousness: awake Pain management: pain level controlled Vital Signs Assessment: post-procedure vital signs reviewed and stable Respiratory status: spontaneous breathing Cardiovascular status: blood pressure returned to baseline Postop Assessment: no apparent nausea or vomiting Anesthetic complications: no   No notable events documented.  Last Vitals:  Vitals:   03/13/24 1015 03/13/24 1031  BP: 135/83 133/86  Pulse: 66 70  Resp: 13 17  Temp:  36.9 C  SpO2: 92% 94%    Last Pain:  Vitals:   03/13/24 1031  TempSrc:   PainSc: 0-No pain                 Lauraine KATHEE [place]

## 2024-03-13 NOTE — Transfer of Care (Signed)
 Immediate Anesthesia Transfer of Care Note  Patient: Kevin Todd  Procedure(s) Performed: FISTULA SUPERFICIALIZATION (Left)  Patient Location: PACU  Anesthesia Type:MAC combined with regional for post-op pain  Level of Consciousness: awake and alert   Airway & Oxygen Therapy: Patient Spontanous Breathing and Patient connected to nasal cannula oxygen  Post-op Assessment: Report given to RN and Post -op Vital signs reviewed and stable  Post vital signs: Reviewed and stable  Last Vitals:  Vitals Value Taken Time  BP 113/86 03/13/24 09:53  Temp    Pulse 70 03/13/24 09:55  Resp 15 03/13/24 09:55  SpO2 94 % 03/13/24 09:55  Vitals shown include unfiled device data.  Last Pain:  Vitals:   03/13/24 0630  TempSrc:   PainSc: 6          Complications: No notable events documented.

## 2024-03-13 NOTE — Anesthesia Procedure Notes (Signed)
 Anesthesia Regional Block: Supraclavicular block   Pre-Anesthetic Checklist: , timeout performed,  Correct Patient, Correct Site, Correct Laterality,  Correct Procedure, Correct Position, site marked,  Risks and benefits discussed,  Surgical consent,  Pre-op  evaluation,  At surgeon's request and post-op pain management  Laterality: Upper and Left  Prep: chloraprep       Needles:  Injection technique: Single-shot  Needle Type: Echogenic Stimulator Needle     Needle Length: 10cm  Needle Gauge: 20     Additional Needles:   Procedures:,,,, ultrasound used (permanent image in chart),, #20gu IV placed    Narrative:  Start time: 03/13/2024 7:11 AM End time: 03/13/2024 7:11 AM Injection made incrementally with aspirations every 5 mL.  Performed by: Personally  Anesthesiologist: Waddell Lauraine NOVAK, MD

## 2024-03-13 NOTE — Op Note (Signed)
 OPERATIVE NOTE  PROCEDURE:   Left arm AV fistula revision and superficialization  PRE-OPERATIVE DIAGNOSIS: CKD 5  POST-OPERATIVE DIAGNOSIS: same as above   SURGEON: Norman GORMAN Serve MD  ASSISTANT(S): Ahmed Holster, PA  Given the complexity of the case,  the assistant was necessary in order to expedient the procedure and safely perform the technical aspects of the operation.  The assistant provided traction and countertraction to assist with exposure of the fistula proximally and distally.  They assisted with suture ligature of branches.  Their assistance was critical in the performance of the anastomosis.These skills, especially following the Prolene suture for the anastomosis, could not have been adequately performed by a scrub tech assistant.  ANESTHESIA: regional and local  ESTIMATED BLOOD LOSS: 25 cc  FINDING(S): The proximal one third of the AV fistula was appropriately dilated but moderately pulsatile.  At the mid upper arm there is a segment that branched.  The main fistula from this branch had a sclerotic segment causing the remaining proximal upper arm portion of the fistula to be small.  This was circumferentially dissected throughout the entire upper arm and we elected to then transect the fistula and spliced out the sclerotic portion.  This was retunneled more superficial and a slightly low more lateral tunnel.  There was a faint palpable thrill and a Doppler bruit on completion.  SPECIMEN(S): None  INDICATIONS:   Kevin Todd is a 48 y.o. male with CKD 5 who underwent left brachiocephalic fistula creation on 11/08/2023.  He was seen back in the office with a duplex recently and the fistula is noted to be deep with an area of high velocity in the mid upper arm.  Risks and benefits of AV fistula superficialization with possible revision were reviewed and he elected to proceed.  DESCRIPTION: After the anesthesia team performed a regional block, the patient was brought to  the operating room and positioned supine on the operating room table.  Sedation was administered and the left arm was prepped and draped in usual sterile fashion.  Preoperative antibiotics were given and a timeout was performed. We started by mapping the entire fistula in the left arm with ultrasound and this was marked over the skin.  3 skip incisions were made to circumferentially dissect the fistula.  There was a segment in the mid upper arm that was sclerotic and a branch point.  The branch was ligated and the remaining portion of the fistula after the sclerotic segment was noted to be fairly small in size.  In the proximal upper arm near the shoulder the cephalic vein was noted to be slightly more dilated.  The fistula was completely isolated and clamps were placed proximally near the anastomosis and distally up in the shoulder.  The fistula was transected at its sclerotic portion and this segment which was approximately 5 mm was displaced out.  A superficial tunnel lateral to the incisions was made in the vein was passed through this tunnel with care not to twist or kink.  The 2 ends were spatulated and anastomosis was completed with two 6-0 Prolene sutures in a quadrant fashion.  The clamps were removed and flow was restored to the fistula.  There was improvement of the thrill and the tunneled portion was noted to be superficial enough to be felt under the skin.  Hemostasis was achieved.  The skip incisions were copiously irrigated and closed in layers with 3-0 Vicryl and 4-0 Monocryl for the skin.  All counts were  correct at the end of the procedure, he tolerated the procedure well and was brought to PACU in stable condition.    COMPLICATIONS: None  CONDITION: Stable  Norman GORMAN Serve MD [facility] Phone Number: [phone] 03/13/2024 9:40 AM

## 2024-03-13 NOTE — Discharge Instructions (Signed)
   [facility]  Discharge Instructions  AV Fistula or Graft Surgery for Dialysis Access  Please refer to the following instructions for your post-procedure care. Your surgeon or physician assistant will discuss any changes with you.  Activity  You may drive the day following your surgery, if you are comfortable and no longer taking prescription pain medication. Resume full activity as the soreness in your incision resolves.  Bathing/Showering  You may shower after you go home. Keep your incision dry for 48 hours. Do not soak in a bathtub, hot tub, or swim until the incision heals completely. You may not shower if you have a hemodialysis catheter.  Incision Care  Clean your incision with mild soap and water after 48 hours. Pat the area dry with a clean towel. You do not need a bandage unless otherwise instructed. Do not apply any ointments or creams to your incision. You may have skin glue on your incision. Do not peel it off. It will come off on its own in about one week. Your arm may swell a bit after surgery. To reduce swelling use pillows to elevate your arm so it is above your heart. Your doctor will tell you if you need to lightly wrap your arm with an ACE bandage.  Diet  Resume your normal diet. There are not special food restrictions following this procedure. In order to heal from your surgery, it is CRITICAL to get adequate nutrition. Your body requires vitamins, minerals, and protein. Vegetables are the best source of vitamins and minerals. Vegetables also provide the perfect balance of protein. Processed food has little nutritional value, so try to avoid this.  Medications  Resume taking all of your medications. If your incision is causing pain, you may take over-the counter pain relievers such as acetaminophen  (Tylenol ). If you were prescribed a stronger pain medication, please be aware these medications can cause nausea and constipation. Prevent  nausea by taking the medication with a snack or meal. Avoid constipation by drinking plenty of fluids and eating foods with high amount of fiber, such as fruits, vegetables, and grains.  Do not take Tylenol  if you are taking prescription pain medications.  Follow up Your surgeon may want to see you in the office following your access surgery. If so, this will be arranged at the time of your surgery.  Please call us  immediately for any of the following conditions:  Increased pain, redness, drainage (pus) from your incision site Fever of 101 degrees or higher Severe or worsening pain at your incision site Hand pain or numbness.  Reduce your risk of vascular disease:  Stop smoking. If you would like help, call [facility] at 1-800-QUIT-NOW ((318) 492-8636) or [place] at 415-101-7299  Manage your cholesterol Maintain a desired weight Control your diabetes Keep your blood pressure down  Dialysis  It will take several weeks to several months for your new dialysis access to be ready for use. Your surgeon will determine when it is okay to use it. Your nephrologist will continue to direct your dialysis. You can continue to use your Permcath until your new access is ready for use.   03/13/2024 Kevin Todd [id number] 01-22-1976  Surgeon(s): Pearline Norman RAMAN, MD  Procedure(s): FISTULA SUPERFICIALIZATION   May stick graft immediately   May stick graft on designated area only:   x Do not stick fistula for 8 weeks    If you have any questions, please call the office at [phone].

## 2024-03-13 NOTE — H&P (Addendum)
 Patient seen and examined.  No complaints. No changes to medication history or physical exam since last seen. After discussing the risks and benefits of AVF superficialization, [name] elected to proceed.   Norman GORMAN Serve MD        Office Note        CC:  follow up Requesting Provider:  Oley Bascom GORMAN, NP   HPI: Kevin Todd is a 48 y.o. (08/08/1975) male who presents for evaluation of left brachiocephalic fistula.  This was created on 11/08/2023 by Dr. Serve.  He denies any steal symptoms in his left hand.  He is not yet on hemodialysis.  He is going on a cruise to the [place] starting tomorrow for his honeymoon.           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 04/14/2021    Procedure: Achilles tendon debridement and reconstruction, excision of Haglund;  Surgeon: Kit Rush, MD;  Location: [facility];  Service: Orthopedics;  Laterality: Right;   GASTROCNEMIUS RECESSION Right 04/14/2021    Procedure: Right gastroc recession;  Surgeon: Kit Rush, MD;  Location: [facility];  Service: Orthopedics;  Laterality: Right;          Social History         Socioeconomic History   Marital status: Single      Spouse name: Not on file   Number of children: Not on file   Years of education: Not on file   Highest education level: 12th grade  Occupational History   Not on file  Tobacco Use   Smoking status: Every Day      Average packs/day: 0.3 packs/day for 32.2 years (8.0 ttl pk-yrs)      Types: Cigarettes      Start date: 07/18/1990   Smokeless tobacco: Never   Tobacco comments:      4 cigarettes/pd recently quit 03/2021      Restarted smoking.  Quit smoking as of May  2024.  Updated 11/28/2022.  am  Vaping Use   Vaping status: Never Used  Substance and Sexual Activity   Alcohol use: Not Currently   Drug use: Yes      Comment: smokes every day. Last smoked 6/25   Sexual activity: Yes  Other Topics Concern   Not on file  Social History Narrative    Right handed    Social Drivers of Health        Financial Resource Strain: High Risk (04/30/2023)    Overall Financial Resource Strain (CARDIA)     Difficulty of Paying Living Expenses: Hard  Food Insecurity: No Food Insecurity (08/15/2023)    Hunger Vital Sign     Worried About Running Out of Food in the Last Year: Never true     Ran Out of Food in the Last Year: Never true  Transportation Needs: No Transportation Needs (04/30/2023)    PRAPARE - Therapist, Art (Medical): No     Lack of Transportation (Non-Medical): No  Physical Activity: Unknown (04/30/2023)    Exercise Vital Sign     Days of Exercise per Week: Patient declined     Minutes of Exercise per Session: 10 min  Stress: Stress Concern  Present (04/30/2023)    [employer] of Occupational Health - Occupational Stress Questionnaire     Feeling of Stress : Very much  Social Connections: Socially Isolated (04/30/2023)    Social Connection and Isolation Panel     Frequency of Communication with Friends and Family: Three times a week     Frequency of Social Gatherings with Friends and Family: Twice a week     Attends Religious Services: Never     Database Administrator or Organizations: No     Attends Engineer, Structural: Not on file     Marital Status: Never married  Intimate Partner Violence: Unknown (03/12/2023)    Received from [facility]    HITS     Physically Hurt: Not on file     Insult or Talk Down To: Not on file     Threaten Physical Harm: Not on file     Scream or Curse: Not on file           Family History  Problem Relation Age of Onset   Hypertension Mother     Colon cancer  Maternal Grandfather     Stomach cancer Neg Hx     Esophageal cancer Neg Hx     Pancreatic cancer Neg Hx                  Current Outpatient Medications  Medication Sig Dispense Refill   amLODipine  (NORVASC ) 10 MG tablet Take 1 tablet (10 mg total) by mouth daily. 30 tablet 1   atorvastatin  (LIPITOR) 80 MG tablet Take 1 tablet (80 mg total) by mouth daily. 90 tablet 3   Continuous Blood Gluc Sensor (FREESTYLE LIBRE 3 SENSOR) MISC Place 1 sensor on the skin every 14 days. Use to check glucose continuously 2 each 11   furosemide (LASIX) 40 MG tablet Take 40 mg by mouth 2 (two) times daily.       gabapentin  (NEURONTIN ) 300 MG capsule Take 300 mg by mouth 3 (three) times daily.       glucose blood (ACCU-CHEK GUIDE) test strip Use as directed to test blood sugars BID 200 strip 5   hydrALAZINE  (APRESOLINE ) 100 MG tablet Take 100 mg by mouth 2 (two) times daily.       JYNARQUE  90 & 30 MG TBPK Take 1 tablet by mouth in the morning and at bedtime.       metoprolol  succinate (TOPROL -XL) 50 MG 24 hr tablet Take 50 mg by mouth daily.       ondansetron  (ZOFRAN ) 8 MG tablet Take 8 mg by mouth 2 (two) times daily as needed.       Semaglutide , 1 MG/DOSE, (OZEMPIC , 1 MG/DOSE,) 4 MG/3ML SOPN Inject 1 mg into the skin once a week. 3 mL 2   sertraline  (ZOLOFT ) 100 MG tablet Take 1 tablet (100 mg total) by mouth daily. 30 tablet 4   sertraline  (ZOLOFT ) 50 MG tablet Take 1 tablet (50 mg total) by mouth daily. Take with 100mg  capsule for 150mg  daily today 30 tablet 5   sildenafil  (VIAGRA ) 100 MG tablet Take 0.5-1 tablets (50-100 mg total) by mouth daily as needed for erectile dysfunction. 20 tablet 3   sodium bicarbonate  650 MG tablet Take 650 mg by mouth 2 (two) times daily.       tiZANidine  (ZANAFLEX ) 4 MG tablet Take 1 tablet (4 mg total) by mouth at bedtime. 30 tablet 0   traMADol  (ULTRAM ) 50 MG tablet Take 1-2 tablets (50-100 mg  total) by mouth 2 (two) times daily as needed. 120 tablet 2   Vitamin D ,  Ergocalciferol , (DRISDOL ) 1.25 MG (50000 UNIT) CAPS capsule Take 1 capsule (50,000 Units total) by mouth every 7 (seven) days. 5 capsule 2      No current facility-administered medications for this visit.        Allergies       Allergies  Allergen Reactions   Nsaids        Kidney Disease   Other Reaction(s): Other (See Comments)   Renal Disease          REVIEW OF SYSTEMS:  Negative unless noted in HPI [X]  denotes positive finding, [ ]  denotes negative finding Cardiac   Comments:  Chest pain or chest pressure:      Shortness of breath upon exertion:      Short of breath when lying flat:      Irregular heart rhythm:             Vascular      Pain in calf, thigh, or hip brought on by ambulation:      Pain in feet at night that wakes you up from your sleep:       Blood clot in your veins:      Leg swelling:              Pulmonary      Oxygen at home:      Productive cough:       Wheezing:              Neurologic      Sudden weakness in arms or legs:       Sudden numbness in arms or legs:       Sudden onset of difficulty speaking or slurred speech:      Temporary loss of vision in one eye:       Problems with dizziness:              Gastrointestinal      Blood in stool:       Vomited blood:              Genitourinary      Burning when urinating:       Blood in urine:             Psychiatric      Major depression:              Hematologic      Bleeding problems:      Problems with blood clotting too easily:             Skin      Rashes or ulcers:             Constitutional      Fever or chills:          PHYSICAL EXAMINATION:      Vitals:    02/08/24 1014  BP: (!) 143/81  Pulse: 64  Resp: 18  Temp: 98.2 F (36.8 C)  TempSrc: Temporal  SpO2: 99%  Weight: 241 lb 14.4 oz (109.7 kg)  Height: 5' 9 (1.753 m)      General:  WDWN in NAD; vital signs documented above Gait: Not observed HENT: WNL, normocephalic Pulmonary: normal non-labored  breathing Cardiac: regular HR Abdomen: soft, NT, no masses Skin: without rashes Vascular Exam/Pulses: Palpable left radial pulse; symmetrical grip strength; palpable thrill throughout the fistula in the upper arm Musculoskeletal: no muscle  wasting or atrophy       Neurologic: A&O X 3 Psychiatric:  The pt has Normal affect.     Non-Invasive Vascular Imaging:     Patent left arm brachiocephalic fistula with large sized branching and diameter 6 mm from the skin     ASSESSMENT/PLAN:: 48 y.o. male here for follow up for evaluation of left brachiocephalic fistula   Left brachiocephalic fistula is patent with a palpable thrill.  Fortunately since last exam the fistula has matured.  It still runs about 6 mm from the surface of the skin.  In order to reliably cannulate his fistula he will require superficialization surgery.  He is not yet on hemodialysis.  He is leaving for his honeymoon tomorrow.  We will schedule him with Dr. Pearline in the next few weeks for left arm AV fistula superficialization.  Case was discussed in detail with the patient and he is agreeable to proceed.     Donnice Sender, PA-C [facility] [phone]

## 2024-03-14 ENCOUNTER — Encounter (HOSPITAL_COMMUNITY): Payer: Self-pay | Admitting: Vascular Surgery

## 2024-03-14 ENCOUNTER — Other Ambulatory Visit: Payer: Self-pay

## 2024-03-14 DIAGNOSIS — N186 End stage renal disease: Secondary | ICD-10-CM

## 2024-04-02 ENCOUNTER — Ambulatory Visit: Admitting: Behavioral Health

## 2024-04-02 DIAGNOSIS — F4323 Adjustment disorder with mixed anxiety and depressed mood: Secondary | ICD-10-CM

## 2024-04-02 DIAGNOSIS — Z7682 Awaiting organ transplant status: Secondary | ICD-10-CM
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
## 2024-04-03 ENCOUNTER — Ambulatory Visit (INDEPENDENT_AMBULATORY_CARE_PROVIDER_SITE_OTHER): Payer: Self-pay | Admitting: Nurse Practitioner

## 2024-04-03 ENCOUNTER — Telehealth: Payer: Self-pay

## 2024-04-03 ENCOUNTER — Encounter: Payer: Self-pay | Admitting: Nurse Practitioner

## 2024-04-03 VITALS — BP 150/84 | HR 66 | Wt 242.0 lb

## 2024-04-03 DIAGNOSIS — F32A Depression, unspecified: Secondary | ICD-10-CM | POA: Diagnosis not present

## 2024-04-03 DIAGNOSIS — F419 Anxiety disorder, unspecified: Secondary | ICD-10-CM

## 2024-04-03 DIAGNOSIS — N184 Chronic kidney disease, stage 4 (severe): Secondary | ICD-10-CM | POA: Diagnosis not present

## 2024-04-03 DIAGNOSIS — N529 Male erectile dysfunction, unspecified: Secondary | ICD-10-CM | POA: Diagnosis not present

## 2024-04-03 DIAGNOSIS — E1122 Type 2 diabetes mellitus with diabetic chronic kidney disease: Secondary | ICD-10-CM | POA: Diagnosis not present

## 2024-04-03 MED ORDER — HYDRALAZINE HCL 10 MG PO TABS: 10.0000 mg | ORAL_TABLET | Freq: Three times a day (TID) | ORAL | 0 refills | Status: DC

## 2024-04-03 MED ORDER — SILDENAFIL CITRATE 100 MG PO TABS: 50.0000 mg | ORAL_TABLET | Freq: Every day | ORAL | 3 refills | Status: AC | PRN

## 2024-04-03 MED ORDER — HYDROXYZINE HCL 10 MG PO TABS: 10.0000 mg | ORAL_TABLET | Freq: Three times a day (TID) | ORAL | 0 refills | Status: DC | PRN

## 2024-04-03 NOTE — Telephone Encounter (Signed)
 Pharmacy Patient Advocate Encounter  Received notification from [facility] MEDICAID that Prior Authorization for OZEMPIC  has been APPROVED from 04/02/2024 to 04/02/2025   PA #/Case ID/Reference #: [id number]

## 2024-04-03 NOTE — Progress Notes (Signed)
 Subjective   Patient ID: Kevin Todd, male    DOB: 10-Dec-1975, 48 y.o.   MRN: [id number]  Chief Complaint  Patient presents with   Diabetes   Anxiety   Depression    Referring provider: Oley Bascom RAMAN, NP  Kevin Todd is a 47 y.o. male with Past Medical History: No date: Arthritis No date: Chronic kidney disease     Comment:  ESRD No date: Depression No date: Diabetes mellitus without complication (HCC) 09/2019: Elevated creatine kinase No date: Hypertension No date: Iliac crest spur of left hip 10/2019: Insomnia No date: Pneumothorax on right No date: Polycystic kidney disease No date: Sleep apnea 09/2019: Vitamin D  deficiency   HPI  Patient presents today to follow-up on diabetes. A1C in office today is 5.4.  Patient is compliant with medications.  [facility].  Patient is currently have a hard time with anxiety and depression.  We will place a referral to psychiatry and add hydroxyzine  to use as needed for anxiety.  Denies f/c/s, n/v/d, hemoptysis, PND, leg swelling. Denies chest pain or edema.    Allergies  Allergen Reactions   Nsaids     Kidney Disease  Other Reaction(s): Other (See Comments)  Renal Disease    Immunization History  Administered Date(s) Administered   Moderna Sars-Covid-2 Vaccination 01/15/2020   PFIZER(Purple Top)SARS-COV-2 Vaccination 12/18/2019    Tobacco History: Social History   Tobacco Use  Smoking Status Every Day   Average packs/day: 0.3 packs/day for 32.2 years (8.0 ttl pk-yrs)   Types: Cigarettes   Start date: 07/18/1990  Smokeless Tobacco Never  Tobacco Comments   4 cigarettes/pd recently quit 03/2021   Restarted smoking.  Quit smoking as of May 2024.  Updated 11/28/2022.  am   Ready to quit: Not Answered Counseling given: Not Answered Tobacco comments: 4 cigarettes/pd recently quit 03/2021 Restarted smoking.  Quit smoking as of May 2024.  Updated 11/28/2022.   am   Outpatient Encounter Medications as of 04/03/2024  Medication Sig   amLODipine  (NORVASC ) 10 MG tablet Take 1 tablet (10 mg total) by mouth daily.   atorvastatin  (LIPITOR) 80 MG tablet Take 1 tablet (80 mg total) by mouth daily.   furosemide (LASIX) 40 MG tablet Take 40 mg by mouth 2 (two) times daily.   gabapentin  (NEURONTIN ) 300 MG capsule Take 300 mg by mouth 3 (three) times daily.   glucose blood (ACCU-CHEK GUIDE) test strip Use as directed to test blood sugars BID   hydrALAZINE  (APRESOLINE ) 100 MG tablet Take 100 mg by mouth 2 (two) times daily.   hydrOXYzine  (ATARAX ) 10 MG tablet Take 1 tablet (10 mg total) by mouth 3 (three) times daily as needed.   JYNARQUE  90 & 30 MG TBPK Take 1 tablet by mouth in the morning and at bedtime.   metoprolol  succinate (TOPROL -XL) 50 MG 24 hr tablet Take 50 mg by mouth daily.   ondansetron  (ZOFRAN ) 8 MG tablet Take 8 mg by mouth 2 (two) times daily as needed.   Semaglutide , 1 MG/DOSE, (OZEMPIC , 1 MG/DOSE,) 4 MG/3ML SOPN Inject 1 mg into the skin once a week.   sertraline  (ZOLOFT ) 100 MG tablet Take 1 tablet (100 mg total) by mouth daily.   sertraline  (ZOLOFT ) 50 MG tablet Take 1 tablet (50 mg total) by mouth daily. Take with 100mg  capsule for 150mg  daily today   sodium bicarbonate  650 MG tablet Take 650 mg by mouth 2 (two) times daily.   tiZANidine  (ZANAFLEX ) 4  MG tablet Take 1 tablet (4 mg total) by mouth at bedtime.   traMADol  (ULTRAM ) 50 MG tablet Take 1-2 tablets (50-100 mg total) by mouth 2 (two) times daily as needed.   Vitamin D , Ergocalciferol , (DRISDOL ) 1.25 MG (50000 UNIT) CAPS capsule Take 1 capsule (50,000 Units total) by mouth every 7 (seven) days.   [DISCONTINUED] hydrALAZINE  (APRESOLINE ) 10 MG tablet Take 1 tablet (10 mg total) by mouth 3 (three) times daily.   [DISCONTINUED] sildenafil  (VIAGRA ) 100 MG tablet Take 0.5-1 tablets (50-100 mg total) by mouth daily as needed for erectile dysfunction.   Continuous Blood Gluc Sensor (FREESTYLE  LIBRE 3 SENSOR) MISC Place 1 sensor on the skin every 14 days. Use to check glucose continuously (Patient not taking: Reported on 04/03/2024)   sildenafil  (VIAGRA ) 100 MG tablet Take 0.5-1 tablets (50-100 mg total) by mouth daily as needed for erectile dysfunction.   No facility-administered encounter medications on file as of 04/03/2024.    Review of Systems  Review of Systems  Constitutional: Negative.   HENT: Negative.    Cardiovascular: Negative.   Gastrointestinal: Negative.   Allergic/Immunologic: Negative.   Neurological: Negative.   Psychiatric/Behavioral: Negative.       Objective:   BP (!) 150/84   Pulse 66   Wt 242 lb (109.8 kg)   SpO2 100%   BMI 35.74 kg/m   Wt Readings from Last 5 Encounters:  04/03/24 242 lb (109.8 kg)  03/13/24 241 lb (109.3 kg)  03/11/24 242 lb 3.2 oz (109.9 kg)  02/08/24 241 lb 14.4 oz (109.7 kg)  01/02/24 250 lb 3.2 oz (113.5 kg)     Physical Exam Vitals and nursing note reviewed.  Constitutional:      General: He is not in acute distress.    Appearance: He is well-developed.  Cardiovascular:     Rate and Rhythm: Normal rate and regular rhythm.  Pulmonary:     Effort: Pulmonary effort is normal.     Breath sounds: Normal breath sounds.  Skin:    General: Skin is warm and dry.  Neurological:     Mental Status: He is alert and oriented to person, place, and time.       Assessment & Plan:   Type 2 diabetes mellitus with stage 4 chronic kidney disease, without long-term current use of insulin  (HCC) -     POCT glycosylated hemoglobin (Hb A1C)  Anxiety -     Ambulatory referral to Psychiatry  Depression, unspecified depression type -     Ambulatory referral to Psychiatry  Erectile dysfunction, unspecified erectile dysfunction type -     Sildenafil  Citrate; Take 0.5-1 tablets (50-100 mg total) by mouth daily as needed for erectile dysfunction.  Dispense: 60 tablet; Refill: 3 -     Ambulatory referral to Urology  Other  orders -     hydrOXYzine  HCl; Take 1 tablet (10 mg total) by mouth 3 (three) times daily as needed.  Dispense: 30 tablet; Refill: 0     Return in about 3 months (around 07/04/2024).     Bascom GORMAN Borer, NP 04/04/2024

## 2024-04-04 ENCOUNTER — Encounter: Payer: Self-pay | Admitting: Nurse Practitioner

## 2024-04-04 LAB — POCT GLYCOSYLATED HEMOGLOBIN (HGB A1C): Hemoglobin A1C: 5.4 % (ref 4.0–5.6)

## 2024-04-04 NOTE — Patient Instructions (Signed)

## 2024-04-15 ENCOUNTER — Other Ambulatory Visit: Payer: Self-pay | Admitting: Physical Medicine and Rehabilitation

## 2024-04-15 ENCOUNTER — Telehealth: Payer: Self-pay

## 2024-04-15 ENCOUNTER — Ambulatory Visit: Admitting: Behavioral Health

## 2024-04-15 MED ORDER — TRAMADOL HCL 50 MG PO TABS: 50.0000 mg | ORAL_TABLET | Freq: Two times a day (BID) | ORAL | 2 refills | Status: AC | PRN

## 2024-04-15 NOTE — Telephone Encounter (Signed)
 Please advise. Dr. Urbano is out of the office:  Patient is out of Tramadol . Will you send in a refill?

## 2024-04-17 NOTE — Progress Notes (Signed)
 Patient ID: Kevin Todd, male   DOB: February 28, 1976, 48 y.o.   MRN: [id number]  Reason for Consult: Routine Post Op   Referred by Oley Bascom RAMAN, NP  Subjective:     HPI Kevin Todd is a 48 y.o. male presenting for follow-up.  He is about 1 month s/p left arm AVF fistula revision and superficialization.  He reports his incisions are healed.  He denies any steal symptoms.  He does have some numbness tingling in his forearm kind of around the incision  Past Medical History:  Diagnosis Date   Arthritis    Chronic kidney disease    ESRD   Depression    Diabetes mellitus without complication (HCC)    Elevated creatine kinase 09/2019   Hypertension    Iliac crest spur of left hip    Insomnia 10/2019   Pneumothorax on right    Polycystic kidney disease    Sleep apnea    Vitamin D  deficiency 09/2019   Family History  Problem Relation Age of Onset   Hypertension Mother    Colon cancer Maternal Grandfather    Stomach cancer Neg Hx    Esophageal cancer Neg Hx    Pancreatic cancer Neg Hx    Past Surgical History:  Procedure Laterality Date   AV FISTULA PLACEMENT Left 11/08/2023   Procedure: BRACHIOCEPHALIC ARTERIOVENOUS (AV) FISTULA CREATION;  Surgeon: Pearline Norman RAMAN, MD;  Location: [facility] OR;  Service: Vascular;  Laterality: Left;   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 Right 04/14/2021   Procedure: Achilles tendon debridement and reconstruction, excision of Haglund;  Surgeon: Kit Rush, MD;  Location: [facility];  Service: Orthopedics;  Laterality: Right;   FISTULA SUPERFICIALIZATION Left 03/13/2024   Procedure: FISTULA SUPERFICIALIZATION;  Surgeon: Pearline Norman RAMAN, MD;  Location: [facility] OR;  Service: Vascular;  Laterality: Left;   GASTROCNEMIUS RECESSION Right 04/14/2021   Procedure: Right gastroc recession;  Surgeon: Kit Rush, MD;  Location: [facility];  Service: Orthopedics;  Laterality: Right;    Short Social History:   Social History   Tobacco Use   Smoking status: Every Day    Average packs/day: 0.3 packs/day for 32.2 years (8.0 ttl pk-yrs)    Types: Cigarettes    Start date: 07/18/1990   Smokeless tobacco: Never   Tobacco comments:    4 cigarettes/pd recently quit 03/2021    Restarted smoking.  Quit smoking as of May 2024.  Updated 11/28/2022.  am  Substance Use Topics   Alcohol use: Not Currently    Allergies  Allergen Reactions   Nsaids     Kidney Disease  Other Reaction(s): Other (See Comments)  Renal Disease    Current Outpatient Medications  Medication Sig Dispense Refill   amLODipine  (NORVASC ) 10 MG tablet Take 1 tablet (10 mg total) by mouth daily. 30 tablet 1   atorvastatin  (LIPITOR) 80 MG tablet Take 1 tablet (80 mg total) by mouth daily. 90 tablet 3   furosemide (LASIX) 40 MG tablet Take 40 mg by mouth 2 (two) times daily.     gabapentin  (NEURONTIN ) 300 MG capsule Take 300 mg by mouth 3 (three) times daily.     glucose blood (ACCU-CHEK GUIDE) test strip Use as directed to test blood sugars BID 200 strip 5   hydrALAZINE  (APRESOLINE ) 100 MG tablet Take 100 mg by mouth 2 (two) times daily.     hydrOXYzine  (ATARAX ) 10 MG tablet Take 1 tablet (10 mg total) by mouth 3 (three)  times daily as needed. 30 tablet 0   JYNARQUE  90 & 30 MG TBPK Take 1 tablet by mouth in the morning and at bedtime.     metoprolol  succinate (TOPROL -XL) 50 MG 24 hr tablet Take 50 mg by mouth daily.     ondansetron  (ZOFRAN ) 8 MG tablet Take 8 mg by mouth 2 (two) times daily as needed.     Semaglutide , 1 MG/DOSE, (OZEMPIC , 1 MG/DOSE,) 4 MG/3ML SOPN Inject 1 mg into the skin once a week. 3 mL 2   sertraline  (ZOLOFT ) 100 MG tablet Take 1 tablet (100 mg total) by mouth daily. 30 tablet 4   sertraline  (ZOLOFT ) 50 MG tablet Take 1 tablet (50 mg total) by mouth daily. Take with 100mg  capsule for 150mg  daily today 30 tablet 5   sildenafil  (VIAGRA ) 100 MG tablet Take 0.5-1 tablets (50-100 mg total) by mouth daily as needed  for erectile dysfunction. 60 tablet 3   sodium bicarbonate  650 MG tablet Take 650 mg by mouth 2 (two) times daily.     tiZANidine  (ZANAFLEX ) 4 MG tablet Take 1 tablet (4 mg total) by mouth at bedtime. 30 tablet 0   traMADol  (ULTRAM ) 50 MG tablet Take 1-2 tablets (50-100 mg total) by mouth 2 (two) times daily as needed. 120 tablet 2   Vitamin D , Ergocalciferol , (DRISDOL ) 1.25 MG (50000 UNIT) CAPS capsule Take 1 capsule (50,000 Units total) by mouth every 7 (seven) days. 5 capsule 2   No current facility-administered medications for this visit.    REVIEW OF SYSTEMS  All other systems were reviewed and are negative     Objective:  Objective   Vitals:   04/18/24 1354  BP: (!) 153/93  Pulse: 65  Resp: 18  Temp: 97.9 F (36.6 C)  TempSrc: Temporal  SpO2: 100%  Weight: 237 lb (107.5 kg)  Height: 5' 9 (1.753 m)   Body mass index is 35 kg/m.  Physical Exam General: no acute distress Cardiac: hemodynamically stable Extremities: Palpable thrill in left arm aVF, well-healed incisions  Data: Fistula duplex +--------------------+----------+-----------------+--------+  AVF                PSV (cm/s)Flow Vol (mL/min)Comments  +--------------------+----------+-----------------+--------+  Native artery inflow   115           780                 +--------------------+----------+-----------------+--------+  AVF Anastomosis        451                               +--------------------+----------+-----------------+--------+     +------------+----------+-------------+----------+--------+  OUTFLOW VEINPSV (cm/s)Diameter (cm)Depth (cm)Describe  +------------+----------+-------------+----------+--------+  Shoulder      125        0.65        0.99             +------------+----------+-------------+----------+--------+  Prox UA        129        0.66        0.32             +------------+----------+-------------+----------+--------+  Mid UA         109         0.70        0.44             +------------+----------+-------------+----------+--------+  Dist UA        168  0.74        0.56             +------------+----------+-------------+----------+--------+  AC Fossa       523        0.58        0.73             +------------+----------+-------------+----------+--------+      Assessment/Plan:   Kevin Todd is a 48 y.o. male with CKD 5.  He has not been initiated on dialysis yet.  He recently underwent revision and superficialization of his left arm radiocephalic fistula.  This appears to be healed well and maturing nicely with adequate flow volumes. He is cleared to start using the left arm brachiocephalic fistula whenever nephrology deems it necessary to start HD. Follow-up as needed   Norman GORMAN Serve MD [facility]

## 2024-04-18 ENCOUNTER — Encounter: Payer: Self-pay | Admitting: Vascular Surgery

## 2024-04-18 ENCOUNTER — Ambulatory Visit (HOSPITAL_COMMUNITY): Admission: RE | Admit: 2024-04-18 | Discharge: 2024-04-18 | Attending: Vascular Surgery

## 2024-04-18 ENCOUNTER — Ambulatory Visit (INDEPENDENT_AMBULATORY_CARE_PROVIDER_SITE_OTHER): Admitting: Vascular Surgery

## 2024-04-18 VITALS — BP 153/93 | HR 65 | Temp 97.9°F | Resp 18 | Ht 69.0 in | Wt 237.0 lb

## 2024-04-18 DIAGNOSIS — N186 End stage renal disease: Secondary | ICD-10-CM

## 2024-04-18 DIAGNOSIS — N185 Chronic kidney disease, stage 5: Secondary | ICD-10-CM

## 2024-04-21 ENCOUNTER — Telehealth: Payer: Self-pay

## 2024-04-21 NOTE — Telephone Encounter (Signed)
(  Key: [id number]) PA Case ID #: [id number] Rx #: [id number] Need Help? Call us  at [phone] Status sent iconSent to Plan today Drug traMADol  HCl 50MG  tablets ePA cloud logo Form [facility] Medicaid of [place]  Electronic Prior Authorization Request Form (2017 NCPDP) Original Claim Info 75 ACUTE PAIN MAX 5 DS. POST OP PAIN SUBMIT [facility] [id number] FOR MAX 7 DS. SUBMIT DX CODE FOR CANCER OR SICKLE CELLSubmit 3 DS For Emerg Fill with PA Type01, PA Number 1111, Level of Service 3

## 2024-05-07 ENCOUNTER — Telehealth: Payer: Self-pay | Admitting: Nurse Practitioner

## 2024-05-07 NOTE — Telephone Encounter (Signed)
 Copied from CRM [id number]. Topic: General - Other >> May 06, 2024  2:53 PM Hadassah PARAS wrote: Reason for CRM: Nurse Arnaldo from [facility] called in to notify pt's elevated depression and anxiety score. Screening score were both 13. Please advise nurse on [phone] ext [phone]

## 2024-05-07 NOTE — Telephone Encounter (Signed)
 Approval 04/21/24-10/18/24

## 2024-05-16 NOTE — Telephone Encounter (Unsigned)
 Copied from CRM [id number]. Topic: General - Other >> May 06, 2024  2:53 PM Hadassah PARAS wrote: Reason for CRM: Nurse Arnaldo from [facility] called in to notify pt's elevated depression and anxiety score. Screening score were both 13. Please advise nurse on [phone] ext [phone]

## 2024-05-16 NOTE — Telephone Encounter (Signed)
 Spoke with patient in regards to message below. Per patient he is feeling fine. He already has the contact information to the behavioral health if needed. He declined to schedule an appointment at this time states that he will call back if things worsen. No further action needed at this time. CB.

## 2024-05-20 ENCOUNTER — Encounter: Attending: Physical Medicine & Rehabilitation | Admitting: Physical Medicine & Rehabilitation

## 2024-05-20 ENCOUNTER — Encounter: Payer: Self-pay | Admitting: Physical Medicine & Rehabilitation

## 2024-05-20 VITALS — BP 150/80 | HR 71 | Ht 69.0 in | Wt 241.2 lb

## 2024-05-20 DIAGNOSIS — Z79891 Long term (current) use of opiate analgesic: Secondary | ICD-10-CM | POA: Insufficient documentation

## 2024-05-20 DIAGNOSIS — M25551 Pain in right hip: Secondary | ICD-10-CM | POA: Diagnosis not present

## 2024-05-20 DIAGNOSIS — M5442 Lumbago with sciatica, left side: Secondary | ICD-10-CM | POA: Insufficient documentation

## 2024-05-20 DIAGNOSIS — G894 Chronic pain syndrome: Secondary | ICD-10-CM | POA: Insufficient documentation

## 2024-05-20 DIAGNOSIS — Z5181 Encounter for therapeutic drug level monitoring: Secondary | ICD-10-CM | POA: Insufficient documentation

## 2024-05-20 DIAGNOSIS — M79672 Pain in left foot: Secondary | ICD-10-CM | POA: Insufficient documentation

## 2024-05-20 DIAGNOSIS — G8929 Other chronic pain: Secondary | ICD-10-CM | POA: Diagnosis present

## 2024-05-20 DIAGNOSIS — M25552 Pain in left hip: Secondary | ICD-10-CM | POA: Insufficient documentation

## 2024-05-20 DIAGNOSIS — F32A Depression, unspecified: Secondary | ICD-10-CM | POA: Diagnosis not present

## 2024-05-20 MED ORDER — BUPRENORPHINE 5 MCG/HR TD PTWK: 1.0000 | MEDICATED_PATCH | TRANSDERMAL | 0 refills | Status: AC

## 2024-05-20 NOTE — Progress Notes (Addendum)
 "  Subjective:    Patient ID: Kevin Todd, male    DOB: 31-Dec-1975, 49 y.o.   MRN: [id number]  HPI Nerve block a couple of weeks ago. Done in [place]   HPI 02/01/2023  Kevin Todd is a 49 y.o. year old male  who  has a past medical history of Arthritis, Chronic kidney disease, Depression, Diabetes mellitus without complication (HCC), Elevated creatine kinase (09/2019), Hypertension, Iliac crest spur of left hip, Insomnia (10/2019), Pneumothorax on right, Polycystic kidney disease, Sleep apnea, and Vitamin D  deficiency (09/2019).   They are presenting to PM&R clinic as a new patient for pain management evaluation. They were referred by Bascom Borer for treatment of hip pain.  Patient reports he has been having severe hip pain worsening for about 5 to 6 years.  He reports both of his hips hurt however pain is the worst in his left hip.  He also has less severe pain in his lower back.  Pain is worsened with any kind of activity.  He says he cannot sleep due to the pain.  He also cannot sit for too long without the pain worsening.  He often has to change positions.  Walking severely worsens his pain.  He has been following with [facility] and has been given gabapentin .  He is also received IA injections for his bilateral hips without significant benefit reported.  Patient reports he would like to improve his activities so that he could potentially work one day.  He will occasionally have tingling along his left thigh.  Patient reports that orthopedics told him he would have to lose weight before he could be considered for hip replacement.  Patient reports his mood is significantly decreased and would like medication to help this.  He denies SI or HI.   Red flag symptoms: No red flags for back pain endorsed in Hx or ROS  Medications tried: Topical medications denies  Nsaids kidney disease Tylenol   minimal benefit Opiates Tramdol- waiting on insurance to approve  Gabapentin  - Helps a  little  TCAs - Denies  SNRIs - Denies    Other treatments: PT/OT  Therapy heped a little TENs unit - Denies Injections  Hip cortisone did not help, appears these were intra-articular injections bilaterally Surgery  Denies   Goals for pain control: Improve his pain so he can be more active     03/01/23 Interval history Patient is here for follow-up regarding his chronic hip pain.  Pain largely unchanged since last visit.  Patient did try tramadol  and oxycodone  previously ordered by PCP.  He reports the tramadol  decreased this pain however the Percocet provided better relief.  Patient reports he only used a few tabs of the Percocet medication since he picked it up a few weeks ago.  He would like to avoid stronger narcotic medications if possible.  Discussed UDS with 83 ng/mL carboxy THC noted.  Patient reported previously that he had been using a edible CBD product.  He reports he is no longer using this product as he was told not to do this last visit.  Patient says he will no longer use this product.  We discussed that on UDS I cannot distinguish these products from marijuana.  04/10/23 Interval History  Patient is here for follow-up regarding his chronic pain.  Patient reports that tramadol  continues to help his pain however he has some days where he feels like it is not strong enough to keep his pain under control.  His  prior Percocet 5 mg tabs were better when pain is particularly severe.  He is not having any side effects with this medication.  TENS unit provides mild relief.  Patient reports he could not have hip nerve ablation due to insurance issue however he says [facility] is still working on this possibility.  06/14/23 Interval History  Patient is here for follow-up regarding treatment of his bilateral hip pain left greater than right.  Left hip continues to be the greatest location of his pain.  He also has lower back pain.  Patient reports he had procedure with [facility] that helped reduce his pain about 50% for about a week.  Patient reports that tramadol  helps reduce his pain but sometimes is not strong enough.  He has not tried using 100 mg dose of tramadol  and uses his medications sparingly.  Reports waiting to hear from [facility]    08/13/23 Interval History  Continued b/l hip pain L>R. He says he has not heard from [facility]. Tramadol  helping reduce his pain.  Reports mood has been decreased and has increased anxiety.   11/13/23 Interval History  Has been having more pain in his left hip and and bilateral lower back.  Pain in his back is largely like a band across his lower back.  Back pain has been worse for about a month.  Patient reports sometimes pain shoots down his left leg.  He had a fistula placed in L arm for potential dialysis.  He reports he going to see [facility] again for his hips and back. He wasn't able to get the nerve block completed due to busy schedule.  Patient has been using tramadol  intermittently.  Reports this does help his pain and usually takes 2 tabs at a time but tries not to use it every day.  Patient requests slightly higher  dose of Zoloft  as mood continues to be decreased at times, sometimes feels irritable.  Patient was given 10 tabs of oxycodone  for the fistula procedure.  Reports this was beneficial but makes him feel little bit off to where he usually stays in the house after he takes it.  03/11/24 Interval History  Reports that the RFA hip pain, performed after the last visit in July at [facility], provided only 1-2 weeks of relief. Pain has returned. Current pain level is 6/10, which they attribute possibly to the weather. A fall was mentioned due to his dog bumping him but appears to be unrelated to the current pain presentation and no major injury. Reports new diagnosis of arthritis in the back [facility] confirmed with x-rays showing diminished cartilage. The primary location of pain  remains the hips, not the back.  Mood is poor. This is attributed by their nephrologist to underlying kidney disease. Has an upcoming surgery on the arm for an artificial graft in preparation for potential dialysis.  Medication Review - Tramadol : Ran out of medication, reportedly due to a lock at the pharmacy preventing a refill. Prior to running out, was taking 1-2 tablets as needed, which provided some pain relief and improved function, allowing for walking and some exercise. No side effects reported. The medication helped manage the pain to a level where they could remain active. Expresses desire to continue tramadol .  05/20/24 Interval History  The patient presents for a follow-up for pain management. He reports left heel pain, he believes it may be due to heel spur. He has a history of seeing podiatry for his  feet and has an appointment scheduled.  He reports his chronic hip pain is ongoing and severe, describing it as a constant, sharp, stabbing pain that is present 24 hours a day.  Pain radiates into his groin.  The pain is worse with walking and standing. He also notes some tingling in his left thigh. He feels the pain is not yet controlled.  Patient reports tramadol  does decrease his overall pain.  Average pain about 5-6 out of 10.  He finds it helps him function better, but he still feels like he is limited in his activity. He notes that Percocet was more effective for his pain but caused more sedation. He is not currently using any THC products.  He is seeing a psychologist for mood-related concerns.   Prior UDS results:     Component Value Date/Time   LABOPIA NONE DETECTED 02/28/2016 0315   COCAINSCRNUR NONE DETECTED 02/28/2016 0315   LABBENZ NONE DETECTED 02/28/2016 0315   AMPHETMU NONE DETECTED 02/28/2016 0315   THCU POSITIVE (A) 02/28/2016 0315   LABBARB NONE DETECTED 02/28/2016 0315      Pain Inventory Average Pain 6 Pain Right Now 5 My pain is constant, sharp,  stabbing, tingling, and aching  In the last 24 hours, has pain interfered with the following? General activity 6 Relation with others 7 Enjoyment of life 3 What TIME of day is your pain at its worst? daytime and evening Sleep (in general) Poor  Pain is worse with: walking, sitting, and some activites Pain improves with: rest, heat/ice, and medication Relief from Meds: 7      Family History  Problem Relation Age of Onset   Hypertension Mother    Colon cancer Maternal Grandfather    Stomach cancer Neg Hx    Esophageal cancer Neg Hx    Pancreatic cancer Neg Hx    Social History   Socioeconomic History   Marital status: Single    Spouse name: Not on file   Number of children: Not on file   Years of education: Not on file   Highest education level: 12th grade  Occupational History   Not on file  Tobacco Use   Smoking status: Every Day    Average packs/day: 0.3 packs/day for 32.2 years (8.0 ttl pk-yrs)    Types: Cigarettes    Start date: 07/18/1990   Smokeless tobacco: Never   Tobacco comments:    4 cigarettes/pd recently quit 03/2021    Restarted smoking.  Quit smoking as of May 2024.  Updated 11/28/2022.  am  Vaping Use   Vaping status: Never Used  Substance and Sexual Activity   Alcohol use: Not Currently   Drug use: Yes    Types: Marijuana    Comment: smokes every day. Last smoked 6/25   Sexual activity: Yes  Other Topics Concern   Not on file  Social History Narrative   Right handed   Social Drivers of Health   Tobacco Use: High Risk (04/18/2024)   Patient History    Smoking Tobacco Use: Every Day    Smokeless Tobacco Use: Never    Passive Exposure: Not on file  Financial Resource Strain: High Risk (04/30/2023)   Overall Financial Resource Strain (CARDIA)    Difficulty of Paying Living Expenses: Hard  Food Insecurity: No Food Insecurity (08/15/2023)   Hunger Vital Sign    Worried About Running Out of Food in the Last Year: Never true    Ran Out of Food  in the Last Year: Never  true  Transportation Needs: No Transportation Needs (04/30/2023)   PRAPARE - Administrator, Civil Service (Medical): No    Lack of Transportation (Non-Medical): No  Physical Activity: Unknown (04/30/2023)   Exercise Vital Sign    Days of Exercise per Week: Patient declined    Minutes of Exercise per Session: 10 min  Stress: Stress Concern Present (04/30/2023)   [employer] of Occupational Health - Occupational Stress Questionnaire    Feeling of Stress : Very much  Social Connections: Socially Isolated (04/30/2023)   Social Connection and Isolation Panel    Frequency of Communication with Friends and Family: Three times a week    Frequency of Social Gatherings with Friends and Family: Twice a week    Attends Religious Services: Never    Database Administrator or Organizations: No    Attends Engineer, Structural: Not on file    Marital Status: Never married  Depression (PHQ2-9): High Risk (04/03/2024)   Depression (PHQ2-9)    PHQ-2 Score: 21  Alcohol Screen: Not on file  Housing: Unknown (04/30/2023)   Housing Stability Vital Sign    Unable to Pay for Housing in the Last Year: Patient declined    Number of Times Moved in the Last Year: Not on file    Homeless in the Last Year: No  Utilities: Not on file  Health Literacy: Not on file   Past Surgical History:  Procedure Laterality Date   AV FISTULA PLACEMENT Left 11/08/2023   Procedure: BRACHIOCEPHALIC ARTERIOVENOUS (AV) FISTULA CREATION;  Surgeon: Pearline Norman RAMAN, MD;  Location[facility] OR;  Service: Vascular;  Laterality: Left;   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 Right 04/14/2021   Procedure: Achilles tendon debridement and reconstruction, excision of Haglund;  Surgeon: Kit Rush, MD;  Location: [facility];  Service: Orthopedics;  Laterality: Right;   FISTULA SUPERFICIALIZATION Left 03/13/2024   Procedure: FISTULA SUPERFICIALIZATION;  Surgeon:  Pearline Norman RAMAN, MD;  Location: [facility] OR;  Service: Vascular;  Laterality: Left;   GASTROCNEMIUS RECESSION Right 04/14/2021   Procedure: Right gastroc recession;  Surgeon: Kit Rush, MD;  Location: [facility];  Service: Orthopedics;  Laterality: Right;   Past Medical History:  Diagnosis Date   Arthritis    Chronic kidney disease    ESRD   Depression    Diabetes mellitus without complication (HCC)    Elevated creatine kinase 09/2019   Hypertension    Iliac crest spur of left hip    Insomnia 10/2019   Pneumothorax on right    Polycystic kidney disease    Sleep apnea    Vitamin D  deficiency 09/2019   BP (!) 156/93   Pulse 71   Ht 5' 9 (1.753 m)   Wt 241 lb 3.2 oz (109.4 kg)   SpO2 98%   BMI 35.62 kg/m   Opioid Risk Score:   Fall Risk Score:  `1  Depression screen [facility] 2/9     05/20/2024   11:02 AM 04/03/2024   10:31 AM 11/13/2023    9:27 AM 08/15/2023   10:19 AM 08/13/2023   10:04 AM 03/01/2023   10:17 AM 02/01/2023   11:13 AM  Depression screen PHQ 2/9  Decreased Interest 0 3 0 1 1 0 3  Down, Depressed, Hopeless 0 3 0 1 1 0 2  PHQ - 2 Score 0 6 0 2 2 0 5  Altered sleeping  0  1   3  Tired, decreased energy  3  1   3   Change in appetite  3  0   3  Feeling bad or failure about yourself   3  0   2  Trouble concentrating  3  1   1   Moving slowly or fidgety/restless  3  0   2  Suicidal thoughts  0  0   1  PHQ-9 Score  21  5    20    Difficult doing work/chores  Somewhat difficult  Somewhat difficult   Extremely dIfficult     Data saved with a previous flowsheet row definition      Review of Systems  Musculoskeletal:  Positive for back pain.       Pain in both hips   Dialysis access in right arm  Psychiatric/Behavioral:         Depression   All other systems reviewed and are negative.      Objective:   Physical Exam   Gen: no distress, normal appearing HEENT: oral mucosa pink and moist, NCAT Chest: normal effort, normal rate of breathing Abd:  soft, non-distended Ext: no edema Psych: pleasant, normal affect Skin: intact Neuro:  RUE: 5/5 Deltoid, 5/5 Biceps, 5/5 Triceps, 5/5 Wrist Ext, 5/5 Grip LUE: 5/5 Deltoid, 5/5 Biceps, 5/5 Triceps, 5/5 Wrist Ext, 5/5 Grip RLE: HF 4+/5, KE 4+/5,  ADF 5/5, APF 5/5 LLE: HF 4+/5, KE 4+/5,  ADF 5/5, APF 5/5 -Hip and knee strength appears to be effort/pain limited  Sensory exam normal for light touch and pain in all 4 limbs.   Musculoskeletal:  Mild  TTP b/l Greater trochanters Mild lower back tenderness to palpation Hip pain with internal and external rotation left greater than right  On examination of the left foot, there is tenderness to palpation over the plantar aspect of the heel, and less severe tenderness around the arch of his foot.  Pes planus.  Prior exam Pain lower back with L-spine extension, facet loading equivocal   Excerpt from [facility] note on 05/18/2022 I did review his x-rays from May 2022 and August of 2023 (left hip). Mild degenerative changes of the left hip. No other acute osseous abnormalities.     12/12/23 Xray L spine FINDINGS: Diffuse osteosclerosis. Grade 1 L5 retrolisthesis. No compression deformities. No acute fracture or subluxation.   IMPRESSION: Osteosclerosis. Degenerative changes.    Assessment & Plan:   Assessment   1) B/L hip pain likely due to osteoarthritis left greater than right 2) CKD stage 4  -Limit use of NSAIDs, avoid nephrotoxic medications 3) Chronic lower back pain, left-sided sciatica 4) Mood disorder.  Denies SI or HI 5) Left thigh tingling 6) Obesity There is no height or weight on file to calculate BMI. 7) Left heel pain   Plan  1) Continue gabapentin  current dose as it is reported to be providing some benefit 2) continue tramadol  50 - 100mg  twice daily, Percocet was discontinued previously 3) UDS prior to visit with THC low level-this could be consistent with his report of using a locally purchased CBD compound  previously.  Patient reports he has not been using this or marijuana anytime recently. 4) Nerve ablation completed [facility] [place] -Pt reports only about 2 weeks of benefit with RFA 5) Continue tens unit for pain Zynex not covered, ordered from [employer]- Mild benefit  6) Provided list of foods that can be helpful for pain prior visit 7) Continue follow-up with psychology as directed 8) Recommend weight loss 9) Continue PDMP  monitoring, random UDS, Pill counts 10) Start Butrans  patch 5 mcg/hour, one patch applied weekly 11) Advised follow-up with podiatry, heel cord stretches.  May benefit from insoles.   Warnings Patient forgot pill counts today 7/1, he says his wife was managing his medications and he did not realize he still had pills of this, warning today, he does use medication sparingly and PDMP reviewed with only 2 refills of this medication thus far this year Patient had 10 tabs of Percocet 5 mg ordered for him surgical procedure.  Warning today that in this type of case to contact our clinic before hand for approval  Addendum Warning 1/14- Called patient THC noted, pt reported using OTC hemp product,didn't know it had THC, advised him to DC, will ask for warning letter to be sent   "

## 2024-05-22 ENCOUNTER — Telehealth: Payer: Self-pay

## 2024-05-22 LAB — DRUG TOX MONITOR 1 W/CONF, ORAL FLD
Amphetamines: NEGATIVE ng/mL
Barbiturates: NEGATIVE ng/mL
Benzodiazepines: NEGATIVE ng/mL
Buprenorphine: NEGATIVE ng/mL
Cocaine: NEGATIVE ng/mL
Codeine: NEGATIVE ng/mL
Cotinine: >250 ng/mL — ABNORMAL HIGH
Dihydrocodeine: NEGATIVE ng/mL
Fentanyl: NEGATIVE ng/mL
Heroin Metabolite: NEGATIVE ng/mL
Hydrocodone: NEGATIVE ng/mL
Hydromorphone: NEGATIVE ng/mL
MARIJUANA: POSITIVE ng/mL — AB
MDMA: NEGATIVE ng/mL
Meprobamate: NEGATIVE ng/mL
Methadone: NEGATIVE ng/mL
Morphine: NEGATIVE ng/mL
Nicotine Metabolite: POSITIVE ng/mL — AB
Norhydrocodone: NEGATIVE ng/mL
Noroxycodone: NEGATIVE ng/mL
Opiates: NEGATIVE ng/mL
Oxycodone: NEGATIVE ng/mL
Oxymorphone: NEGATIVE ng/mL
Phencyclidine: NEGATIVE ng/mL
THC: 96.4 ng/mL — ABNORMAL HIGH
Tapentadol: NEGATIVE ng/mL
Tramadol: NEGATIVE ng/mL
Zolpidem: NEGATIVE ng/mL

## 2024-05-22 LAB — DRUG TOX ALC METAB W/CON, ORAL FLD: Alcohol Metabolite: NEGATIVE ng/mL

## 2024-05-22 NOTE — Telephone Encounter (Signed)
 Tanda Bamberger (Key: [id number]) PA Case ID #: [id number] Rx #: [id number] Need Help? Call us  at [phone] Status sent iconSent to Plan today Drug Buprenorphine  5MCG/HR weekly patches ePA cloud logo Form [facility] Medicaid of [place]  Electronic Prior Authorization Request Form (2017 NCPDP) Original Claim Info 75 USE SHORT ACTING OPIOID 1ST. MAX 7 DS SUBMIT DX CODE FOR CANCER, PALLIATIVE CARE OR OTHER CHRONIC PAIN SYNDROMESubmit 3 DS For Emerg Fill with PA Type01, PA Number [id number], Level of Service 3

## 2024-05-22 NOTE — Telephone Encounter (Signed)
 Kevin Todd (Key: [id number]) PA Case ID #: [id number] Rx #: [id number] Need Help? Call us  at [phone] Outcome Approved today by [facility] Medicaid 2017 Approved. This drug has been approved. Approved quantity: 4 patches per 28 day(s). You may fill up to a 34 day supply at a retail pharmacy. You may fill up to a 90 day supply for maintenance drugs, please refer to the formulary for details. Please call the pharmacy to process your prescription claim. Effective Date: 05/22/2024 Authorization Expiration Date: 08/20/2024 Drug Buprenorphine  5MCG/HR weekly patches ePA cloud logo Form [employer] Medicaid of [place]  Electronic Prior Authorization Request Form (2017 NCPDP) Original Claim Info 75 USE SHORT ACTING OPIOID 1ST. MAX 7 DS SUBMIT DX CODE FOR CANCER, PALLIATIVE CARE OR OTHER CHRONIC PAIN SYNDROMESubmit 3 DS For Emerg Fill with PA Type01, PA Number [id number], Level of Service 3

## 2024-06-03 ENCOUNTER — Telehealth: Payer: Self-pay

## 2024-06-03 NOTE — Telephone Encounter (Signed)
 Copied from CRM [id number]. Topic: Clinical - Medical Advice >> Jun 03, 2024 10:20 AM Nessti S wrote: Reason for CRM: cherita called to inform pcp that pt has depression score of 14 and anxiety score of 11. He has an appt 1/21 to see NP CARMEN CHAPPEN in [place].

## 2024-06-10 ENCOUNTER — Other Ambulatory Visit: Payer: Self-pay | Admitting: Nurse Practitioner

## 2024-06-10 DIAGNOSIS — F419 Anxiety disorder, unspecified: Secondary | ICD-10-CM

## 2024-06-10 NOTE — Telephone Encounter (Signed)
 Please advise [facility]

## 2024-06-10 NOTE — Telephone Encounter (Signed)
 hydrOXYzine  (ATARAX ) 10 MG tablet [Pharmacy Med Name: HYDROXYZINE  HCL 10 MG TABLET]

## 2024-07-01 ENCOUNTER — Encounter: Admitting: Physical Medicine & Rehabilitation

## 2024-07-04 ENCOUNTER — Ambulatory Visit: Payer: Self-pay | Admitting: Nurse Practitioner
